# Patient Record
Sex: Male | Born: 1955 | Race: Black or African American | Hispanic: No | Marital: Married | Smoking: Never smoker
Health system: Southern US, Community
[De-identification: ages and names within clinical notes are randomized; demographics above are authoritative.]

## PROBLEM LIST (undated history)

## (undated) DIAGNOSIS — E119 Type 2 diabetes mellitus without complications: Secondary | ICD-10-CM

## (undated) DIAGNOSIS — E785 Hyperlipidemia, unspecified: Secondary | ICD-10-CM

## (undated) DIAGNOSIS — I10 Essential (primary) hypertension: Secondary | ICD-10-CM

## (undated) DIAGNOSIS — Z800 Family history of malignant neoplasm of digestive organs: Secondary | ICD-10-CM

## (undated) DIAGNOSIS — Z973 Presence of spectacles and contact lenses: Secondary | ICD-10-CM

## (undated) DIAGNOSIS — I700 Atherosclerosis of aorta: Secondary | ICD-10-CM

## (undated) DIAGNOSIS — J189 Pneumonia, unspecified organism: Secondary | ICD-10-CM

## (undated) DIAGNOSIS — E669 Obesity, unspecified: Secondary | ICD-10-CM

## (undated) HISTORY — DX: Atherosclerosis of aorta: I70.0

## (undated) HISTORY — DX: Family history of malignant neoplasm of digestive organs: Z80.0

## (undated) HISTORY — DX: Presence of spectacles and contact lenses: Z97.3

## (undated) HISTORY — DX: Essential (primary) hypertension: I10

## (undated) HISTORY — PX: PROSTATE BIOPSY: SHX241

## (undated) HISTORY — DX: Pneumonia, unspecified organism: J18.9

## (undated) HISTORY — DX: Obesity, unspecified: E66.9

## (undated) HISTORY — PX: OTHER SURGICAL HISTORY: SHX169

---

## 2012-06-07 ENCOUNTER — Ambulatory Visit
Admission: RE | Admit: 2012-06-07 | Discharge: 2012-06-07 | Disposition: A | Discharge status: Home / Self Care | Payer: BC Managed Care – PPO | Source: Ambulatory Visit | Attending: Medical | Admitting: Medical

## 2012-06-07 ENCOUNTER — Ambulatory Visit (INDEPENDENT_AMBULATORY_CARE_PROVIDER_SITE_OTHER): Payer: BC Managed Care – PPO | Admitting: Medical

## 2012-06-07 ENCOUNTER — Encounter: Payer: Self-pay | Admitting: Medical

## 2012-06-07 ENCOUNTER — Other Ambulatory Visit: Payer: Self-pay | Admitting: Medical

## 2012-06-07 ENCOUNTER — Encounter: Payer: Self-pay | Admitting: Gastroenterology

## 2012-06-07 VITALS — BP 122/88 | HR 77 | Temp 98.0°F | Resp 16 | Ht 73.5 in | Wt 352.0 lb

## 2012-06-07 DIAGNOSIS — R05 Cough: Secondary | ICD-10-CM

## 2012-06-07 DIAGNOSIS — Z111 Encounter for screening for respiratory tuberculosis: Secondary | ICD-10-CM

## 2012-06-07 DIAGNOSIS — Z125 Encounter for screening for malignant neoplasm of prostate: Secondary | ICD-10-CM

## 2012-06-07 DIAGNOSIS — Z6841 Body Mass Index (BMI) 40.0 and over, adult: Secondary | ICD-10-CM | POA: Insufficient documentation

## 2012-06-07 DIAGNOSIS — R918 Other nonspecific abnormal finding of lung field: Secondary | ICD-10-CM

## 2012-06-07 DIAGNOSIS — Z0000 Encounter for general adult medical examination without abnormal findings: Secondary | ICD-10-CM

## 2012-06-07 DIAGNOSIS — J189 Pneumonia, unspecified organism: Secondary | ICD-10-CM

## 2012-06-07 DIAGNOSIS — Z23 Encounter for immunization: Secondary | ICD-10-CM

## 2012-06-07 DIAGNOSIS — Z1211 Encounter for screening for malignant neoplasm of colon: Secondary | ICD-10-CM

## 2012-06-07 DIAGNOSIS — Z800 Family history of malignant neoplasm of digestive organs: Secondary | ICD-10-CM

## 2012-06-07 LAB — POCT URINALYSIS DIPSTICK
Bilirubin, UA: NEGATIVE
Glucose, UA: NEGATIVE
Ketones, UA: NEGATIVE
Spec Grav, UA: 1.015

## 2012-06-07 LAB — CBC WITH DIFFERENTIAL/PLATELET
Hemoglobin: 14.2 g/dL (ref 13.0–17.0)
Lymphocytes Relative: 39 % (ref 12–46)
Lymphs Abs: 2 10*3/uL (ref 0.7–4.0)
Monocytes Relative: 10 % (ref 3–12)
Neutrophils Relative %: 49 % (ref 43–77)
Platelets: 287 10*3/uL (ref 150–400)
RBC: 4.8 MIL/uL (ref 4.22–5.81)
WBC: 5 10*3/uL (ref 4.0–10.5)

## 2012-06-07 MED ORDER — CEFTRIAXONE SODIUM 1 G IJ SOLR
1.0000 g | Freq: Once | INTRAMUSCULAR | Status: AC
  Administered 2012-06-07: 1 g via INTRAMUSCULAR

## 2012-06-07 MED ORDER — LEVOFLOXACIN 500 MG PO TABS: 500.0000 mg | ORAL_TABLET | Freq: Every day | ORAL | Status: DC

## 2012-06-07 NOTE — Progress Notes (Signed)
Subjective:   HPI  [name] is a 57 y.o. male who presents for a complete physical.  New patient today.  Medical care team includes:  Primary care and opthalmology and dentist   Preventative care: Last ophthalmology visit: 2013, wear glasses Last dental visit: YES within a year Last colonoscopy: N A Last prostate exam: 2013 Last EKG:/A Last labs:n/a  Prior vaccinations: TD or Tdap:N/A Influenza:UNSURE Pneumococcal:n/a Shingles/Zostavax:n/a Other: n/a  Advanced directive:n/a Health care power of attorney:n/a Living will:n/a  Concerns: Been a foster parent 5 years.  No prior hx/o mental illness, no prior treatment for depression, no hx/o drug abuse.   Has foster form needs completing.  Denies any problems physically, mentally or emotionally that would limit his ability to take care of children.   Exercises with walking, 2mi daily, no CP, DOE, SOB.  No edema.    He reports recent illness. Thinks he had touch of the flu.  Feels 90% better.  Started 2 weeks ago.   Still having moderate cough.  No fever, no SOB, no wheezing, no sore throat, ear pain, NVD.  Using theraflu and tylenol.   Reviewed their medical, surgical, family, social, medication, and allergy history and updated chart as appropriate.   Past Medical History  Diagnosis Date  . Obesity     has been up to 400lb  . Hypertension     previous HTN with 400lb weight, not currently 05/2012  . Wears glasses     History reviewed. No pertinent past surgical history.  Family History  Problem Relation Age of Onset  . Cancer Mother 60    metastases  . Obesity Mother   . Cancer Father 15    died of colon cancer  . Liver disease Father   . Alcohol abuse Father   . Heart disease Neg Hx   . Hypertension Neg Hx   . Hyperlipidemia Neg Hx   . Stroke Neg Hx   . Diabetes Neg Hx     History   Social History  . Marital Status: Married    Spouse Name: N/A    Number of Children: N/A  . Years of Education: N/A    Occupational History  . Not on file.   Social History Main Topics  . Smoking status: Never Smoker   . Smokeless tobacco: Not on file  . Alcohol Use: Yes     Comment: occasional only  . Drug Use: No  . Sexually Active: Not on file   Other Topics Concern  . Not on file   Social History Narrative   Married, 21yo son, has 2 adopted children (special needs), and 1 foster child in the home currently. 3 children in the home currently.   Exercise with walking most mornings, walks 2 miles at a time typically    No current outpatient prescriptions on file prior to visit.    No Known Allergies   Review of Systems Constitutional: -fever, -chills, -sweats, -unexpected weight change, -decreased appetite, -fatigue Allergy: -sneezing, -itching, -congestion Dermatology: -changing moles, --rash, -lumps ENT: +runny nose, -ear pain, -sore throat, -hoarseness, -sinus pain, -teeth pain, - ringing in ears, -hearing loss, -nosebleeds Cardiology: -chest pain, -palpitations, -swelling, -difficulty breathing when lying flat, -waking up short of breath Respiratory: +cough, -shortness of breath, -difficulty breathing with exercise or exertion, -wheezing, -coughing up blood Gastroenterology: -abdominal pain, -nausea, -vomiting, -diarrhea, -constipation, -blood in stool, -changes in bowel movement, -difficulty swallowing or eating Hematology: -bleeding, -bruising  Musculoskeletal: -joint aches, -muscle aches, -joint swelling, -back pain, -  neck pain, -cramping, -changes in gait Ophthalmology: denies vision changes, eye redness, itching, discharge Urology: -burning with urination, -difficulty urinating, -blood in urine, -urinary frequency, -urgency, -incontinence Neurology: -headache, -weakness, -tingling, -numbness, -memory loss, -falls, -dizziness Psychology: -depressed mood, -agitation, -sleep problems     Objective:   Physical Exam  Filed Vitals:   06/07/12 0854  BP: 122/88  Pulse: 77  Temp: 98  F (36.7 C)  Resp: 16    General appearance: alert, no distress, WD/WN, AA male Skin:right forearm small scar from prior injury, right dorsal hand round 3mm slightly raised touch uniform lesion, unchanged for many years per pt HEENT: normocephalic, conjunctiva/corneas normal, sclerae anicteric, PERRLA, EOMi, nares patent, no discharge or erythema, pharynx normal Oral cavity: MMM, tongue normal, right upper with 1 molar and premolar in decay, rest of teeth in good repair Neck: supple, no lymphadenopathy, no thyromegaly, no masses, normal ROM, no bruits Chest: non tender, normal shape and expansion Heart: RRR, normal S1, S2, no murmurs Lungs: lower fields with crackles and decrease breath sounds, no decreased fremitus or egophony, no wheezes Abdomen: +bs, soft, non tender, non distended, no masses, no hepatomegaly, no splenomegaly, no bruits Back: non tender, normal ROM, no scoliosis Musculoskeletal: upper extremities non tender, no obvious deformity, normal ROM throughout, lower extremities non tender, no obvious deformity, normal ROM throughout Extremities: no edema, no cyanosis, no clubbing Pulses: 2+ symmetric, upper and lower extremities, normal cap refill Neurological: alert, oriented x 3, CN2-12 intact, strength normal upper extremities and lower extremities, sensation normal throughout, DTRs 2+ throughout, no cerebellar signs, gait normal Psychiatric: normal affect, behavior normal, pleasant  GU: normal male external genitalia, uncirc, nontender, no masses, no hernia, no lymphadenopathy Rectal: anus normal, prostate WNL, no nodules   Adult ECG Report  Indication: physical  Rate: 71bpm  Rhythm: normal sinus rhythm  QRS Axis: 8 degrees  PR Interval:  QRS Duration: 86ms  QTc:  Conduction Disturbances: none  Other Abnormalities: none  Patient's cardiac risk factors are: obesity (BMI >= 30 kg/m2).  EKG comparison: none  Narrative Interpretation: static baseline due to  cough, but otherwise normal EKG     Assessment and Plan :      Encounter Diagnoses  Name Primary?  . Routine general medical examination at a health care facility Yes  . Morbid obesity with BMI of 45.0-49.9, adult   . Need for Tdap vaccination   . Screen for colon cancer   . Screening for prostate cancer   . Family history of colon cancer   . PPD screening test   . Lung field abnormal finding on examination   . Cough     Physical exam - discussed healthy lifestyle, diet, exercise, preventative care, vaccinations, and addressed their concerns.  See eye doctor yearly, f/u with dentist soon for caries, repeat clean catch UA in 2wk.   Morbid obesity - discussed need to c/t efforts with diet and exercise to lose weight.   Discussed risks of obesity.  Gave info on 1800 calorie diet.  Consider Qsymia.  He will consider and let me know.  tdap vaccine, VIS and counseling given  Screen for colon cancer and family hx/o colon cancer - referral to GI for 1st colonoscopy  Screen for prostate cancer - discussed pros/cons of PSA testing and pt agrees to PSA today  PPD screening today.  Cough, abnormal lung field - send for CXR today.  94% pulse ox room air, abnormal lung fields.  Will complete foster care form  pending results of labs.    Follow-up pending labs  Addendum: chest X-ray 06/07/12 Bibasilar airspace disease, suspicious for pneumonia.    We had patient come back by for 1g IM Rocephin.  [facility] called out.  Recheck early next week.  Advised rest, hydration, call/return if not improving in the next few days.

## 2012-06-07 NOTE — Patient Instructions (Signed)
1800 Calorie Diet for Diabetes Meal Planning The 1800 calorie diet is designed for eating up to 1800 calories each day. Following this diet and making healthy meal choices can help improve overall health. This diet controls blood sugar (glucose) levels and can also help lower blood pressure and cholesterol.  SERVING SIZES Measuring foods and serving sizes helps to make sure you are getting the right amount of food. The list below tells how big or small some common serving sizes are:  1 oz.........4 stacked dice.  3 oz........Marland KitchenDeck of cards.  1 tsp.......Marland KitchenTip of little finger.  1 tbs......Marland KitchenMarland KitchenThumb.  2 tbs.......Marland KitchenGolf ball.   cup......Marland KitchenHalf of a fist.  1 cup.......Marland KitchenA fist.   GUIDELINES FOR CHOOSING FOODS The goal of this diet is to eat a variety of foods and limit calories to 1800 each day. This can be done by choosing foods that are low in calories and fat. The diet also suggests eating small amounts of food frequently. Doing this helps control your blood glucose levels so they do not get too high or too low. Each meal or snack may include a protein food source to help you feel more satisfied and to stabilize your blood glucose. Try to eat about the same amount of food around the same time each day. This includes weekend days, travel days, and days off work. Space your meals about 4 to 5 hours apart and add a snack between them if you wish.  For example, a daily food plan could include breakfast, a morning snack, lunch, dinner, and an evening snack. Healthy meals and snacks include whole grains, vegetables, fruits, lean meats, poultry, fish, and dairy products. As you plan your meals, select a variety of foods. Choose from the bread and starch, vegetable, fruit, dairy, and meat/protein groups. Examples of foods from each group and their suggested serving sizes are listed below. Use measuring cups and spoons to become familiar with what a healthy portion looks like. Bread and Starch Each  serving equals 15 grams of carbohydrates.  1 slice bread.   bagel.   cup cold cereal (unsweetened).   cup hot cereal or mashed potatoes.  1 small potato (size of a computer mouse).   cup cooked pasta or rice.   English muffin.  1 cup broth-based soup.  3 cups of popcorn.  4 to 6 whole-wheat crackers.   cup cooked beans, peas, or corn. Vegetable Each serving equals 5 grams of carbohydrates.   cup cooked vegetables.  1 cup raw vegetables.   cup tomato or vegetable juice. Fruit Each serving equals 15 grams of carbohydrates.  1 small apple or orange.  1 cup watermelon or strawberries.   cup applesauce (no sugar added).  2 tbs raisins.   banana.   cup canned fruit, packed in water, its own juice, or sweetened with a sugar substitute.   cup unsweetened fruit juice. Dairy Each serving equals 12 to 15 grams of carbohydrates.  1 cup fat-free milk.  6 oz artificially sweetened yogurt or plain yogurt.  1 cup low-fat buttermilk.  1 cup soy milk.  1 cup almond milk. Meat/Protein  1 large egg.  2 to 3 oz meat, poultry, or fish.   cup low-fat cottage cheese.  1 tbs peanut butter.  1 oz low-fat cheese.   cup tuna in water.   cup tofu. Fat  1 tsp oil.  1 tsp trans-fat-free margarine.  1 tsp butter.  1 tsp mayonnaise.  2 tbs avocado.  1 tbs salad dressing.  1  tbs cream cheese.  2 tbs sour cream.  SAMPLE 1800 CALORIE DIET PLAN Breakfast   cup unsweetened cereal (1 carb serving).  1 cup fat-free milk (1 carb serving).  1 slice whole-wheat toast (1 carb serving).   small banana (1 carb serving).  1 scrambled egg.  1 tsp trans-fat-free margarine. Lunch  Tuna sandwich.  2 slices whole-wheat bread (2 carb servings).   cup canned tuna in water, drained.  1 tbs reduced fat mayonnaise.  1 stalk celery, chopped.  2 slices tomato.  1 lettuce leaf.  1 cup carrot sticks.  24 to 30 seedless grapes (2 carb  servings).  6 oz light yogurt (1 carb serving). Afternoon Snack  3 graham cracker squares (1 carb serving).  Fat-free milk, 1 cup (1 carb serving).  1 tbs peanut butter. Dinner  3 oz salmon, broiled with 1 tsp oil.  1 cup mashed potatoes (2 carb servings) with 1 tsp trans-fat-free margarine.  1 cup fresh or frozen green beans.  1 cup steamed asparagus.  1 cup fat-free milk (1 carb serving). Evening Snack  3 cups air-popped popcorn (1 carb serving).  2 tbs parmesan cheese sprinkled on top.  MEAL PLAN Use this worksheet to help you make a daily meal plan based on the 1800 calorie diet suggestions. If you are using this plan to help you control your blood glucose, you may interchange carbohydrate-containing foods (dairy, starches, and fruits). Select a variety of fresh foods of varying colors and flavors. The total amount of carbohydrate in your meals or snacks is more important than making sure you include all of the food groups every time you eat. Choose from the following foods to build your day's meals:  8 Starches.  4 Vegetables.  3 Fruits.  2 Dairy.  6 to 7 oz Meat/Protein.  Up to 4 Fats.  Your dietician can use this worksheet to help you decide how many servings and which types of foods are right for you. BREAKFAST Food Group and Servings / Food Choice Starch ________________________________________________________ Dairy _________________________________________________________ Fruit _________________________________________________________ Meat/Protein __________________________________________________ Fat ___________________________________________________________ LUNCH Food Group and Servings / Food Choice Starch ________________________________________________________ Meat/Protein __________________________________________________ Vegetable _____________________________________________________ Fruit  _________________________________________________________ Dairy _________________________________________________________ Fat ___________________________________________________________ [name] Food Group and Servings / Food Choice Starch ________________________________________________________ Meat/Protein __________________________________________________ Fruit __________________________________________________________ Dairy _________________________________________________________ [name] Food Group and Servings / Food Choice Starch _________________________________________________________ Meat/Protein ___________________________________________________ Dairy __________________________________________________________ Vegetable ______________________________________________________ Fruit ___________________________________________________________ Fat ____________________________________________________________ [name] Food Group and Servings / Food Choice Fruit __________________________________________________________ Meat/Protein ___________________________________________________ Dairy __________________________________________________________ Starch _________________________________________________________ DAILY TOTALS Starch ____________________________ Vegetable _________________________ Fruit _____________________________ Dairy _____________________________ Meat/Protein______________________ Fat _______________________________ Document Released: 11/09/2004 Document Revised: 07/12/2011 Document Reviewed: 03/05/2011 Exit[employer] Patient Information 2013 [place], [place].   Preventative Care for Adults, Male       REGULAR HEALTH EXAMS:  A routine yearly physical is a good way to check in with your primary care provider about your health and preventive screening. It is also an opportunity to share updates about your health and any concerns you have, and receive a  thorough all-over exam.   Most health insurance companies pay for at least some preventative services.  Check with your health plan for specific coverages.  WHAT PREVENTATIVE SERVICES DO MEN NEED?  Adult men should have their weight and blood pressure checked regularly.   Men age 48 and older should have their cholesterol levels checked regularly.  Beginning at age 77 and continuing to age 60, men should be screened for colorectal cancer.  Certain people should may need  continued testing until age 56.  Other cancer screening may include exams for testicular and prostate cancer.  Updating vaccinations is part of preventative care.  Vaccinations help protect against diseases such as the flu.  Lab tests are generally done as part of preventative care to screen for anemia and blood disorders, to screen for problems with the kidneys and liver, to screen for bladder problems, to check blood sugar, and to check your cholesterol level.  Preventative services generally include counseling about diet, exercise, avoiding tobacco, drugs, excessive alcohol consumption, and sexually transmitted infections.    GENERAL RECOMMENDATIONS FOR GOOD HEALTH:  Healthy diet:  Eat a variety of foods, including fruit, vegetables, animal or vegetable protein, such as meat, fish, chicken, and eggs, or beans, lentils, tofu, and grains, such as rice.  Drink plenty of water daily.  Decrease saturated fat in the diet, avoid lots of red meat, processed foods, sweets, fast foods, and fried foods.  Exercise:  Aerobic exercise helps maintain good heart health. At least 30-40 minutes of moderate-intensity exercise is recommended. For example, a brisk walk that increases your heart rate and breathing. This should be done on most days of the week.   Find a type of exercise or a variety of exercises that you enjoy so that it becomes a part of your daily life.  Examples are running, walking, swimming, water aerobics, and  biking.  For motivation and support, explore group exercise such as aerobic class, spin class, Zumba, Yoga,or  martial arts, etc.    Set exercise goals for yourself, such as a certain weight goal, walk or run in a race such as a 5k walk/run.  Speak to your primary care provider about exercise goals.  Disease prevention:  If you smoke or chew tobacco, find out from your caregiver how to quit. It can literally save your life, no matter how long you have been a tobacco user. If you do not use tobacco, never begin.   Maintain a healthy diet and normal weight. Increased weight leads to problems with blood pressure and diabetes.   The Body Mass Index or BMI is a way of measuring how much of your body is fat. Having a BMI above 27 increases the risk of heart disease, diabetes, hypertension, stroke and other problems related to obesity. Your caregiver can help determine your BMI and based on it develop an exercise and dietary program to help you achieve or maintain this important measurement at a healthful level.  High blood pressure causes heart and blood vessel problems.  Persistent high blood pressure should be treated with medicine if weight loss and exercise do not work.   Fat and cholesterol leaves deposits in your arteries that can block them. This causes heart disease and vessel disease elsewhere in your body.  If your cholesterol is found to be high, or if you have heart disease or certain other medical conditions, then you may need to have your cholesterol monitored frequently and be treated with medication.   Ask if you should have a stress test if your history suggests this. A stress test is a test done on a treadmill that looks for heart disease. This test can find disease prior to there being a problem.  Avoid drinking alcohol in excess (more than two drinks per day).  Avoid use of street drugs. Do not share needles with anyone. Ask for professional help if you need assistance or instructions  on stopping the use of alcohol, cigarettes, and/or drugs.  Brush  your teeth twice a day with fluoride toothpaste, and floss once a day. Good oral hygiene prevents tooth decay and gum disease. The problems can be painful, unattractive, and can cause other health problems. Visit your dentist for a routine oral and dental check up and preventive care every 6-12 months.   Look at your skin regularly.  Use a mirror to look at your back. Notify your caregivers of changes in moles, especially if there are changes in shapes, colors, a size larger than a pencil eraser, an irregular border, or development of new moles.  Safety:  Use seatbelts 100% of the time, whether driving or as a passenger.  Use safety devices such as hearing protection if you work in environments with loud noise or significant background noise.  Use safety glasses when doing any work that could send debris in to the eyes.  Use a helmet if you ride a bike or motorcycle.  Use appropriate safety gear for contact sports.  Talk to your caregiver about gun safety.  Use sunscreen with a SPF (or skin protection factor) of 15 or greater.  Lighter skinned people are at a greater risk of skin cancer. Don't forget to also wear sunglasses in order to protect your eyes from too much damaging sunlight. Damaging sunlight can accelerate cataract formation.   Practice safe sex. Use condoms. Condoms are used for birth control and to help reduce the spread of sexually transmitted infections (or STIs).  Some of the STIs are gonorrhea (the clap), chlamydia, syphilis, trichomonas, herpes, HPV (human papilloma virus) and HIV (human immunodeficiency virus) which causes AIDS. The herpes, HIV and HPV are viral illnesses that have no cure. These can result in disability, cancer and death.   Keep carbon monoxide and smoke detectors in your home functioning at all times. Change the batteries every 6 months or use a model that plugs into the wall.    Vaccinations:  Stay up to date with your tetanus shots and other required immunizations. You should have a booster for tetanus every 10 years. Be sure to get your flu shot every year, since 5%-20% of the [place] population comes down with the flu. The flu vaccine changes each year, so being vaccinated once is not enough. Get your shot in the fall, before the flu season peaks.   Other vaccines to consider:  Pneumococcal vaccine to protect against certain types of pneumonia.  This is normally recommended for adults age 26 or older.  However, adults younger than 55 years old with certain underlying conditions such as diabetes, heart or lung disease should also receive the vaccine.  Shingles vaccine to protect against Varicella Zoster if you are older than age 79, or younger than 55 years old with certain underlying illness.  Hepatitis A vaccine to protect against a form of infection of the liver by a virus acquired from food.  Hepatitis B vaccine to protect against a form of infection of the liver by a virus acquired from blood or body fluids, particularly if you work in health care.  If you plan to travel internationally, check with your local health department for specific vaccination recommendations.  Cancer Screening:  Most routine colon cancer screening begins at the age of 60. On a yearly basis, doctors may provide special easy to use take-home tests to check for hidden blood in the stool. Sigmoidoscopy or colonoscopy can detect the earliest forms of colon cancer and is life saving. These tests use a small camera at the end of a  tube to directly examine the colon. Speak to your caregiver about this at age 64, when routine screening begins (and is repeated every 5 years unless early forms of pre-cancerous polyps or small growths are found).   At the age of 58 men usually start screening for prostate cancer every year. Screening may begin at a younger age for those with higher risk. Those at  higher risk include African-Americans or having a family history of prostate cancer. There are two types of tests for prostate cancer:   Prostate-specific antigen (PSA) testing. Recent studies raise questions about prostate cancer using PSA and you should discuss this with your caregiver.   Digital rectal exam (in which your doctor's lubricated and gloved finger feels for enlargement of the prostate through the anus).   Screening for testicular cancer.  Do a monthly exam of your testicles. Gently roll each testicle between your thumb and fingers, feeling for any abnormal lumps. The best time to do this is after a hot shower or bath when the tissues are looser. Notify your caregivers of any lumps, tenderness or changes in size or shape immediately.

## 2012-06-08 LAB — COMPREHENSIVE METABOLIC PANEL
ALT: 59 U/L — ABNORMAL HIGH (ref 0–53)
Albumin: 4 g/dL (ref 3.5–5.2)
Alkaline Phosphatase: 61 U/L (ref 39–117)
CO2: 27 mEq/L (ref 19–32)
Glucose, Bld: 98 mg/dL (ref 70–99)
Potassium: 4.1 mEq/L (ref 3.5–5.3)
Sodium: 139 mEq/L (ref 135–145)
Total Bilirubin: 1 mg/dL (ref 0.3–1.2)
Total Protein: 7.4 g/dL (ref 6.0–8.3)

## 2012-06-08 LAB — LIPID PANEL
Cholesterol: 125 mg/dL (ref 0–200)
LDL Cholesterol: 70 mg/dL (ref 0–99)
VLDL: 19 mg/dL (ref 0–40)

## 2012-06-08 LAB — PSA: PSA: 0.94 ng/mL (ref <=4.00)

## 2012-06-09 ENCOUNTER — Telehealth: Payer: Self-pay | Admitting: Family Medicine

## 2012-06-09 NOTE — Telephone Encounter (Signed)
Patient is aware of his appointment at [facility] on 07/11/12 @ 1000 am with Dr. Jarold Motto. CLS Nurse visit on 06/26/12 @ 1100 am. CLS   [phone]

## 2012-06-14 ENCOUNTER — Ambulatory Visit: Payer: BC Managed Care – PPO | Admitting: Medical

## 2012-06-16 ENCOUNTER — Ambulatory Visit (INDEPENDENT_AMBULATORY_CARE_PROVIDER_SITE_OTHER): Payer: BC Managed Care – PPO | Admitting: Family Medicine

## 2012-06-16 ENCOUNTER — Encounter: Payer: Self-pay | Admitting: Family Medicine

## 2012-06-16 ENCOUNTER — Ambulatory Visit: Payer: BC Managed Care – PPO | Admitting: Family Medicine

## 2012-06-16 VITALS — BP 130/84 | HR 110 | Temp 98.1°F

## 2012-06-16 DIAGNOSIS — J189 Pneumonia, unspecified organism: Secondary | ICD-10-CM

## 2012-06-16 NOTE — Progress Notes (Signed)
  Subjective:    Patient ID: [name], male    DOB: 1955-11-22, 57 y.o.   MRN: [id number]  HPI He is here for followup after recently being treated for pneumonia. He states that he is 95% better, only having slight difficulty with cough. No fever, chills, shortness of breath.   Review of Systems     Objective:   Physical Exam  Alert and in no distress. Lungs are clear to auscultation. Cardiac exam shows regular rhythm without murmurs or gallops.       Assessment & Plan:  CAP (community acquired pneumonia) he is to call me if his coughing does not go away completely.

## 2012-06-16 NOTE — Patient Instructions (Signed)
Your cough should completely go away and if it does not or you get worse, call us

## 2012-06-19 ENCOUNTER — Ambulatory Visit: Payer: BC Managed Care – PPO | Admitting: Family Medicine

## 2012-06-26 ENCOUNTER — Ambulatory Visit: Payer: BC Managed Care – PPO | Admitting: *Deleted

## 2012-06-26 VITALS — Ht 75.0 in | Wt 370.0 lb

## 2012-06-26 DIAGNOSIS — Z1211 Encounter for screening for malignant neoplasm of colon: Secondary | ICD-10-CM

## 2012-06-26 NOTE — Progress Notes (Signed)
Pt is 370 lbs.  Per Dr. Jarold Motto, pt is to see him in the office first

## 2012-07-03 ENCOUNTER — Encounter: Payer: Self-pay | Admitting: *Deleted

## 2012-07-11 ENCOUNTER — Encounter: Payer: BC Managed Care – PPO | Admitting: Gastroenterology

## 2012-07-12 ENCOUNTER — Encounter: Payer: Self-pay | Admitting: Gastroenterology

## 2012-07-12 ENCOUNTER — Other Ambulatory Visit (INDEPENDENT_AMBULATORY_CARE_PROVIDER_SITE_OTHER): Payer: BC Managed Care – PPO

## 2012-07-12 ENCOUNTER — Ambulatory Visit (INDEPENDENT_AMBULATORY_CARE_PROVIDER_SITE_OTHER): Payer: BC Managed Care – PPO | Admitting: Gastroenterology

## 2012-07-12 VITALS — BP 130/84 | HR 81 | Ht 73.0 in | Wt 364.0 lb

## 2012-07-12 DIAGNOSIS — R160 Hepatomegaly, not elsewhere classified: Secondary | ICD-10-CM

## 2012-07-12 DIAGNOSIS — R109 Unspecified abdominal pain: Secondary | ICD-10-CM

## 2012-07-12 DIAGNOSIS — Z1211 Encounter for screening for malignant neoplasm of colon: Secondary | ICD-10-CM

## 2012-07-12 LAB — FERRITIN: Ferritin: 210 ng/mL (ref 22.0–322.0)

## 2012-07-12 LAB — IBC PANEL
Iron: 96 ug/dL (ref 42–165)
Saturation Ratios: 26.6 % (ref 20.0–50.0)

## 2012-07-12 LAB — FOLATE: Folate: 10.7 ng/mL (ref >5.9)

## 2012-07-12 NOTE — Patient Instructions (Addendum)
  We will call you back once we hear back about scheduling your colonoscopy at the hospital. Once you are scheduled for the procedure you will have to come back to the office to go over your instructions for your procedure. It is a free visit and only takes about 30 minutes.  Your physician has requested that you go to the basement for lab work before leaving today.  ____________________________________________________________________________________________________________________  [name] have been scheduled for an abdominal ultrasound at [facility] (1st floor of hospital) on 07-13-2012 at 8:30 am. Please arrive 15 minutes prior to your appointment for registration. Make certain not to have anything to eat or drink 6 hours prior to your appointment. Should you need to reschedule your appointment, please contact radiology at [phone]. This test typically takes about 30 minutes to perform. ____________________________________________________________________________________________________________________                                               We are excited to introduce MyChart, a new best-in-class service that provides you online access to important information in your electronic medical record. We want to make it easier for you to view your health information - all in one secure location - when and where you need it. We expect MyChart will enhance the quality of care and service we provide.  When you register for MyChart, you can:    View your test results.    Request appointments and receive appointment reminders via email.    Request medication renewals.    View your medical history, allergies, medications and immunizations.    Communicate with your physician's office through a password-protected site.    Conveniently print information such as your medication lists.  To find out if MyChart is right for you, please talk to a member of our clinical staff today. We will  gladly answer your questions about this free health and wellness tool.  If you are age 55 or older and want a member of your family to have access to your record, you must provide written consent by completing a proxy form available at our office. Please speak to our clinical staff about guidelines regarding accounts for patients younger than age 48.  As you activate your MyChart account and need any technical assistance, please call the MyChart technical support line at [phone]) or email your question to [email or web address]. If you email your question(s), please include your name, a return phone number and the best time to reach you.  If you have non-urgent health-related questions, you can send a message to our office through MyChart at [email or web address]. If you have a medical emergency, call 911.  Thank you for using MyChart as your new health and wellness resource!   MyChart licensed from [employer],  6578-4696. Patents Pending.

## 2012-07-12 NOTE — Progress Notes (Signed)
History of Present Illness:  This is a pleasant 57 year old [place] American male referred for screening colonoscopy.  He denies any GI complaints, and has regular daily bowel movements without melena or hematochezia.  Review of recent labs from primary care shows normal CBC and metabolic profile except for SGOT of 60 and SGPT 59.  Patient has had previous tattoos but denies a known history of liver disease, hepatitis or pancreatitis.  He suffers from morbid obesity for many years.  Recent EKG also was normal.  He has not had previous endoscopy or colonoscopy.  Denies cardiovascular pulmonary complaints and has good exercise tolerance.  He denies abuse of alcohol or cigarettes.  I have reviewed this patient's present history, medical and surgical past history, allergies and medications.     ROS:   All systems were reviewed and are negative unless otherwise stated in the HPI.    Physical Exam: Blood pressure 122/88, pulse 77 and regular, and weight 352 pounds the BMI of 45.81.  94% oxygen saturation at room air. General well developed well nourished patient in no acute distress, appearing their stated age Eyes PERRLA, no icterus, fundoscopic exam per opthamologist Skin no lesions noted Neck supple, no adenopathy, no thyroid enlargement, no tenderness Chest clear to percussion and auscultation Heart no significant murmurs, gallops or rubs noted Abdomen no hepatosplenomegaly masses or tenderness, BS normal.  Abdominal exam is difficult has massive obesity, but I do think he has mild hepatomegaly without any nodularity or tenderness.  Extremities no acute joint lesions, edema, phlebitis or evidence of cellulitis. Neurologic patient oriented x 3, cranial nerves intact, no focal neurologic deficits noted. Psychological mental status normal and normal affect.  Assessment and plan: Screening colonoscopy scheduled with Dr. Martina Sinner and and in a hospital setting because of his massive morbid obesity.  I've  scheduled him for upper abdominal ultrasound, and we'll check screening labs for previous hepatitis or metabolic liver disease.  This patient should probably consider bariatric surgery for his morbid obesity.  Please copy her primary care physician, referring physician, and pertinent subspecialists.Marland KitchenMarland KitchenMoses Manners and Dr. Ruthe Mannan  Encounter Diagnoses  Name Primary?  . Abdominal pain, unspecified site Yes  . Enlarged liver

## 2012-07-13 ENCOUNTER — Ambulatory Visit (HOSPITAL_COMMUNITY)
Admission: RE | Admit: 2012-07-13 | Discharge: 2012-07-13 | Disposition: A | Discharge status: Home / Self Care | Payer: BC Managed Care – PPO | Source: Ambulatory Visit | Attending: Gastroenterology | Admitting: Gastroenterology

## 2012-07-13 DIAGNOSIS — R160 Hepatomegaly, not elsewhere classified: Secondary | ICD-10-CM

## 2012-07-13 DIAGNOSIS — R109 Unspecified abdominal pain: Secondary | ICD-10-CM | POA: Insufficient documentation

## 2012-07-31 ENCOUNTER — Telehealth: Payer: Self-pay | Admitting: Gastroenterology

## 2012-07-31 ENCOUNTER — Other Ambulatory Visit: Payer: Self-pay | Admitting: Gastroenterology

## 2012-07-31 DIAGNOSIS — Z1211 Encounter for screening for malignant neoplasm of colon: Secondary | ICD-10-CM

## 2012-07-31 MED ORDER — PEG-KCL-NACL-NASULF-NA ASC-C 100 G PO SOLR: 1.0000 | Freq: Once | ORAL | Status: DC

## 2012-07-31 NOTE — Telephone Encounter (Signed)
Spoke to pt. Told him colonoscopy is scheduled with Dr. Rhea Belton on 09/01/2012 @ 8:30am at [facility] we went over instructions and told him to pick up his prep i sent in to his pharmacy and when he receives the instructions in the mail to call me back with any questions he may have. Pt stated understadning.

## 2012-08-14 ENCOUNTER — Telehealth: Payer: Self-pay | Admitting: Gastroenterology

## 2012-08-14 NOTE — Telephone Encounter (Deleted)
Pt called in to let me know he went to pick up his Prep for his colonoscopy scheduled for 09/01/2012 @

## 2012-08-14 NOTE — Telephone Encounter (Signed)
Pt called in to tell me he could not afford the prep for his colonoscopy that is scheduled on 09/01/2012 @ [facility] with Dr. Rhea Belton.  told him I could give him a coupon for a free prep. He was very appreciative and said he would be by tomorrow to pick it up, I told him I will leave it at our front desk.

## 2012-08-15 ENCOUNTER — Encounter (HOSPITAL_COMMUNITY): Payer: Self-pay | Admitting: *Deleted

## 2012-08-31 ENCOUNTER — Telehealth: Payer: Self-pay | Admitting: Gastroenterology

## 2012-08-31 NOTE — Telephone Encounter (Signed)
Pt called and cancelled his colonoscopy scheduled at [facility] for 09/01/2012 because he forgot to stop eating salads and nuts. Pt stated he is on a diet and that's is all he eats. I told him since he will be on clears all today and starting his prep this evening that it will be fine. Pt said "no" he wants to do this right and will reschedule. I told him I'll call him back to rescheduled based on Dr. Sherald Barge hospital schedule. Pt verbalized understanding

## 2012-09-01 ENCOUNTER — Ambulatory Visit (HOSPITAL_COMMUNITY)
Admission: RE | Admit: 2012-09-01 | Payer: BC Managed Care – PPO | Source: Ambulatory Visit | Admitting: Internal Medicine

## 2012-09-01 SURGERY — COLONOSCOPY
Anesthesia: Monitor Anesthesia Care

## 2014-04-11 ENCOUNTER — Ambulatory Visit (INDEPENDENT_AMBULATORY_CARE_PROVIDER_SITE_OTHER): Payer: BC Managed Care – PPO | Admitting: Medical

## 2014-04-11 ENCOUNTER — Encounter: Payer: Self-pay | Admitting: Medical

## 2014-04-11 VITALS — BP 190/110 | HR 78 | Temp 97.8°F | Resp 16 | Ht 75.0 in | Wt 380.0 lb

## 2014-04-11 DIAGNOSIS — Z125 Encounter for screening for malignant neoplasm of prostate: Secondary | ICD-10-CM

## 2014-04-11 DIAGNOSIS — I10 Essential (primary) hypertension: Secondary | ICD-10-CM

## 2014-04-11 DIAGNOSIS — Z23 Encounter for immunization: Secondary | ICD-10-CM

## 2014-04-11 DIAGNOSIS — K029 Dental caries, unspecified: Secondary | ICD-10-CM

## 2014-04-11 DIAGNOSIS — Z1211 Encounter for screening for malignant neoplasm of colon: Secondary | ICD-10-CM

## 2014-04-11 DIAGNOSIS — E786 Lipoprotein deficiency: Secondary | ICD-10-CM

## 2014-04-11 DIAGNOSIS — Z0000 Encounter for general adult medical examination without abnormal findings: Secondary | ICD-10-CM

## 2014-04-11 LAB — HEPATIC FUNCTION PANEL
ALBUMIN: 4.2 g/dL (ref 3.5–5.2)
ALT: 19 U/L (ref 0–53)
AST: 25 U/L (ref 0–37)
Alkaline Phosphatase: 73 U/L (ref 39–117)
BILIRUBIN INDIRECT: 0.5 mg/dL (ref 0.2–1.2)
Bilirubin, Direct: 0.2 mg/dL (ref 0.0–0.3)
TOTAL PROTEIN: 7.6 g/dL (ref 6.0–8.3)
Total Bilirubin: 0.7 mg/dL (ref 0.2–1.2)

## 2014-04-11 LAB — CBC WITH DIFFERENTIAL/PLATELET
BASOS ABS: 0 10*3/uL (ref 0.0–0.1)
BASOS PCT: 0 % (ref 0–1)
EOS ABS: 0.1 10*3/uL (ref 0.0–0.7)
EOS PCT: 2 % (ref 0–5)
HCT: 39.5 % (ref 39.0–52.0)
HEMOGLOBIN: 13.9 g/dL (ref 13.0–17.0)
LYMPHS ABS: 2.5 10*3/uL (ref 0.7–4.0)
Lymphocytes Relative: 41 % (ref 12–46)
MCH: 30.2 pg (ref 26.0–34.0)
MCHC: 35.2 g/dL (ref 30.0–36.0)
MCV: 85.7 fL (ref 78.0–100.0)
MPV: 9.7 fL (ref 9.4–12.4)
Monocytes Absolute: 0.5 10*3/uL (ref 0.1–1.0)
Monocytes Relative: 8 % (ref 3–12)
NEUTROS PCT: 49 % (ref 43–77)
Neutro Abs: 3 10*3/uL (ref 1.7–7.7)
PLATELETS: 257 10*3/uL (ref 150–400)
RBC: 4.61 MIL/uL (ref 4.22–5.81)
RDW: 13.3 % (ref 11.5–15.5)
WBC: 6.1 10*3/uL (ref 4.0–10.5)

## 2014-04-11 LAB — POCT URINALYSIS DIPSTICK
Bilirubin, UA: NEGATIVE
Glucose, UA: NEGATIVE
KETONES UA: NEGATIVE
Leukocytes, UA: NEGATIVE
Nitrite, UA: NEGATIVE
PROTEIN UA: NEGATIVE
RBC UA: NEGATIVE
pH, UA: 6

## 2014-04-11 LAB — BASIC METABOLIC PANEL
BUN: 15 mg/dL (ref 6–23)
CHLORIDE: 106 meq/L (ref 96–112)
CO2: 24 meq/L (ref 19–32)
CREATININE: 0.96 mg/dL (ref 0.50–1.35)
Calcium: 9.4 mg/dL (ref 8.4–10.5)
Glucose, Bld: 91 mg/dL (ref 70–99)
Potassium: 4.2 mEq/L (ref 3.5–5.3)
Sodium: 138 mEq/L (ref 135–145)

## 2014-04-11 LAB — LIPID PANEL
CHOL/HDL RATIO: 2.9 ratio
Cholesterol: 128 mg/dL (ref 0–200)
HDL: 44 mg/dL (ref >39)
LDL CALC: 71 mg/dL (ref 0–99)
TRIGLYCERIDES: 66 mg/dL (ref <150)
VLDL: 13 mg/dL (ref 0–40)

## 2014-04-11 LAB — TSH: TSH: 1.959 u[IU]/mL (ref 0.350–4.500)

## 2014-04-11 NOTE — Progress Notes (Signed)
Subjective:   HPI  Ryan O Drewes [name] is a 58 y.o. male who presents for a complete physical.   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   Preventative care:2 years ago, here Last ophthalmology visit:2 years ago Last dental visit:Dr. Cooper 2014 Last colonoscopy:never Last prostate exam: 2 years ago Last EKG:unknown Last labs:2 years   Prior vaccinations: TD or Tdap: up to date Influenza:would like to have it today Pneumococcal:no Shingles/Zostavax:no  Advanced directive no Health care power of attorney no Living will:no  Concerns: No concerns other than he knows he needs to lose weight  Reviewed their medical, surgical, family, social, medication, and allergy history and updated chart as appropriate.  Past Medical History  Diagnosis Date  . Obesity     has been up to 400lb  . Wears glasses   . Pneumonia   . Family history of colon cancer     father -questionable  . Hypertension     previous HTN with 400lb weight    Past Surgical History  Procedure Laterality Date  . Unremarkable      History   Social History  . Marital Status: Married    Spouse Name: N/A    Number of Children: N/A  . Years of Education: N/A   Occupational History  . Not on file.   Social History Main Topics  . Smoking status: Never Smoker   . Smokeless tobacco: Never Used  . Alcohol Use: Yes     Comment: occasional only  . Drug Use: No  . Sexual Activity: Not on file   Other Topics Concern  . Not on file   Social History Narrative   Married, son in college, has 5 adopted children, some special needs ,some planning to adopt, Exercise with walking most mornings, walks 2 miles at a time typically. Works at [employer].    Family History  Problem Relation Age of Onset  . Cancer Mother 60    metastases-unaware as to type of ca  . Obesity Mother   . Colon cancer Father 60    questionable  . Liver disease Father   . Alcohol abuse Father   . Heart  disease Neg Hx   . Hypertension Neg Hx   . Hyperlipidemia Neg Hx   . Stroke Neg Hx   . Diabetes Neg Hx   . Esophageal cancer Neg Hx   . Rectal cancer Neg Hx     Current outpatient prescriptions: peg 3350 powder (MOVIPREP) 100 G SOLR, Take 1 kit (100 g total) by mouth once. (Patient not taking: Reported on 04/11/2014), Disp: 1 kit, Rfl: 0  No Known Allergies     Review of Systems Constitutional: -fever, -chills, -sweats, -unexpected weight change, -decreased appetite, -fatigue Allergy: -sneezing, -itching, -congestion Dermatology: -changing moles, --rash, -lumps ENT: -runny nose, -ear pain, -sore throat, -hoarseness, -sinus pain, -teeth pain, - ringing in ears, -hearing loss, -nosebleeds Cardiology: -chest pain, -palpitations, -swelling, -difficulty breathing when lying flat, -waking up short of breath Respiratory: -cough, -shortness of breath, -difficulty breathing with exercise or exertion, -wheezing, -coughing up blood Gastroenterology: -abdominal pain, -nausea, -vomiting, -diarrhea, -constipation, -blood in stool, -changes in bowel movement, -difficulty swallowing or eating Hematology: -bleeding, -bruising  Musculoskeletal: -joint aches, -muscle aches, -joint swelling, -back pain, -neck pain, -cramping, -changes in gait Ophthalmology: denies vision changes, eye redness, itching, discharge Urology: -burning with urination, -difficulty urinating, -blood in urine, -urinary frequency, -urgency, -incontinence Neurology: -headache, -weakness, -tingling, -numbness, -memory loss, -falls, -dizziness Psychology: -depressed mood, -  agitation, -sleep problems     Objective:   Physical Exam  BP 190/110 mmHg  Pulse 78  Temp(Src) 97.8 F (36.6 C) (Oral)  Resp 16  Ht 6' 3" (1.905 m)  Wt 380 lb (172.367 kg)  BMI 47.50 kg/m2  BP Readings from Last 3 Encounters:  04/11/14 190/110  07/12/12 130/84  06/16/12 130/84   Wt Readings from Last 3 Encounters:  04/11/14 380 lb (172.367 kg)   07/12/12 364 lb (165.109 kg)  06/26/12 370 lb (167.831 kg)   General appearance: alert, no distress, WD/WN, AA male Skin:right forearm small scar from prior injury, right dorsal hand round 59m slightly raised uniform lesion, unchanged for many years per pt HEENT: normocephalic, conjunctiva/corneas normal, sclerae anicteric, PERRLA, EOMi, nares patent, no discharge or erythema, pharynx normal Oral cavity: MMM, tongue normal, several teeth with decay Neck: supple, no lymphadenopathy, no thyromegaly, no masses, normal ROM, no bruits Chest: non tender, normal shape and expansion Heart: RRR, normal S1, S2, no murmurs Lungs: clear Abdomen: +bs, soft, non tender, non distended, no masses, no hepatomegaly, no splenomegaly, no bruits Back: non tender, normal ROM, no scoliosis Musculoskeletal: upper extremities non tender, no obvious deformity, normal ROM throughout, lower extremities non tender, no obvious deformity, normal ROM throughout Extremities: no edema, no cyanosis, no clubbing Pulses: 2+ symmetric, upper and lower extremities, normal cap refill Neurological: alert, oriented x 3, CN2-12 intact, strength normal upper extremities and lower extremities, sensation normal throughout, DTRs 2+ throughout, no cerebellar signs, gait normal Psychiatric: normal affect, behavior normal, pleasant  GU: normal male external genitalia, uncirc, nontender, no masses, no hernia, no lymphadenopathy Rectal: deferred  Assessment and Plan :    Encounter Diagnoses  Name Primary?  . Annual physical exam Yes  . Morbid obesity   . Essential hypertension   . Low HDL (under 40)   . Need for prophylactic vaccination and inoculation against influenza   . Screening for prostate cancer   . Special screening for malignant neoplasms, colon   . Tooth decay     Physical exam - discussed healthy lifestyle, diet, exercise, preventative care, vaccinations, and addressed their concerns.   See your eye doctor yearly for  routine vision care. See your dentist yearly for routine dental care including hygiene visits twice yearly. Morbid obesity - stressed the need for lifestyle chagnes, better efforts.   Will refer to nutritionist HTN - pending labs will being medication.   Discussed diagnosis, dangers of uncontrolled hypertesiuon Low HDL - labs today Counseled on the influenza virus vaccine.  Vaccine information sheet given.  Influenza vaccine given after consent obtained. PSA test today He declined referral to colonoscopy until after first of the year.  Follow-up pending labs

## 2014-04-12 LAB — MICROALBUMIN / CREATININE URINE RATIO
Creatinine, Urine: 214.5 mg/dL
MICROALB UR: 0.8 mg/dL (ref <2.0)
Microalb Creat Ratio: 3.7 mg/g (ref 0.0–30.0)

## 2014-04-12 LAB — HEMOGLOBIN A1C
Hgb A1c MFr Bld: 5.9 % — ABNORMAL HIGH (ref <5.7)
Mean Plasma Glucose: 123 mg/dL — ABNORMAL HIGH (ref <117)

## 2014-04-13 LAB — PSA: PSA: 2.79 ng/mL (ref <=4.00)

## 2014-04-15 ENCOUNTER — Other Ambulatory Visit: Payer: Self-pay | Admitting: Medical

## 2014-04-15 MED ORDER — LOSARTAN POTASSIUM-HCTZ 50-12.5 MG PO TABS: 1.0000 | ORAL_TABLET | Freq: Every day | ORAL | Status: DC

## 2014-05-30 ENCOUNTER — Ambulatory Visit: Payer: Self-pay | Admitting: Medical

## 2014-12-27 ENCOUNTER — Telehealth: Payer: Self-pay | Admitting: Medical

## 2014-12-27 ENCOUNTER — Other Ambulatory Visit: Payer: Self-pay | Admitting: Medical

## 2014-12-27 MED ORDER — LOSARTAN POTASSIUM-HCTZ 50-12.5 MG PO TABS: 1.0000 | ORAL_TABLET | Freq: Every day | ORAL | Status: DC

## 2014-12-27 NOTE — Telephone Encounter (Signed)
Pt called and made an appt for bp follow up. Please refill bp meds to [address]

## 2014-12-27 NOTE — Telephone Encounter (Signed)
done

## 2015-01-07 ENCOUNTER — Ambulatory Visit (INDEPENDENT_AMBULATORY_CARE_PROVIDER_SITE_OTHER): Payer: BLUE CROSS/BLUE SHIELD | Admitting: Medical

## 2015-01-07 ENCOUNTER — Encounter: Payer: Self-pay | Admitting: Medical

## 2015-01-07 VITALS — BP 150/100 | HR 76 | Temp 97.9°F | Resp 18 | Wt 385.4 lb

## 2015-01-07 DIAGNOSIS — R972 Elevated prostate specific antigen [PSA]: Secondary | ICD-10-CM

## 2015-01-07 DIAGNOSIS — E786 Lipoprotein deficiency: Secondary | ICD-10-CM | POA: Diagnosis not present

## 2015-01-07 DIAGNOSIS — I10 Essential (primary) hypertension: Secondary | ICD-10-CM | POA: Diagnosis not present

## 2015-01-07 DIAGNOSIS — Z6841 Body Mass Index (BMI) 40.0 and over, adult: Secondary | ICD-10-CM

## 2015-01-07 LAB — BASIC METABOLIC PANEL
BUN: 19 mg/dL (ref 7–25)
CALCIUM: 9.9 mg/dL (ref 8.6–10.3)
CO2: 28 mmol/L (ref 20–31)
Chloride: 100 mmol/L (ref 98–110)
Creat: 1.06 mg/dL (ref 0.70–1.33)
Glucose, Bld: 87 mg/dL (ref 65–99)
Potassium: 4.7 mmol/L (ref 3.5–5.3)
SODIUM: 139 mmol/L (ref 135–146)

## 2015-01-07 MED ORDER — PHENTERMINE-TOPIRAMATE ER 3.75-23 MG PO CP24: 1.0000 | ORAL_CAPSULE | ORAL | Status: DC

## 2015-01-07 MED ORDER — PHENTERMINE-TOPIRAMATE ER 7.5-46 MG PO CP24: 1.0000 | ORAL_CAPSULE | ORAL | Status: DC

## 2015-01-07 MED ORDER — PRAVASTATIN SODIUM 20 MG PO TABS: 20.0000 mg | ORAL_TABLET | Freq: Every day | ORAL | Status: DC

## 2015-01-07 MED ORDER — ASPIRIN EC 81 MG PO TBEC: 81.0000 mg | DELAYED_RELEASE_TABLET | Freq: Every day | ORAL | Status: DC

## 2015-01-07 MED ORDER — LOSARTAN POTASSIUM-HCTZ 100-12.5 MG PO TABS: 1.0000 | ORAL_TABLET | Freq: Every day | ORAL | Status: DC

## 2015-01-07 NOTE — Patient Instructions (Signed)
Encounter Diagnoses  Name Primary?  . Essential hypertension Yes  . Low HDL (under 40)   . Morbid obesity with BMI of 45.0-49.9, adult   . Abnormal PSA     Recommendations:  We will refer you for nutrition counseling  Continue Losartan HCT, but I increased the dose  Begin Pravachol and baby aspirin daily at bedtime to lower cholesterol and thus lower heart disease risk  Diet  Increase your water intake, get at least 64 ounces of water daily  Eat 3-4 fruits daily  Eat plenty of vegetables throughout the day, preferably each meal  Eat good sources of grains such as oatmeal, barley, whole grain pasta, whole grain bread, but limit the serving size to 1 cup of oatmeal or pasta per meal or 2 slices of bread per meal  We don't need to meat at each meal, however if you do eat meat, limit serving size to the size of your palm, and eat chicken fish or [place], lean cuts of meat  Eat beans every day as this is a good nutrient source and helps to curb appetite  Consider using a program such as Weight Watchers  Consider using a Smart phone app such as My Fitness PAL or [employer] to track your calories and progress   Things to limit or avoid:  Avoid fast food, fried foods, fatty foods  Limit sweets, ice cream, cake and other baked goods  Avoid soda, beer, alcohol, sweet tea  Exercise  You need to be exercising most days of the week for 30-45 minutes or more  Good forms of exercise include walking, hiking, stationary bike or bicycling outside, lap swimming, aerobics class, dance, Zumba  Consider getting a trainer at a gym to help with exercise  Medication  Begin Qsymia weight loss medication  Start by taking the Qsymia 3.75/23 mg dose, once daily in the morning before breakfast for the first 2 weeks  Then increase to the Qsymia 7.5/46mg  dose, once daily in the morning before breakfast  You will need to use the coupon card to call and activate the medication  If your  insurance does not cover the weight loss medicine listed above, check on the insurance coverage for the following medications:  Belviq  Contrave  Consider weighing yourself daily to keep track of your weight  Returnin 4- 6 weeks for recheck on weightr loss medication  Medication costs:  If you get to the pharmacy and medication prescribed today was either too expensive, not covered by your insurance, or required prior authorization, then please call us back to let us know.  We often have no way to know if a medication is too expensive or not covered by your insurance.  Thanks for your cooperation.

## 2015-01-07 NOTE — Progress Notes (Signed)
Subjective: Chief Complaint  Patient presents with  . BP check    [name]. is a 59 y.o. male who presents follow up on BP, weight.   Hypertension - Here for follow-up of hypertension.  Was due back months ago, but he is just now coming back per my request.   He is exercising some.   He is adherent to a low-salt diet.  Blood pressure is somewhat improved.   Exercise is limited by him taking caring of his foster children, several of which are special needs.  Has 2 special needs children, 3 total foster children at home.  Cardiac symptoms - none, denies chest pain, claudication, dyspnea, fatigue and lower extremity edema.  Cardiovascular risk factors: advanced age (older than 65 for men, 58 for women), hypertension, male gender and obesity (BMI >= 30 kg/m2). Use of agents associated with hypertension: none. History of target organ damage: none.     Obesity -not losing weight.  Eating relatively healthy, getting some walking.  Limited by schedule.  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   Wants to see if medication can help him get a jump on weight loss  PSA had increased significantly from prior on labs earlier this year.  Denies any prostate symptoms.    No family hx/o prostate cancer or heart disease  No other aggravating or relieving factors.    No other c/o.  The following portions of the patient's history were reviewed and updated as appropriate: allergies, current medications, past family history, past medical history, past social history, past surgical history and problem list.  ROS Otherwise as in subjective above   Objective: BP 150/100 mmHg  Pulse 76  Temp(Src) 97.9 F (36.6 C) (Oral)  Resp 18  Wt 385 lb 6.4 oz (174.816 kg)  BP Readings from Last 3 Encounters:  01/07/15 150/100  04/11/14 190/110  07/12/12 130/84   Wt Readings from Last 3 Encounters:  01/07/15 385 lb 6.4  oz (174.816 kg)  04/11/14 380 lb (172.367 kg)  07/12/12 364 lb (165.109 kg)   Gen: obese AA male Heart: RRR, normal S1, S2, no murmurs lungs clear Ext: no edema Pulses normal Neuro: nonfocal exam   Assessment: Encounter Diagnoses  Name Primary?  . Essential hypertension Yes  . Low HDL (under 40)   . Morbid obesity with BMI of 45.0-49.9, adult   . Abnormal PSA      Plan: HTN - increase dose of Losartan HCT today.  BMET lab today Low HDL - add statin and Aspirin to reduce heart disease risks Morbid obesity - referral to nutritionist, work on weight loss efforts as discussed.   discussed medication to help, begin Qsymia trial. discussed risks/benefits of medication. Abnormal PSA- repeat lab today F/u pending labs Handout given   [name] was seen today for bp check.  Diagnoses and all orders for this visit:  Essential hypertension -     Basic metabolic panel  Low HDL (under 40)  Morbid obesity with BMI of 45.0-49.9, adult  Abnormal PSA -     PSA, total and free  Other orders -     Phentermine-Topiramate (QSYMIA) 3.75-23 MG CP24; Take 1 capsule by mouth every morning. -     Phentermine-Topiramate (QSYMIA) 7.5-46 MG CP24; Take 1 capsule by mouth every morning. -     losartan-hydrochlorothiazide (HYZAAR) 100-12.5 MG per tablet; Take 1 tablet by mouth daily. -  aspirin EC 81 MG tablet; Take 1 tablet (81 mg total) by mouth daily. -     pravastatin (PRAVACHOL) 20 MG tablet; Take 1 tablet (20 mg total) by mouth daily.

## 2015-01-08 LAB — PSA, TOTAL AND FREE
PSA FREE: 0.28 ng/mL
PSA, Free Pct: 5 % — ABNORMAL LOW (ref >25)
PSA: 5.63 ng/mL — AB (ref <=4.00)

## 2015-01-09 NOTE — Addendum Note (Signed)
Addended by: Lilli Light on: 01/09/2015 10:07 AM   Modules accepted: Orders

## 2015-01-10 ENCOUNTER — Telehealth: Payer: Self-pay

## 2015-01-10 NOTE — Telephone Encounter (Signed)
Left message letting him know he has an appt with [facility] 02/28/15 at 2:00pm and gave the phone# and address.

## 2015-01-22 ENCOUNTER — Other Ambulatory Visit: Payer: Self-pay | Admitting: Medical

## 2015-02-28 ENCOUNTER — Telehealth: Payer: Self-pay | Admitting: Medical

## 2015-02-28 ENCOUNTER — Ambulatory Visit (INDEPENDENT_AMBULATORY_CARE_PROVIDER_SITE_OTHER): Payer: BLUE CROSS/BLUE SHIELD | Admitting: Medical

## 2015-02-28 ENCOUNTER — Telehealth: Payer: Self-pay

## 2015-02-28 ENCOUNTER — Encounter: Payer: Self-pay | Admitting: Medical

## 2015-02-28 VITALS — BP 120/90 | HR 76 | Temp 97.7°F | Wt 378.0 lb

## 2015-02-28 DIAGNOSIS — E786 Lipoprotein deficiency: Secondary | ICD-10-CM | POA: Diagnosis not present

## 2015-02-28 DIAGNOSIS — Z6841 Body Mass Index (BMI) 40.0 and over, adult: Secondary | ICD-10-CM | POA: Diagnosis not present

## 2015-02-28 DIAGNOSIS — I10 Essential (primary) hypertension: Secondary | ICD-10-CM | POA: Diagnosis not present

## 2015-02-28 MED ORDER — LOSARTAN POTASSIUM-HCTZ 100-12.5 MG PO TABS: 1.0000 | ORAL_TABLET | Freq: Every day | ORAL | Status: DC

## 2015-02-28 MED ORDER — PHENTERMINE-TOPIRAMATE ER 7.5-46 MG PO CP24: 1.0000 | ORAL_CAPSULE | ORAL | Status: DC

## 2015-02-28 MED ORDER — PHENTERMINE-TOPIRAMATE ER 3.75-23 MG PO CP24: 1.0000 | ORAL_CAPSULE | ORAL | Status: DC

## 2015-02-28 MED ORDER — PRAVASTATIN SODIUM 20 MG PO TABS: 20.0000 mg | ORAL_TABLET | Freq: Every day | ORAL | Status: DC

## 2015-02-28 NOTE — Telephone Encounter (Signed)
Patient came in today up front to note pharmacy doesn't have the capsules in stock, there is a shortage of Dexedrine capsule.  I changed it to Dexedrine tablet at their request, 71mo supply.  They can f/u with Dr. Susann Givens in a month if capsules still not available.  Patient still has the other 2 scripts of capsules for the next 2 months.  We shredded the current month scrip for capsules.

## 2015-02-28 NOTE — Telephone Encounter (Signed)
pls call in both doses of Qsymia.  See Rx in chart, there is the lower dose for 14 days and then the regular dose.  If not sure, let me know.

## 2015-02-28 NOTE — Telephone Encounter (Signed)
You gave him a discount card for Qsymia and he needs you to call in a prescription for it so he can use the card.

## 2015-02-28 NOTE — Telephone Encounter (Signed)
Phoned both in

## 2015-02-28 NOTE — Progress Notes (Signed)
Subjective: Chief Complaint  Patient presents with  . Follow-up    qsymia, said he can not use the card until you call and he has not started medication. no other concerns   Here for recheck on BP, weight, lipids.   Mainly wants to discuss the weight loss medication.  Went to pharmacy after last visit, said we had to call a phone number to talk with someone?   copay reportedly $75/mo but wants to try this.  He has lost 8lb since last visit, BPs looking better, compliant the higher dose of Losartan HCT 100/12.5mg  daily and compliant with aspirin and Pravachol at bedtime started last visit.   No other c/o.   Exercising, eating healthy.   No other aggravating or relieving factors. No other complaint.  Past Medical History  Diagnosis Date  . Obesity     has been up to 400lb  . Wears glasses   . Pneumonia   . Family history of colon cancer     father -questionable  . Hypertension     previous HTN with 400lb weight   ROS as in subjective   Objective: BP 120/90 mmHg  Pulse 76  Temp(Src) 97.7 F (36.5 C) (Oral)  Wt 378 lb (171.46 kg)  BP Readings from Last 3 Encounters:  02/28/15 120/90  01/07/15 150/100  04/11/14 190/110   Wt Readings from Last 3 Encounters:  02/28/15 378 lb (171.46 kg)  01/07/15 385 lb 6.4 oz (174.816 kg)  04/11/14 380 lb (172.367 kg)   Gen: wd, wn, nad Heart: RRR, normal s1, s2, no murmurs Lungs clear Ext: no edema Pulses normal    Assessment: Encounter Diagnoses  Name Primary?  . Essential hypertension Yes  . Low HDL (under 40)   . Morbid obesity with BMI of 45.0-49.9, adult (HCC)     Plan: HTN - improved but not at goal.   C/t efforts at weight loss.  Will hold off on medication adjustment for now at his request. Low HDL - he is compliant with Pravachol and ASA daily.  He does not want labs today, in a hurry.   Will check labs next visit, fasting. Morbid obesity - c/t plan to start Qysmia, c/t weight loss efforts with exercise and healthy diet.

## 2015-03-08 ENCOUNTER — Telehealth: Payer: Self-pay | Admitting: Medical

## 2015-03-08 NOTE — Telephone Encounter (Signed)
P.A. QSYMIA 

## 2015-03-10 NOTE — Telephone Encounter (Signed)
P.A. Approved Qysmia 3.75-23 til 09/06/15, left message for pt, unable to fax

## 2015-03-25 NOTE — Telephone Encounter (Signed)
Called pharmacy & Qsymia higher strength went thru also. Called and left message for pt

## 2015-04-01 ENCOUNTER — Telehealth: Payer: Self-pay

## 2015-04-01 NOTE — Telephone Encounter (Signed)
Pt called saying he finished the low-dose of Qsymia and wants to know if he should refill the low dose or start the high-dose.

## 2015-04-01 NOTE — Telephone Encounter (Signed)
He has only done the starter pack and will be starting the regular dose when the pharmacy gets it ready, he should be starting it today. I instructed him to call us back when he finishes it to make a f/u appt.

## 2015-04-01 NOTE — Telephone Encounter (Signed)
If he finished the starter pack and 30 day regular dose, then he needs to come back for f/u appt.  If he only did the 2 wk starter pack, then he needs to do the 30 day regular dose

## 2015-05-09 ENCOUNTER — Encounter: Payer: Self-pay | Admitting: Medical

## 2015-05-09 ENCOUNTER — Ambulatory Visit (INDEPENDENT_AMBULATORY_CARE_PROVIDER_SITE_OTHER): Payer: BLUE CROSS/BLUE SHIELD | Admitting: Medical

## 2015-05-09 VITALS — BP 120/90 | HR 93 | Wt 359.0 lb

## 2015-05-09 DIAGNOSIS — E786 Lipoprotein deficiency: Secondary | ICD-10-CM

## 2015-05-09 DIAGNOSIS — Z6841 Body Mass Index (BMI) 40.0 and over, adult: Secondary | ICD-10-CM

## 2015-05-09 DIAGNOSIS — I10 Essential (primary) hypertension: Secondary | ICD-10-CM

## 2015-05-09 NOTE — Progress Notes (Signed)
Subjective: Chief Complaint  Patient presents with  . get of qsymia    said that it is to expensive. states he doesnt want to try another brand. said he thinks he can take over on his own   Here for f/u on weight loss.  Seeing improvements, has dropped some pant sizes, doing well.   Wants to come off Qsymia and do it on his own.   Ended up taking 2 months of Qsymia, and just got refill on 3rd month, but costs $60 and not wanting to continue this.  Exercise - does some weights, walks all day at work.  Diet - eating subs, salads, on weekends maybe treats himself, but has made significant changes.   Compliant with BP medication.  No other aggravating or relieving factors. No other complaint.  Past Medical History  Diagnosis Date  . Obesity     has been up to 400lb  . Wears glasses   . Pneumonia   . Family history of colon cancer     father -questionable  . Hypertension     previous HTN with 400lb weight   ROS as in subjective   Objective: BP 120/90 mmHg  Pulse 93  Wt 359 lb (162.841 kg)  Wt Readings from Last 3 Encounters:  05/09/15 359 lb (162.841 kg)  02/28/15 378 lb (171.46 kg)  01/07/15 385 lb 6.4 oz (174.816 kg)   BP Readings from Last 3 Encounters:  05/09/15 120/90  02/28/15 120/90  01/07/15 150/100   Gen: wd, wn, nad Otherwise not examined   Assessment: Encounter Diagnoses  Name Primary?  . Essential hypertension Yes  . Morbid obesity with BMI of 45.0-49.9, adult (HCC)   . Low HDL (under 40)     Plan: Advised he take Qsymia 1 tablet every other day for a week, then ever 2 days until he runs out of it.   discussed diet, exercise, goal setting ,water intake, and he will c/t efforts.   Congratulated him on his improvement.   F/u 31mo fasting.    C/t his routine medications

## 2015-08-05 ENCOUNTER — Other Ambulatory Visit: Payer: Self-pay | Admitting: Family Medicine

## 2015-08-05 MED ORDER — LOSARTAN POTASSIUM-HCTZ 100-12.5 MG PO TABS: 1.0000 | ORAL_TABLET | Freq: Every day | ORAL | Status: DC

## 2015-11-20 ENCOUNTER — Other Ambulatory Visit: Payer: Self-pay | Admitting: Medical

## 2015-12-14 ENCOUNTER — Other Ambulatory Visit: Payer: Self-pay | Admitting: Medical

## 2016-01-07 ENCOUNTER — Other Ambulatory Visit: Payer: Self-pay | Admitting: Medical

## 2016-01-07 NOTE — Telephone Encounter (Signed)
Attempted call to pt- he needs appt before any refills.

## 2016-02-17 ENCOUNTER — Other Ambulatory Visit: Payer: Self-pay | Admitting: Medical

## 2016-05-02 ENCOUNTER — Other Ambulatory Visit: Payer: Self-pay | Admitting: Medical

## 2016-05-13 ENCOUNTER — Telehealth: Payer: Self-pay | Admitting: Medical

## 2016-05-13 ENCOUNTER — Encounter: Payer: BLUE CROSS/BLUE SHIELD | Admitting: Medical

## 2016-05-13 ENCOUNTER — Other Ambulatory Visit: Payer: Self-pay

## 2016-05-13 MED ORDER — PRAVASTATIN SODIUM 20 MG PO TABS: 20.0000 mg | ORAL_TABLET | Freq: Every day | ORAL | 0 refills | Status: DC

## 2016-05-13 MED ORDER — LOSARTAN POTASSIUM-HCTZ 100-12.5 MG PO TABS: 1.0000 | ORAL_TABLET | Freq: Every day | ORAL | 0 refills | Status: DC

## 2016-05-13 MED ORDER — PHENTERMINE-TOPIRAMATE ER 3.75-23 MG PO CP24: 1.0000 | ORAL_CAPSULE | ORAL | 0 refills | Status: DC

## 2016-05-13 NOTE — Telephone Encounter (Signed)
Sent refill to pharmacy. 

## 2016-05-13 NOTE — Telephone Encounter (Signed)
Pt came in for appt but had insurance problems and had to rescdeule appt please refill rx for 30 days he needs refills on losartan,qsymia,and pravastatin pt uses [id number] [place], [place] - [address]

## 2016-05-13 NOTE — Telephone Encounter (Signed)
pls send those requested for 30 days

## 2016-05-25 ENCOUNTER — Encounter: Payer: BLUE CROSS/BLUE SHIELD | Admitting: Medical

## 2016-06-25 ENCOUNTER — Ambulatory Visit (INDEPENDENT_AMBULATORY_CARE_PROVIDER_SITE_OTHER): Payer: BLUE CROSS/BLUE SHIELD | Admitting: Medical

## 2016-06-25 VITALS — BP 154/112 | Ht 73.0 in | Wt 387.6 lb

## 2016-06-25 DIAGNOSIS — E786 Lipoprotein deficiency: Secondary | ICD-10-CM

## 2016-06-25 DIAGNOSIS — I10 Essential (primary) hypertension: Secondary | ICD-10-CM

## 2016-06-25 DIAGNOSIS — Z6841 Body Mass Index (BMI) 40.0 and over, adult: Secondary | ICD-10-CM

## 2016-06-25 DIAGNOSIS — R972 Elevated prostate specific antigen [PSA]: Secondary | ICD-10-CM | POA: Diagnosis not present

## 2016-06-25 DIAGNOSIS — Z7189 Other specified counseling: Secondary | ICD-10-CM | POA: Diagnosis not present

## 2016-06-25 DIAGNOSIS — Z0000 Encounter for general adult medical examination without abnormal findings: Secondary | ICD-10-CM

## 2016-06-25 DIAGNOSIS — Z125 Encounter for screening for malignant neoplasm of prostate: Secondary | ICD-10-CM | POA: Diagnosis not present

## 2016-06-25 DIAGNOSIS — Z1211 Encounter for screening for malignant neoplasm of colon: Secondary | ICD-10-CM | POA: Diagnosis not present

## 2016-06-25 DIAGNOSIS — Z7185 Encounter for immunization safety counseling: Secondary | ICD-10-CM

## 2016-06-25 LAB — POCT URINALYSIS DIPSTICK
BILIRUBIN UA: NEGATIVE
GLUCOSE UA: NEGATIVE
KETONES UA: NEGATIVE
Leukocytes, UA: NEGATIVE
Nitrite, UA: NEGATIVE
RBC UA: NEGATIVE
Urobilinogen, UA: NEGATIVE
pH, UA: 6

## 2016-06-25 LAB — CBC WITH DIFFERENTIAL/PLATELET
BASOS PCT: 0 %
Basophils Absolute: 0 cells/uL (ref 0–200)
EOS ABS: 108 {cells}/uL (ref 15–500)
Eosinophils Relative: 2 %
HCT: 41.2 % (ref 38.5–50.0)
Hemoglobin: 14 g/dL (ref 13.2–17.1)
Lymphocytes Relative: 42 %
Lymphs Abs: 2268 cells/uL (ref 850–3900)
MCH: 30.6 pg (ref 27.0–33.0)
MCHC: 34 g/dL (ref 32.0–36.0)
MCV: 90 fL (ref 80.0–100.0)
MONOS PCT: 8 %
MPV: 9.8 fL (ref 7.5–12.5)
Monocytes Absolute: 432 cells/uL (ref 200–950)
NEUTROS ABS: 2592 {cells}/uL (ref 1500–7800)
Neutrophils Relative %: 48 %
PLATELETS: 282 10*3/uL (ref 140–400)
RBC: 4.58 MIL/uL (ref 4.20–5.80)
RDW: 13.5 % (ref 11.0–15.0)
WBC: 5.4 10*3/uL (ref 4.0–10.5)

## 2016-06-25 LAB — COMPREHENSIVE METABOLIC PANEL
ALBUMIN: 4.1 g/dL (ref 3.6–5.1)
ALT: 17 U/L (ref 9–46)
AST: 25 U/L (ref 10–35)
Alkaline Phosphatase: 58 U/L (ref 40–115)
BILIRUBIN TOTAL: 0.8 mg/dL (ref 0.2–1.2)
BUN: 15 mg/dL (ref 7–25)
CALCIUM: 9.4 mg/dL (ref 8.6–10.3)
CO2: 18 mmol/L — AB (ref 20–31)
Chloride: 106 mmol/L (ref 98–110)
Creat: 1.16 mg/dL (ref 0.70–1.33)
Glucose, Bld: 104 mg/dL — ABNORMAL HIGH (ref 65–99)
Potassium: 4.2 mmol/L (ref 3.5–5.3)
Sodium: 137 mmol/L (ref 135–146)
Total Protein: 7.8 g/dL (ref 6.1–8.1)

## 2016-06-25 LAB — HEMOGLOBIN A1C
HEMOGLOBIN A1C: 5.5 % (ref <5.7)
Mean Plasma Glucose: 111 mg/dL

## 2016-06-25 LAB — LIPID PANEL
CHOL/HDL RATIO: 2.6 ratio (ref <5.0)
CHOLESTEROL: 127 mg/dL (ref <200)
HDL: 49 mg/dL (ref >40)
LDL Cholesterol: 62 mg/dL (ref <100)
Triglycerides: 79 mg/dL (ref <150)
VLDL: 16 mg/dL (ref <30)

## 2016-06-25 LAB — TSH: TSH: 2.88 mIU/L (ref 0.40–4.50)

## 2016-06-25 NOTE — Patient Instructions (Addendum)
Recommendations:  Take your medications EVERY day  Eat a healthy low fat diet  Exercise regularly  Please check insurance coverage for Cologuard vs Colonoscopy and let me know what you want to do  Please check insurance coverage for shingles vaccine Shingrix as I want you to have this  I recommend a yearly flu shot  If your PSA is elevated, then I need you to see the Urologist to rule out prostate cancer  consider weight loss medications vs surgery vs nutrition consult  We would be happy to refer you to a dietician or for weight loss surgery consult  Medications for weight loss can be expensive, but check your insurance for the following:  Qsymia  Saxenda  Contrave  Belviq  See your eye doctor yearly for routine vision care.  See your dentist yearly for routine dental care including hygiene visits twice yearly.   Bariatric Surgery Information Bariatric surgery, also called weight loss surgery, is a procedure that helps you lose weight. You may consider or your health care provider may suggest bariatric surgery if:  You are severely obese and have been unable to lose weight through diet and exercise.  You have health problems related to obesity, such as:  Type 2 diabetes.  Heart disease.  Lung disease. How does bariatric surgery help me lose weight? Bariatric surgery helps you lose weight by decreasing how much food your body absorbs. This is done by closing off part of your stomach to make it smaller. This restricts the amount of food your stomach can hold. Bariatric surgery can also change your body's regular digestive process, so that food bypasses the parts of your body that absorb calories and nutrients. If you decide to have bariatric surgery, it is important to continue to eat a healthy diet and exercise regularly after the surgery. What are the different kinds of bariatric surgery? There are two kinds of bariatric surgeries:  Restrictive surgeries make your  stomach smaller. They do not change your digestive process. The smaller the size of your new stomach, the less food you can eat. There are different types of restrictive surgeries.  Malabsorptive surgeries both make your stomach smaller and alter your digestive process so that your body processes less calories and nutrients. These are the most common kind of bariatric surgery. There are different types of malabsorptive surgeries. What are the different types of restrictive surgery? Adjustable Gastric Banding  In this procedure, an inflatable band is placed around your stomach near the upper end. This makes the passageway for food into the rest of your stomach much smaller. The band can be adjusted, making it tighter or looser, by filling it with salt solution. Your surgeon can adjust the band based on how are you feeling and how much weight you are losing. The band can be removed in the future. Vertical Banded Gastroplasty  In this procedure, staples are used to separate your stomach into two parts, a small upper pouch and a bigger lower pouch. This decreases how much food you can eat. Sleeve Gastrectomy  In this procedure, your stomach is made smaller. This is done by surgically removing a large part of your stomach. When your stomach is smaller, you feel full more quickly and reduce how much you eat. What are the different types of malabsorptive surgery? Roux-en-Y Gastric Bypass (RGB)  This is the most common weight loss surgery. In this procedure, a small stomach pouch is created in the upper part of your stomach. Next, this small stomach  pouch is attached directly to the middle part of your small intestine. The farther down your small intestine the new connection is made, the fewer calories and nutrients you will absorb. Biliopancreatic Diversion with Duodenal Switch (BPD/DS)  This is a multi-step procedure. In this procedure, a large part of your stomach is removed, making your stomach smaller.  Next, this smaller stomach is attached to the lower part of your small intestine. Like the RGB surgery, you absorb fewer calories and nutrients the farther down your small intestine the attachment is made. What are the risks of bariatric surgery? As with any surgical procedure, each type of bariatric surgery has its own risks. These risks also depend on your age, your overall health, and any other medical conditions you may have. When deciding on bariatric surgery, it is very important to:  Talk to your health care provider and choose the surgery that is best for you.  Ask your health care provider about specific risks for the surgery you choose. Where to find more information:  American Society for Metabolic & Bariatric Surgery: www.asmbs.org  Weight-control Information Network (WIN): win.StageSync.si This information is not intended to replace advice given to you by your health care provider. Make sure you discuss any questions you have with your health care provider. Document Released: 04/19/2005 Document Revised: 09/25/2015 Document Reviewed: 10/18/2012 Elsevier Interactive Patient Education  2017 [employer].
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
## 2016-06-26 LAB — MICROALBUMIN / CREATININE URINE RATIO
Creatinine, Urine: 296 mg/dL (ref 20–370)
MICROALB UR: 0.9 mg/dL
MICROALB/CREAT RATIO: 3 ug/mg{creat} (ref <30)

## 2016-06-26 LAB — PSA: PSA: 6.6 ng/mL — ABNORMAL HIGH (ref <=4.0)

## 2016-06-27 ENCOUNTER — Other Ambulatory Visit: Payer: Self-pay | Admitting: Medical

## 2016-06-28 ENCOUNTER — Other Ambulatory Visit: Payer: Self-pay

## 2016-06-28 MED ORDER — LOSARTAN POTASSIUM-HCTZ 100-12.5 MG PO TABS
1.0000 | ORAL_TABLET | Freq: Every day | ORAL | 1 refills | Status: DC
Start: 2016-06-28 — End: 2017-03-10

## 2016-06-28 MED ORDER — PRAVASTATIN SODIUM 20 MG PO TABS: 20.0000 mg | ORAL_TABLET | Freq: Every day | ORAL | 1 refills | Status: DC

## 2016-07-06 ENCOUNTER — Other Ambulatory Visit: Payer: Self-pay | Admitting: Medical

## 2016-07-06 ENCOUNTER — Encounter: Payer: Self-pay | Admitting: Internal Medicine

## 2016-07-06 DIAGNOSIS — R972 Elevated prostate specific antigen [PSA]: Secondary | ICD-10-CM

## 2016-07-28 ENCOUNTER — Telehealth: Payer: Self-pay

## 2016-08-05 NOTE — Telephone Encounter (Signed)
error 

## 2017-01-11 ENCOUNTER — Other Ambulatory Visit: Payer: Self-pay | Admitting: Medical

## 2017-03-04 ENCOUNTER — Emergency Department (HOSPITAL_COMMUNITY)
Admission: EM | Admit: 2017-03-04 | Discharge: 2017-03-04 | Disposition: A | Discharge status: Home / Self Care | Payer: No Typology Code available for payment source | Attending: Emergency Medicine | Admitting: Emergency Medicine

## 2017-03-04 ENCOUNTER — Emergency Department (HOSPITAL_COMMUNITY): Payer: No Typology Code available for payment source

## 2017-03-04 ENCOUNTER — Encounter (HOSPITAL_COMMUNITY): Payer: Self-pay

## 2017-03-04 DIAGNOSIS — Z7982 Long term (current) use of aspirin: Secondary | ICD-10-CM | POA: Insufficient documentation

## 2017-03-04 DIAGNOSIS — W2210XA Striking against or struck by unspecified automobile airbag, initial encounter: Secondary | ICD-10-CM | POA: Insufficient documentation

## 2017-03-04 DIAGNOSIS — M79601 Pain in right arm: Secondary | ICD-10-CM | POA: Diagnosis not present

## 2017-03-04 DIAGNOSIS — M7918 Myalgia, other site: Secondary | ICD-10-CM | POA: Diagnosis not present

## 2017-03-04 DIAGNOSIS — M549 Dorsalgia, unspecified: Secondary | ICD-10-CM | POA: Insufficient documentation

## 2017-03-04 DIAGNOSIS — Y9389 Activity, other specified: Secondary | ICD-10-CM | POA: Insufficient documentation

## 2017-03-04 DIAGNOSIS — M542 Cervicalgia: Secondary | ICD-10-CM | POA: Insufficient documentation

## 2017-03-04 DIAGNOSIS — M79604 Pain in right leg: Secondary | ICD-10-CM | POA: Diagnosis not present

## 2017-03-04 DIAGNOSIS — M79641 Pain in right hand: Secondary | ICD-10-CM | POA: Insufficient documentation

## 2017-03-04 DIAGNOSIS — Y929 Unspecified place or not applicable: Secondary | ICD-10-CM | POA: Diagnosis not present

## 2017-03-04 DIAGNOSIS — Z79899 Other long term (current) drug therapy: Secondary | ICD-10-CM | POA: Diagnosis not present

## 2017-03-04 DIAGNOSIS — Y998 Other external cause status: Secondary | ICD-10-CM | POA: Insufficient documentation

## 2017-03-04 DIAGNOSIS — R51 Headache: Secondary | ICD-10-CM | POA: Diagnosis present

## 2017-03-04 MED ORDER — CYCLOBENZAPRINE HCL 5 MG PO TABS: 5.0000 mg | ORAL_TABLET | Freq: Two times a day (BID) | ORAL | 0 refills | Status: DC | PRN

## 2017-03-04 MED ORDER — ACETAMINOPHEN 500 MG PO TABS: 1000.0000 mg | ORAL_TABLET | Freq: Three times a day (TID) | ORAL | 0 refills | Status: DC

## 2017-03-04 NOTE — Discharge Instructions (Signed)
Use Tylenol 3 times a day with meals.  Use Flexeril twice a day as needed for muscle stiffness or soreness.  Have caution, as this may make you tired or drowsy.  Do not drive or operate heavy machinery until you know this medicine affects you. Wear the wrist brace as needed for wrist or hand pain.  Do not sleep in this. You may use ice or heat if this helps your pain. You will likely have continued muscle stiffness and soreness over the next several days.  Follow-up with your primary care doctor in 1 week if your pain is not improving. Return to emergency room if you develop vision changes, vomiting, loss of bowel or bladder control, numbness, or any new or worsening symptoms.

## 2017-03-04 NOTE — Progress Notes (Signed)
Orthopedic Tech Progress Note Patient Details:  [name] [name] Jul 01, 1955 [id number]  Ortho Devices Type of Ortho Device: Velcro wrist splint Ortho Device/Splint Location: applied Velcro wrist splint to pt right wrist.  pt tolerated application very well.  Right wrist.  Ortho Device/Splint Interventions: Application, Adjustment   Alvina Chou 03/04/2017, 2:41 PM
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
## 2017-03-10 ENCOUNTER — Encounter: Payer: Self-pay | Admitting: Medical

## 2017-03-10 ENCOUNTER — Other Ambulatory Visit: Payer: Self-pay

## 2017-03-10 ENCOUNTER — Ambulatory Visit: Payer: BLUE CROSS/BLUE SHIELD | Admitting: Medical

## 2017-03-10 VITALS — BP 142/84 | HR 75 | Wt 396.4 lb

## 2017-03-10 DIAGNOSIS — M79601 Pain in right arm: Secondary | ICD-10-CM | POA: Insufficient documentation

## 2017-03-10 DIAGNOSIS — M549 Dorsalgia, unspecified: Secondary | ICD-10-CM | POA: Diagnosis not present

## 2017-03-10 DIAGNOSIS — M79641 Pain in right hand: Secondary | ICD-10-CM | POA: Diagnosis not present

## 2017-03-10 DIAGNOSIS — M25561 Pain in right knee: Secondary | ICD-10-CM | POA: Insufficient documentation

## 2017-03-10 DIAGNOSIS — S8001XA Contusion of right knee, initial encounter: Secondary | ICD-10-CM | POA: Diagnosis not present

## 2017-03-10 DIAGNOSIS — M542 Cervicalgia: Secondary | ICD-10-CM | POA: Diagnosis not present

## 2017-03-10 DIAGNOSIS — S60221A Contusion of right hand, initial encounter: Secondary | ICD-10-CM | POA: Diagnosis not present

## 2017-03-10 DIAGNOSIS — M25531 Pain in right wrist: Secondary | ICD-10-CM | POA: Insufficient documentation

## 2017-03-10 MED ORDER — NAPROXEN 500 MG PO TABS: 500.0000 mg | ORAL_TABLET | Freq: Two times a day (BID) | ORAL | 0 refills | Status: DC

## 2017-03-10 MED ORDER — LOSARTAN POTASSIUM-HCTZ 100-12.5 MG PO TABS: 1.0000 | ORAL_TABLET | Freq: Every day | ORAL | 0 refills | Status: DC

## 2017-03-10 MED ORDER — HYDROCODONE-ACETAMINOPHEN 5-325 MG PO TABS: 1.0000 | ORAL_TABLET | Freq: Four times a day (QID) | ORAL | 0 refills | Status: DC | PRN

## 2017-03-10 MED ORDER — PRAVASTATIN SODIUM 20 MG PO TABS: 20.0000 mg | ORAL_TABLET | Freq: Every day | ORAL | 0 refills | Status: DC

## 2017-03-10 NOTE — Patient Instructions (Addendum)
Recommendations:  Use ice pack or bag of frozen peas, 20 minutes 3 times daily to right thumb and hand as well as right knee. Use ice the next 3-4 days  Try to stretch your neck, back, arm and legs daily to help with soreness and increase range of motion  We will refer you to physical therapy to help with pains in neck, back, right arm and leg  Continue Flexeril 1-2 times daily but this may make you sleepy  Begin Naprosyn twice daily with food.  This is for pain and inflammation.  Use for 1-2 weeks.  Being Norco/hydrocodone pain medication 1 tablet every 6 hours as needed for worse or breakthrough pain.  Caution as this can cause sedation  The goal for the next 3-5 days is improvement in pain and swelling  The goal for the next 2 weeks is reduction in soreness, improvement in range of motion and focusing on use of right hand.  Use the splint for the next 1-2 weeks on the hand/wrist  Plan follow up here in 1 week.
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
## 2017-03-30 ENCOUNTER — Ambulatory Visit: Payer: BLUE CROSS/BLUE SHIELD | Admitting: Medical

## 2017-03-30 ENCOUNTER — Ambulatory Visit
Admission: RE | Admit: 2017-03-30 | Discharge: 2017-03-30 | Disposition: A | Discharge status: Home / Self Care | Payer: BLUE CROSS/BLUE SHIELD | Source: Ambulatory Visit | Attending: Medical | Admitting: Medical

## 2017-03-30 ENCOUNTER — Encounter: Payer: Self-pay | Admitting: Medical

## 2017-03-30 VITALS — BP 158/84 | HR 77 | Wt 392.0 lb

## 2017-03-30 DIAGNOSIS — M79601 Pain in right arm: Secondary | ICD-10-CM

## 2017-03-30 DIAGNOSIS — M542 Cervicalgia: Secondary | ICD-10-CM

## 2017-03-30 DIAGNOSIS — S60221A Contusion of right hand, initial encounter: Secondary | ICD-10-CM

## 2017-03-30 DIAGNOSIS — Z23 Encounter for immunization: Secondary | ICD-10-CM | POA: Diagnosis not present

## 2017-03-30 DIAGNOSIS — M25531 Pain in right wrist: Secondary | ICD-10-CM

## 2017-03-30 DIAGNOSIS — M79641 Pain in right hand: Secondary | ICD-10-CM

## 2017-03-30 DIAGNOSIS — S8001XA Contusion of right knee, initial encounter: Secondary | ICD-10-CM | POA: Diagnosis not present

## 2017-03-30 DIAGNOSIS — M549 Dorsalgia, unspecified: Secondary | ICD-10-CM

## 2017-03-30 MED ORDER — HYDROCODONE-ACETAMINOPHEN 5-325 MG PO TABS: 1.0000 | ORAL_TABLET | Freq: Four times a day (QID) | ORAL | 0 refills | Status: DC | PRN

## 2017-03-30 MED ORDER — CYCLOBENZAPRINE HCL 5 MG PO TABS: 5.0000 mg | ORAL_TABLET | Freq: Two times a day (BID) | ORAL | 0 refills | Status: DC | PRN

## 2017-03-30 MED ORDER — NAPROXEN 500 MG PO TABS: 500.0000 mg | ORAL_TABLET | Freq: Two times a day (BID) | ORAL | 0 refills | Status: DC
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
## 2017-04-04 ENCOUNTER — Telehealth: Payer: Self-pay | Admitting: Family Medicine

## 2017-04-04 ENCOUNTER — Other Ambulatory Visit: Payer: Self-pay

## 2017-04-04 MED ORDER — LOSARTAN POTASSIUM-HCTZ 100-12.5 MG PO TABS: 1.0000 | ORAL_TABLET | Freq: Every day | ORAL | 0 refills | Status: DC

## 2017-04-04 NOTE — Telephone Encounter (Signed)
Losartan HCTZ req from [facility]

## 2017-04-04 NOTE — Telephone Encounter (Signed)
Sent refill on meds.

## 2017-04-12 ENCOUNTER — Ambulatory Visit: Payer: BLUE CROSS/BLUE SHIELD | Admitting: Physical Therapy

## 2017-04-15 ENCOUNTER — Telehealth: Payer: Self-pay | Admitting: Medical

## 2017-04-15 ENCOUNTER — Other Ambulatory Visit: Payer: Self-pay | Admitting: Medical

## 2017-04-15 MED ORDER — NAPROXEN 500 MG PO TABS: 500.0000 mg | ORAL_TABLET | Freq: Two times a day (BID) | ORAL | 0 refills | Status: DC

## 2017-04-15 NOTE — Telephone Encounter (Signed)
Rcvd refill request for Naproxen 500 mg #90

## 2017-04-19 ENCOUNTER — Ambulatory Visit: Payer: BLUE CROSS/BLUE SHIELD | Attending: Medical | Admitting: Physical Therapy

## 2017-04-19 ENCOUNTER — Encounter: Payer: Self-pay | Admitting: Physical Therapy

## 2017-04-19 DIAGNOSIS — M79601 Pain in right arm: Secondary | ICD-10-CM | POA: Diagnosis present

## 2017-04-19 DIAGNOSIS — M25541 Pain in joints of right hand: Secondary | ICD-10-CM | POA: Diagnosis present

## 2017-04-19 DIAGNOSIS — M2560 Stiffness of unspecified joint, not elsewhere classified: Secondary | ICD-10-CM | POA: Diagnosis present

## 2017-04-19 DIAGNOSIS — M542 Cervicalgia: Secondary | ICD-10-CM | POA: Diagnosis present

## 2017-04-19 DIAGNOSIS — IMO0002 Reserved for concepts with insufficient information to code with codable children: Secondary | ICD-10-CM

## 2017-04-19 NOTE — Patient Instructions (Addendum)
Cane Overhead - Supine  Hold cane at thighs with both hands, extend arms straight over head. Hold _10__ seconds. Repeat __10_ times. Do __2_ times per day.    Chest press x 10 x 2 sets per day
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
## 2017-04-27 ENCOUNTER — Ambulatory Visit: Payer: BLUE CROSS/BLUE SHIELD | Admitting: Physical Therapy

## 2017-04-27 ENCOUNTER — Encounter: Payer: Self-pay | Admitting: Physical Therapy

## 2017-04-27 DIAGNOSIS — M2560 Stiffness of unspecified joint, not elsewhere classified: Secondary | ICD-10-CM

## 2017-04-27 DIAGNOSIS — M25541 Pain in joints of right hand: Secondary | ICD-10-CM

## 2017-04-27 DIAGNOSIS — M79601 Pain in right arm: Secondary | ICD-10-CM

## 2017-04-27 DIAGNOSIS — M542 Cervicalgia: Secondary | ICD-10-CM

## 2017-04-27 DIAGNOSIS — IMO0002 Reserved for concepts with insufficient information to code with codable children: Secondary | ICD-10-CM
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
## 2017-04-27 NOTE — Patient Instructions (Signed)
Flexion (Eccentric) - Active-Assist (Cane)   Use unaffected arm to push affected arm forward. Avoid hiking shoulder. Keep palm relaxed. Slowly lower affected arm for 3-5 seconds, increasing use of affected arm. __10_ reps per set, __2_ sets per day, __7_ days per week.  Copyright  VHI. All rights reserved  Cane Exercise: Abduction   Hold cane with right hand over end, palm-up, with other hand palm-down. Move arm out from side and up by pushing with other arm. Hold __5__ seconds. Repeat _10___ times. Do __2__ sessions per day.  http://gt2.exer.us/81   Copyright  VHI. All rights reserved.      Cane Exercise: Extension / Internal Rotation   Stand holding cane behind back with both hands palm-up. Slide cane up spine toward head. Hold __5__ seconds. Repeat ___10_ times. Do 2____ sessions per day.  http://gt2.exer.us/85   Copyright  VHI. All rights reserved.  Gilmer Mor Exercise: Extension   Stand holding cane behind back with both hands palm-up. Lift the cane away from body. Hold __5__ seconds. Repeat __10__ times. Do __2__ sessions per day.  Strengthening: Resisted Internal Rotation   Hold tubing in left hand, elbow at side and forearm out. Rotate forearm in across body. Repeat __10__ times per set. Do __2__ sets per session. Do _2___ sessions per day.  http://orth.exer.us/830   Copyright  VHI. All rights reserved.  Strengthening: Resisted External Rotation   Hold tubing in right hand, elbow at side and forearm across body. Rotate forearm out. Repeat ___10_ times per set. Do _2___ sets per session. Do ___2_ sessions per day.   Resistive Band Rowing   With resistive band anchored in door, grasp both ends. Keeping elbows bent, pull back, squeezing shoulder blades together. Hold _5___ seconds. Repeat __20__ times. Do _2___ sessions per day.

## 2017-04-28 ENCOUNTER — Encounter: Payer: Self-pay | Admitting: Medical

## 2017-04-28 ENCOUNTER — Telehealth: Payer: Self-pay | Admitting: Medical

## 2017-04-28 ENCOUNTER — Other Ambulatory Visit: Payer: Self-pay

## 2017-04-28 ENCOUNTER — Ambulatory Visit: Payer: BLUE CROSS/BLUE SHIELD | Admitting: Medical

## 2017-04-28 VITALS — BP 138/84 | HR 71 | Wt 395.0 lb

## 2017-04-28 DIAGNOSIS — M542 Cervicalgia: Secondary | ICD-10-CM | POA: Diagnosis not present

## 2017-04-28 DIAGNOSIS — S8001XA Contusion of right knee, initial encounter: Secondary | ICD-10-CM | POA: Diagnosis not present

## 2017-04-28 DIAGNOSIS — S60221A Contusion of right hand, initial encounter: Secondary | ICD-10-CM

## 2017-04-28 DIAGNOSIS — M79601 Pain in right arm: Secondary | ICD-10-CM

## 2017-04-28 DIAGNOSIS — M25531 Pain in right wrist: Secondary | ICD-10-CM

## 2017-04-28 DIAGNOSIS — M79641 Pain in right hand: Secondary | ICD-10-CM

## 2017-04-28 DIAGNOSIS — M25561 Pain in right knee: Secondary | ICD-10-CM | POA: Diagnosis not present

## 2017-04-28 MED ORDER — LOSARTAN POTASSIUM-HCTZ 100-12.5 MG PO TABS: 1.0000 | ORAL_TABLET | Freq: Every day | ORAL | 0 refills | Status: DC

## 2017-04-28 NOTE — Telephone Encounter (Addendum)
Rcvd refill request for Pravastatin 20 mg #90 & Losartan 100-12.5 mg #90
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
## 2017-04-28 NOTE — Telephone Encounter (Signed)
done

## 2017-05-13 ENCOUNTER — Encounter: Payer: Self-pay | Admitting: Physical Therapy

## 2017-05-13 ENCOUNTER — Ambulatory Visit: Payer: PRIVATE HEALTH INSURANCE | Attending: Medical | Admitting: Physical Therapy

## 2017-05-13 DIAGNOSIS — M79601 Pain in right arm: Secondary | ICD-10-CM

## 2017-05-13 DIAGNOSIS — IMO0002 Reserved for concepts with insufficient information to code with codable children: Secondary | ICD-10-CM

## 2017-05-13 DIAGNOSIS — M542 Cervicalgia: Secondary | ICD-10-CM | POA: Insufficient documentation

## 2017-05-13 DIAGNOSIS — M2560 Stiffness of unspecified joint, not elsewhere classified: Secondary | ICD-10-CM | POA: Diagnosis present

## 2017-05-13 DIAGNOSIS — M25541 Pain in joints of right hand: Secondary | ICD-10-CM
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
## 2017-05-16 ENCOUNTER — Encounter: Payer: Self-pay | Admitting: Physical Therapy

## 2017-05-16 ENCOUNTER — Ambulatory Visit: Payer: PRIVATE HEALTH INSURANCE | Admitting: Physical Therapy

## 2017-05-16 DIAGNOSIS — M2560 Stiffness of unspecified joint, not elsewhere classified: Secondary | ICD-10-CM

## 2017-05-16 DIAGNOSIS — M542 Cervicalgia: Secondary | ICD-10-CM

## 2017-05-16 DIAGNOSIS — IMO0002 Reserved for concepts with insufficient information to code with codable children: Secondary | ICD-10-CM

## 2017-05-16 DIAGNOSIS — M79601 Pain in right arm: Secondary | ICD-10-CM

## 2017-05-16 DIAGNOSIS — M25541 Pain in joints of right hand: Secondary | ICD-10-CM
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
## 2017-05-18 ENCOUNTER — Ambulatory Visit: Payer: PRIVATE HEALTH INSURANCE | Admitting: Physical Therapy

## 2017-06-07 ENCOUNTER — Ambulatory Visit: Payer: Self-pay | Attending: Medical | Admitting: Physical Therapy

## 2017-06-07 DIAGNOSIS — IMO0002 Reserved for concepts with insufficient information to code with codable children: Secondary | ICD-10-CM

## 2017-06-07 DIAGNOSIS — M2560 Stiffness of unspecified joint, not elsewhere classified: Secondary | ICD-10-CM | POA: Insufficient documentation

## 2017-06-07 DIAGNOSIS — M542 Cervicalgia: Secondary | ICD-10-CM | POA: Insufficient documentation

## 2017-06-07 DIAGNOSIS — M25541 Pain in joints of right hand: Secondary | ICD-10-CM | POA: Insufficient documentation

## 2017-06-07 DIAGNOSIS — M79601 Pain in right arm: Secondary | ICD-10-CM | POA: Insufficient documentation
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
## 2017-06-28 DIAGNOSIS — Z0279 Encounter for issue of other medical certificate: Secondary | ICD-10-CM

## 2018-06-29 ENCOUNTER — Telehealth: Payer: Self-pay | Admitting: Medical

## 2018-06-29 NOTE — Telephone Encounter (Signed)
Dismissal letter in guarantor snapshot  °
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
## 2020-01-28 ENCOUNTER — Emergency Department (HOSPITAL_COMMUNITY): Payer: PRIVATE HEALTH INSURANCE

## 2020-01-28 ENCOUNTER — Other Ambulatory Visit: Payer: Self-pay

## 2020-01-28 ENCOUNTER — Emergency Department (HOSPITAL_COMMUNITY)
Admission: EM | Admit: 2020-01-28 | Discharge: 2020-01-28 | Disposition: A | Discharge status: Home / Self Care | Payer: PRIVATE HEALTH INSURANCE | Attending: Emergency Medicine | Admitting: Emergency Medicine

## 2020-01-28 DIAGNOSIS — Z7982 Long term (current) use of aspirin: Secondary | ICD-10-CM | POA: Insufficient documentation

## 2020-01-28 DIAGNOSIS — I10 Essential (primary) hypertension: Secondary | ICD-10-CM

## 2020-01-28 DIAGNOSIS — Z79899 Other long term (current) drug therapy: Secondary | ICD-10-CM | POA: Insufficient documentation

## 2020-01-28 DIAGNOSIS — M25561 Pain in right knee: Secondary | ICD-10-CM | POA: Diagnosis present

## 2020-01-28 DIAGNOSIS — M1711 Unilateral primary osteoarthritis, right knee: Secondary | ICD-10-CM | POA: Diagnosis not present

## 2020-01-28 LAB — CBC
HCT: 46.4 % (ref 39.0–52.0)
Hemoglobin: 15.4 g/dL (ref 13.0–17.0)
MCH: 31.2 pg (ref 26.0–34.0)
MCHC: 33.2 g/dL (ref 30.0–36.0)
MCV: 93.9 fL (ref 80.0–100.0)
Platelets: 236 10*3/uL (ref 150–400)
RBC: 4.94 MIL/uL (ref 4.22–5.81)
RDW: 12.6 % (ref 11.5–15.5)
WBC: 8.7 10*3/uL (ref 4.0–10.5)
nRBC: 0 % (ref 0.0–0.2)

## 2020-01-28 LAB — BASIC METABOLIC PANEL
Anion gap: 12 (ref 5–15)
BUN: 15 mg/dL (ref 8–23)
CO2: 25 mmol/L (ref 22–32)
Calcium: 9.7 mg/dL (ref 8.9–10.3)
Chloride: 103 mmol/L (ref 98–111)
Creatinine, Ser: 1.02 mg/dL (ref 0.61–1.24)
GFR calc Af Amer: >60 mL/min (ref >60)
GFR calc non Af Amer: >60 mL/min (ref >60)
Glucose, Bld: 86 mg/dL (ref 70–99)
Potassium: 5.1 mmol/L (ref 3.5–5.1)
Sodium: 140 mmol/L (ref 135–145)

## 2020-01-28 MED ORDER — LOSARTAN POTASSIUM-HCTZ 100-12.5 MG PO TABS: 1.0000 | ORAL_TABLET | Freq: Every day | ORAL | 0 refills | Status: DC

## 2020-01-28 MED ORDER — LABETALOL HCL 5 MG/ML IV SOLN
20.0000 mg | Freq: Once | INTRAVENOUS | Status: AC
  Administered 2020-01-28: 17:00:00 20 mg via INTRAVENOUS
  Filled 2020-01-28: qty 4

## 2020-01-28 MED ORDER — NAPROXEN 500 MG PO TABS: 500.0000 mg | ORAL_TABLET | Freq: Two times a day (BID) | ORAL | 0 refills | Status: DC

## 2020-01-28 MED ORDER — HYDROCODONE-ACETAMINOPHEN 5-325 MG PO TABS: 1.0000 | ORAL_TABLET | Freq: Four times a day (QID) | ORAL | 0 refills | Status: DC | PRN

## 2020-01-28 MED ORDER — LOSARTAN POTASSIUM 50 MG PO TABS
100.0000 mg | ORAL_TABLET | Freq: Once | ORAL | Status: AC
  Administered 2020-01-28: 17:00:00 100 mg via ORAL
  Filled 2020-01-28: qty 2

## 2020-01-28 NOTE — ED Provider Notes (Signed)
Care of the patient assumed at the change of shift. Here for non-traumatic knee pain, noted to be hypertensive. Labs and anti-hypertensives ordered.  Physical Exam  BP (!) 218/130 (BP Location: Right Arm)   Pulse 77   Temp 98.7 F (37.1 C) (Oral)   Resp (!) 24   Ht 6\' 3"  (1.905 m)   Wt (!) 178.7 kg   SpO2 98%   BMI 49.25 kg/m   Physical Exam Sitting in chair, resting comfortably without acute distress ED Course/Procedures   Clinical Course as of Jan 27 1809  Mon Jan 28, 2020  1620 Patient's x-rays show osteoarthritis.  While patient's been in the ED his blood pressure was rechecked and is now 218/130.  Patient indicates that he does have hypertension but has not been taking any medications.  We will give him a dose of antihypertensive agents and check CBC and metabolic panel.  I suspect this is likely chronically poor controlled hypertension   [JK]  1724 CBC and BMP are normal. No signs of end organ damage. Patient given Labetalol and Cozaar, will reassess for improvement in BP.    [CS]  1806 Patient's BP improved minimally, but he is eager to go home and does not want to wait in the ED for further improvements. He would like Rx for his Cozaar and some pain medications for his knee. Advised to follow up with PCP and Ortho for outpatient evaluation.    [CS]    Clinical Course User Index [CS] 1621, MD [JK] Pollyann Savoy, MD    Procedures  MDM        Linwood Dibbles, MD 01/28/20 [phone]

## 2020-01-28 NOTE — ED Notes (Signed)
Provider notified on trending bp

## 2020-01-28 NOTE — ED Triage Notes (Addendum)
Pt arrived via POV, c/o bilateral knee pain x1 months, states right knee is worse currently, last week left knee was more painful. Believes this is a result of being on his feet constantly  at work. Denies any trauma

## 2020-01-28 NOTE — ED Notes (Signed)
Pt verbalized dc instructions and follow up care. Alert and assisted out using wheelchair. No iv. Leaving with wife
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
## 2020-12-23 ENCOUNTER — Ambulatory Visit (INDEPENDENT_AMBULATORY_CARE_PROVIDER_SITE_OTHER): Payer: PRIVATE HEALTH INSURANCE | Admitting: Medical

## 2020-12-23 ENCOUNTER — Other Ambulatory Visit: Payer: Self-pay

## 2020-12-23 VITALS — BP 130/90 | HR 82 | Ht 75.0 in | Wt >= 6400 oz

## 2020-12-23 DIAGNOSIS — Z9119 Patient's noncompliance with other medical treatment and regimen: Secondary | ICD-10-CM | POA: Insufficient documentation

## 2020-12-23 DIAGNOSIS — E786 Lipoprotein deficiency: Secondary | ICD-10-CM

## 2020-12-23 DIAGNOSIS — E785 Hyperlipidemia, unspecified: Secondary | ICD-10-CM | POA: Insufficient documentation

## 2020-12-23 DIAGNOSIS — Z6841 Body Mass Index (BMI) 40.0 and over, adult: Secondary | ICD-10-CM | POA: Insufficient documentation

## 2020-12-23 DIAGNOSIS — I10 Essential (primary) hypertension: Secondary | ICD-10-CM | POA: Diagnosis not present

## 2020-12-23 DIAGNOSIS — Z136 Encounter for screening for cardiovascular disorders: Secondary | ICD-10-CM | POA: Insufficient documentation

## 2020-12-23 DIAGNOSIS — Z91199 Patient's noncompliance with other medical treatment and regimen due to unspecified reason: Secondary | ICD-10-CM | POA: Insufficient documentation

## 2020-12-23 MED ORDER — ROSUVASTATIN CALCIUM 10 MG PO TABS: 10.0000 mg | ORAL_TABLET | Freq: Every day | ORAL | 0 refills | Status: DC

## 2020-12-23 MED ORDER — ASPIRIN 81 MG PO TBEC: 81.0000 mg | DELAYED_RELEASE_TABLET | Freq: Every day | ORAL | 3 refills | Status: DC

## 2020-12-23 MED ORDER — VALSARTAN-HYDROCHLOROTHIAZIDE 160-25 MG PO TABS: 1.0000 | ORAL_TABLET | Freq: Every day | ORAL | 0 refills | Status: DC

## 2020-12-23 NOTE — Progress Notes (Signed)
Subjective:  [name]. is a 65 y.o. male who presents for Chief Complaint  Patient presents with   med check    Med check. Been off of bp med. Not been in since 2018     Here for med check.  Last visit 2018.  He notes that he has just not had a chance to come back in.  He recently was planning to go to the dentist with his blood pressure prevented him from doing any treatment that day.  He notes that he ran out of blood pressure pills months ago.  He denies any chest pain, palpitations, edema.  Overall feels okay.  He has fluctuated with his weight in the past year.  He notes that he was doing better for a while with exercise and diet and lost some weight but now has gained some weight back.  A few years ago he was on losartan HCT.  No other aggravating or relieving factors.    No other c/o.  Past Medical History:  Diagnosis Date   Family history of colon cancer    father -questionable   Hypertension    previous HTN with 400lb weight   Obesity    has been up to 400lb   Pneumonia    Wears glasses    No current outpatient medications on file prior to visit.   No current facility-administered medications on file prior to visit.     The following portions of the patient's history were reviewed and updated as appropriate: allergies, current medications, past family history, past medical history, past social history, past surgical history and problem list.  ROS Otherwise as in subjective above  Objective: BP 130/90   Pulse 82   Ht 6\' 3"  (1.905 m)   Wt (!) 439 lb 12.8 oz (199.5 kg)   SpO2 99%   BMI 54.97 kg/m   General appearance: alert, no distress, well developed, well nourished Neck: supple, no lymphadenopathy, no thyromegaly, no masses, no JVD or bruits Heart: RRR, normal S1, S2, no murmurs Lungs: CTA bilaterally, no wheezes, rhonchi, or rales Pulses: 2+ radial pulses, 2+ pedal pulses, normal cap refill Ext: 1+ bilateral lower extremity  edema    Assessment: Encounter Diagnoses  Name Primary?   Essential hypertension Yes   Noncompliance    Low HDL (under 40)    BMI 50.0-59.9, adult (HCC)    Hyperlipidemia, unspecified hyperlipidemia type    Screening for heart disease      Plan: We discussed his overall health, history of high blood pressure uncontrolled.  Unfortunately he has gained weight back over 400 pounds now.  He was lost to follow-up and has not been back here since 2018   We will restart medications as below.  He has some edema as well today.  We discussed the potential complications of uncontrolled high blood pressure including heart disease, heart failure, stroke, kidney disease and other.  I recommended referral to bariatric clinic.  He will consider/  He declines blood work today  I am going to make a referral to cardiology.  He does not recall ever seeing a cardiologist in the past.  I would like him to have some additional evaluation given his longstanding high blood pressure and noncompliance given his age and increasing weight.   [name] was seen today for med check.  Diagnoses and all orders for this visit:  Essential hypertension -     Ambulatory referral to Cardiology  Noncompliance  Low HDL (under 40)  BMI 50.0-59.9, adult (HCC)  Hyperlipidemia, unspecified hyperlipidemia type  Screening for heart disease  Other orders -     valsartan-hydrochlorothiazide (DIOVAN-HCT) 160-25 MG tablet; Take 1 tablet by mouth daily. -     aspirin 81 MG EC tablet; Take 1 tablet (81 mg total) by mouth daily. Swallow whole. -     rosuvastatin (CRESTOR) 10 MG tablet; Take 1 tablet (10 mg total) by mouth daily.   Follow up: 6- 8 weeks fasting

## 2021-02-19 ENCOUNTER — Ambulatory Visit: Payer: PRIVATE HEALTH INSURANCE | Admitting: Medical

## 2021-02-19 ENCOUNTER — Telehealth: Payer: Self-pay | Admitting: Medical

## 2021-02-19 NOTE — Telephone Encounter (Signed)
This patient no showed for their appointment today.Which of the following is necessary for this patient.   A) No follow-up necessary   B) Follow-up urgent. Locate Patient Immediately.   C) Follow-up necessary. Contact patient and Schedule visit in ____ Days.   D) Follow-up Advised. Contact patient and Schedule visit in ____ Days.   E) Please Send no show letter to patient. Charge no show fee  

## 2021-02-20 ENCOUNTER — Encounter: Payer: Self-pay | Admitting: Medical

## 2021-02-23 ENCOUNTER — Other Ambulatory Visit: Payer: Self-pay | Admitting: Medical

## 2021-03-31 ENCOUNTER — Other Ambulatory Visit: Payer: Self-pay

## 2021-03-31 ENCOUNTER — Ambulatory Visit (INDEPENDENT_AMBULATORY_CARE_PROVIDER_SITE_OTHER): Payer: PRIVATE HEALTH INSURANCE | Admitting: Medical

## 2021-03-31 VITALS — BP 130/88 | HR 85 | Temp 97.5°F | Wt >= 6400 oz

## 2021-03-31 DIAGNOSIS — Z6841 Body Mass Index (BMI) 40.0 and over, adult: Secondary | ICD-10-CM

## 2021-03-31 DIAGNOSIS — E785 Hyperlipidemia, unspecified: Secondary | ICD-10-CM | POA: Diagnosis not present

## 2021-03-31 DIAGNOSIS — Z0000 Encounter for general adult medical examination without abnormal findings: Secondary | ICD-10-CM | POA: Diagnosis not present

## 2021-03-31 DIAGNOSIS — Z125 Encounter for screening for malignant neoplasm of prostate: Secondary | ICD-10-CM

## 2021-03-31 DIAGNOSIS — R7301 Impaired fasting glucose: Secondary | ICD-10-CM

## 2021-03-31 DIAGNOSIS — Z136 Encounter for screening for cardiovascular disorders: Secondary | ICD-10-CM

## 2021-03-31 DIAGNOSIS — Z91199 Patient's noncompliance with other medical treatment and regimen due to unspecified reason: Secondary | ICD-10-CM

## 2021-03-31 DIAGNOSIS — Z1211 Encounter for screening for malignant neoplasm of colon: Secondary | ICD-10-CM

## 2021-03-31 DIAGNOSIS — R635 Abnormal weight gain: Secondary | ICD-10-CM

## 2021-03-31 DIAGNOSIS — R972 Elevated prostate specific antigen [PSA]: Secondary | ICD-10-CM

## 2021-03-31 DIAGNOSIS — I872 Venous insufficiency (chronic) (peripheral): Secondary | ICD-10-CM

## 2021-03-31 DIAGNOSIS — I10 Essential (primary) hypertension: Secondary | ICD-10-CM

## 2021-03-31 DIAGNOSIS — R0989 Other specified symptoms and signs involving the circulatory and respiratory systems: Secondary | ICD-10-CM

## 2021-03-31 DIAGNOSIS — R609 Edema, unspecified: Secondary | ICD-10-CM | POA: Diagnosis not present

## 2021-03-31 DIAGNOSIS — L97909 Non-pressure chronic ulcer of unspecified part of unspecified lower leg with unspecified severity: Secondary | ICD-10-CM

## 2021-03-31 DIAGNOSIS — I83009 Varicose veins of unspecified lower extremity with ulcer of unspecified site: Secondary | ICD-10-CM

## 2021-03-31 DIAGNOSIS — I8393 Asymptomatic varicose veins of bilateral lower extremities: Secondary | ICD-10-CM | POA: Insufficient documentation

## 2021-03-31 MED ORDER — SILVER SULFADIAZINE 1 % EX CREA: 1.0000 "application " | TOPICAL_CREAM | Freq: Every day | CUTANEOUS | 0 refills | Status: DC

## 2021-03-31 MED ORDER — AMOXICILLIN-POT CLAVULANATE 875-125 MG PO TABS: 1.0000 | ORAL_TABLET | Freq: Two times a day (BID) | ORAL | 0 refills | Status: DC

## 2021-03-31 MED ORDER — FUROSEMIDE 20 MG PO TABS: 20.0000 mg | ORAL_TABLET | Freq: Every day | ORAL | 0 refills | Status: DC

## 2021-03-31 MED ORDER — HYDROCODONE-ACETAMINOPHEN 5-325 MG PO TABS: 1.0000 | ORAL_TABLET | Freq: Four times a day (QID) | ORAL | 0 refills | Status: DC | PRN

## 2021-03-31 NOTE — Progress Notes (Signed)
Subjective:   HPI  [name]. is a 65 y.o. male who presents for Chief Complaint  Patient presents with   sore on right ankle    Sore above right ankle. Having some woozing and painful been there about a month   Hypertension   Annual Exam    Patient Care Team: Evaleigh Mccamy, Cleda Mccreedy as PCP - General (Family Medicine) Sees dentist Sees eye doctor Dr. Erick Blinks, GI   Of note, we have made referrals to cardiology back in August 2022 and urology back in 2018 he failed to go to either 1 of these referrals   Concerns: Here today for sore on leg but he has help to come in for physical and routine labs.  Has not had labs in the last year.  This we went ahead and saw him for preventative care visit as well as his acute issues as he is due for all of these  He is here for an ulcer/sore on the left leg.  Been there about a month that is getting worse.  It is oozing some.  It is painful and the leg is swollen and warm.  He denies fever body aches or chills.  No nausea or vomiting.  No prior ulcer.  The left leg is quite tender.  He has swelling in both legs that is ongoing.  Symptoms have worsened in the last week  He is currently taking his blood pressure pill valsartan HCT, cholesterol pill and aspirin.  He denies chest pain, palpitations.  He does not smoke.  No heavy alcohol use.  He tries to limit salt.  He notes home blood pressures are normal.  No polyuria, no polydipsia, no blurred vision.    Reviewed their medical, surgical, family, social, medication, and allergy history and updated chart as appropriate.  Past Medical History:  Diagnosis Date   Family history of colon cancer    father -questionable   Hypertension    previous HTN with 400lb weight   Obesity    has been up to 400lb   Pneumonia    Wears glasses     Past Surgical History:  Procedure Laterality Date   unremarkable      Family History  Problem Relation Age of Onset   Cancer Mother 42        metastases-unaware as to type of ca   Obesity Mother    Colon cancer Father 24       questionable   Liver disease Father    Alcohol abuse Father    Heart disease Neg Hx    Hypertension Neg Hx    Hyperlipidemia Neg Hx    Stroke Neg Hx    Diabetes Neg Hx    Esophageal cancer Neg Hx    Rectal cancer Neg Hx      Current Outpatient Medications:    amoxicillin-clavulanate (AUGMENTIN) 875-125 MG tablet, Take 1 tablet by mouth 2 (two) times daily., Disp: 20 tablet, Rfl: 0   aspirin 81 MG EC tablet, Take 1 tablet (81 mg total) by mouth daily. Swallow whole., Disp: 90 tablet, Rfl: 3   furosemide (LASIX) 20 MG tablet, Take 1 tablet (20 mg total) by mouth daily., Disp: 30 tablet, Rfl: 0   HYDROcodone-acetaminophen (NORCO) 5-325 MG tablet, Take 1 tablet by mouth every 6 (six) hours as needed., Disp: 12 tablet, Rfl: 0   rosuvastatin (CRESTOR) 10 MG tablet, TAKE 1 TABLET BY MOUTH EVERY DAY, Disp: 30 tablet, Rfl: 2   silver  sulfADIAZINE (SILVADENE) 1 % cream, Apply 1 application topically daily., Disp: 50 g, Rfl: 0   valsartan-hydrochlorothiazide (DIOVAN-HCT) 160-25 MG tablet, TAKE 1 TABLET BY MOUTH EVERY DAY, Disp: 30 tablet, Rfl: 2  No Known Allergies   Review of Systems Constitutional: -fever, -chills, -sweats, -unexpected weight change, -decreased appetite, -fatigue Allergy: -sneezing, -itching, -congestion Dermatology: -changing moles, +rash, -lumps ENT: -runny nose, -ear pain, -sore throat, -hoarseness, -sinus pain, -teeth pain, - ringing in ears, -hearing loss, -nosebleeds Cardiology: -chest pain, -palpitations, +swelling, -difficulty breathing when lying flat, -waking up short of breath Respiratory: -cough, -shortness of breath, -difficulty breathing with exercise or exertion, -wheezing, -coughing up blood Gastroenterology: -abdominal pain, -nausea, -vomiting, -diarrhea, -constipation, -blood in stool, -changes in bowel movement, -difficulty swallowing or eating Hematology: -bleeding,  -bruising  Musculoskeletal: -joint aches, -muscle aches, -joint swelling, -back pain, -neck pain, -cramping, -changes in gait Ophthalmology: denies vision changes, eye redness, itching, discharge Urology: -burning with urination, -difficulty urinating, -blood in urine, -urinary frequency, -urgency, -incontinence Neurology: -headache, -weakness, -tingling, -numbness, -memory loss, -falls, -dizziness Psychology: -depressed mood, -agitation, -sleep problems Male GU: no testicular mass, pain, no lymph nodes swollen, no swelling, no rash.  Depression screen [facility] 2/9 03/31/2021 12/23/2020 06/25/2016  Decreased Interest 0 0 0  Down, Depressed, Hopeless 0 0 0  PHQ - 2 Score 0 0 0        Objective:  BP 130/88   Pulse 85   Temp (!) 97.5 F (36.4 C)   Wt (!) 425 lb (192.8 kg)   BMI 53.12 kg/m   General appearance: alert, no distress, WD/WN, African American male morbidly obese Skin: left lateral lower leg distal 1/3 with 3cm diameter ulcer approx 1-83mm deep, and general warmth to the left lower leg HEENT: normocephalic, conjunctiva/corneas normal, sclerae anicteric, PERRLA, EOMi Neck: supple, no lymphadenopathy, no thyromegaly, no masses, normal ROM, no bruits, no JVD Chest: non tender, normal shape and expansion Heart: RRR, normal S1, S2, no murmurs Lungs: CTA bilaterally, no wheezes, rhonchi, or rales Abdomen: +bs, soft, non tender, non distended, no masses, no hepatomegaly, no splenomegaly, no bruits Back: non tender, normal ROM, no scoliosis Musculoskeletal: upper extremities non tender, no obvious deformity, normal ROM throughout, lower extremities non tender, no obvious deformity, normal ROM throughout Extremities: 2+ bilat pitting LE edema, no cyanosis, no clubbing Pulses: 2+ symmetric, upper and 1+ bilat lower extremities, mildly delayed cap refill bilat, left foot and lower leg swollen worse than right , but bilat pitting edema Neurological: alert, oriented x 3, CN2-12 intact,  strength normal upper extremities and lower extremities, sensation normal throughout, DTRs 2+ throughout, no cerebellar signs, gait normal Psychiatric: normal affect, behavior normal, pleasant  GU/rectal - deferred  EKG Indication, edema, high blood pressure, screening for heart disease, or swelling Rate 81 bpm, PR 160 ms, QRS 82 ms, QTC 473 ms, axis 9 degrees, normal sinus rhythm, possible left atrial enlargement, possible Q in 3    Assessment and Plan :   Encounter Diagnoses  Name Primary?   Encounter for health maintenance examination in adult Yes   Screening for heart disease    Hyperlipidemia, unspecified hyperlipidemia type    Essential hypertension    BMI 50.0-59.9, adult (HCC)    Edema, unspecified type    Venous ulcer (HCC)    Elevated PSA    Impaired fasting blood sugar    Noncompliance    Weight gain    Venous insufficiency    Varicose veins of both lower extremities, unspecified whether complicated  Screen for colon cancer    Screening for prostate cancer    Decreased pedal pulses     This visit was a preventative care visit, also known as wellness visit or routine physical.   Topics typically include healthy lifestyle, diet, exercise, preventative care, vaccinations, sick and well care, proper use of emergency dept and after hours care, as well as other concerns.     Recommendations: Continue to return yearly for your annual wellness and preventative care visits.  This gives [place] a chance to discuss healthy lifestyle, exercise, vaccinations, review your chart record, and perform screenings where appropriate.  I recommend you see your eye doctor yearly for routine vision care.  I recommend you see your dentist yearly for routine dental care including hygiene visits twice yearly.   Vaccination recommendations were reviewed Immunization History  Administered Date(s) Administered   Influenza,inj,Quad PF,6+ Mos 04/11/2014, 03/30/2017   PPD Test 06/07/2012    Tdap 06/07/2012    I recommend yearly flu shot, shingles vaccine, pneumococcal vaccine  Let me know if agreeable, and check insurance for coverage for these vaccines.   For example, I recommend Prevnar 20 vaccine and flu shot NOW.   Screening for cancer: Colon cancer screening: I recommend colon cancer screen.  Let me know if agreeable to Cologuard vs colonoscopy  We discussed PSA, prostate exam, and prostate cancer screening risks/benefits.     Skin cancer screening: Check your skin regularly for new changes, growing lesions, or other lesions of concern Come in for evaluation if you have skin lesions of concern.  Lung cancer screening: If you have a greater than 20 pack year history of tobacco use, then you may qualify for lung cancer screening with a chest CT scan.   Please call your insurance company to inquire about coverage for this test.  We currently don't have screenings for other cancers besides breast, cervical, colon, and lung cancers.  If you have a strong family history of cancer or have other cancer screening concerns, please let me know.    Bone health: Get at least 150 minutes of aerobic exercise weekly Get weight bearing exercise at least once weekly Bone density test:  A bone density test is an imaging test that uses a type of X-ray to measure the amount of calcium and other minerals in your bones. The test may be used to diagnose or screen you for a condition that causes weak or thin bones (osteoporosis), predict your risk for a broken bone (fracture), or determine how well your osteoporosis treatment is working. The bone density test is recommended for females 65 and older, or females or males <65 if certain risk factors such as thyroid disease, long term use of steroids such as for asthma or rheumatological issues, vitamin D deficiency, estrogen deficiency, family history of osteoporosis, self or family history of fragility fracture in first degree  relative.    Heart health: Get at least 150 minutes of aerobic exercise weekly Limit alcohol It is important to maintain a healthy blood pressure and healthy cholesterol numbers  Heart disease screening: Screening for heart disease includes screening for blood pressure, fasting lipids, glucose/diabetes screening, BMI height to weight ratio, reviewed of smoking status, physical activity, and diet.    Goals include blood pressure 120/80 or less, maintaining a healthy lipid/cholesterol profile, preventing diabetes or keeping diabetes numbers under good control, not smoking or using tobacco products, exercising most days per week or at least 150 minutes per week of exercise, and eating healthy  variety of fruits and vegetables, healthy oils, and avoiding unhealthy food choices like fried food, fast food, high sugar and high cholesterol foods.    I referred you to cardiology 12/2020.  I strongly recommend consult with cardiology   Medical care options: I recommend you continue to seek care here first for routine care.  We try really hard to have available appointments Monday through Friday daytime hours for sick visits, acute visits, and physicals.  Urgent care should be used for after hours and weekends for significant issues that cannot wait till the next day.  The emergency department should be used for significant potentially life-threatening emergencies.  The emergency department is expensive, can often have long wait times for less significant concerns, so try to utilize primary care, urgent care, or telemedicine when possible to avoid unnecessary trips to the emergency department.  Virtual visits and telemedicine have been introduced since the pandemic started in 2020, and can be convenient ways to receive medical care.  We offer virtual appointments as well to assist you in a variety of options to seek medical care.   Advanced Directives: I recommend you consider completing a Health Care Power  of Attorney and Living Will.   These documents respect your wishes and help alleviate burdens on your loved ones if you were to become terminally ill or be in a position to need those documents enforced.    You can complete Advanced Directives yourself, have them notarized, then have copies made for our office, for you and for anybody you feel should have them in safe keeping.  Or, you can have an attorney prepare these documents.   If you haven't updated your Last Will and Testament in a while, it may be worthwhile having an attorney prepare these documents together and save on some costs.       Separate significant issues discussed: Unfortunately there has been a great deal of noncompliance with this patient.  I changed his visit today to a wellness physical as well as acute issues given the fact that he needed many things evaluated today  Venous stasis ulcer left lower leg, edema/swelling of the legs Begin antibiotic for likely cellulitis, begin Augmentin twice daily for 10 days Begin Silvadene cream topically to the wound daily.  Keep wound clean and dry.  Cover with bandage Begin pain medicine hydrocodone as needed every 4-6 hours.  Caution as this can cause sedation Begin Lasix 20 mg daily fluid pill in the morning for the next 4 to 5 days We will need to recheck the ulcer later this week Elevate your legs when possible to help with the swelling  High blood pressure Updated labs today Continue valsartan HCT 160/25 mg daily Pending labs we may need to modify this regimen Limit salt Drink 80 to 100 ounces of water daily and keep your liquid intake consistent  High cholesterol Continue rosuvastatin Crestor 10 mg daily Continue baby aspirin 81 mg daily   Obesity Work on efforts to lose weight through exercise and healthy eating habits We may need to consider medication to help with weight loss or consider weight loss surgery or referral to a pharmaceutical study that is available  currently to help with weight loss  History of elevated PSA, screen for prostate cancer You have had elevated PSA prostate cancer screen in the past.  I am rechecking this today.  You will likely need to see urology  Varicose veins, venous insufficiency, decreased pulses in the feet and now a new  ulcer-referral to vascular surgery clinic for evaluation    [name] was seen today for sore on right ankle, hypertension and annual exam.  Diagnoses and all orders for this visit:  Encounter for health maintenance examination in adult -     Comprehensive metabolic panel -     CBC with Differential/Platelet -     Hemoglobin A1c -     Brain natriuretic peptide -     Lipid panel -     EKG 12-Lead -     PSA  Screening for heart disease -     EKG 12-Lead  Hyperlipidemia, unspecified hyperlipidemia type -     Lipid panel  Essential hypertension -     EKG 12-Lead -     Ambulatory referral to Vascular Surgery  BMI 50.0-59.9, adult (HCC)  Edema, unspecified type -     Comprehensive metabolic panel -     Brain natriuretic peptide -     Ambulatory referral to Vascular Surgery  Venous ulcer (HCC) -     CBC with Differential/Platelet -     Ambulatory referral to Vascular Surgery  Elevated PSA -     PSA  Impaired fasting blood sugar -     Hemoglobin A1c  Noncompliance  Weight gain  Venous insufficiency -     Ambulatory referral to Vascular Surgery  Varicose veins of both lower extremities, unspecified whether complicated  Screen for colon cancer  Screening for prostate cancer  Decreased pedal pulses -     Ambulatory referral to Vascular Surgery  Other orders -     HYDROcodone-acetaminophen (NORCO) 5-325 MG tablet; Take 1 tablet by mouth every 6 (six) hours as needed. -     amoxicillin-clavulanate (AUGMENTIN) 875-125 MG tablet; Take 1 tablet by mouth 2 (two) times daily. -     silver sulfADIAZINE (SILVADENE) 1 % cream; Apply 1 application topically daily. -     furosemide  (LASIX) 20 MG tablet; Take 1 tablet (20 mg total) by mouth daily.    Follow-up pending labs, yearly for physical

## 2021-04-01 ENCOUNTER — Other Ambulatory Visit: Payer: Self-pay | Admitting: Medical

## 2021-04-01 DIAGNOSIS — R972 Elevated prostate specific antigen [PSA]: Secondary | ICD-10-CM

## 2021-04-01 DIAGNOSIS — M7989 Other specified soft tissue disorders: Secondary | ICD-10-CM

## 2021-04-01 LAB — COMPREHENSIVE METABOLIC PANEL
ALT: 16 IU/L (ref 0–44)
AST: 25 IU/L (ref 0–40)
Albumin/Globulin Ratio: 1.3 (ref 1.2–2.2)
Albumin: 4.3 g/dL (ref 3.8–4.8)
Alkaline Phosphatase: 100 IU/L (ref 44–121)
BUN/Creatinine Ratio: 12 (ref 10–24)
BUN: 12 mg/dL (ref 8–27)
Bilirubin Total: 0.6 mg/dL (ref 0.0–1.2)
CO2: 26 mmol/L (ref 20–29)
Calcium: 9.7 mg/dL (ref 8.6–10.2)
Chloride: 102 mmol/L (ref 96–106)
Creatinine, Ser: 1.02 mg/dL (ref 0.76–1.27)
Globulin, Total: 3.4 g/dL (ref 1.5–4.5)
Glucose: 91 mg/dL (ref 70–99)
Potassium: 4.2 mmol/L (ref 3.5–5.2)
Sodium: 145 mmol/L — ABNORMAL HIGH (ref 134–144)
Total Protein: 7.7 g/dL (ref 6.0–8.5)
eGFR: 83 mL/min/{1.73_m2} (ref >59)

## 2021-04-01 LAB — CBC WITH DIFFERENTIAL/PLATELET
Basophils Absolute: 0 10*3/uL (ref 0.0–0.2)
Basos: 0 %
EOS (ABSOLUTE): 0.2 10*3/uL (ref 0.0–0.4)
Eos: 2 %
Hematocrit: 41.5 % (ref 37.5–51.0)
Hemoglobin: 14.3 g/dL (ref 13.0–17.7)
Immature Grans (Abs): 0 10*3/uL (ref 0.0–0.1)
Immature Granulocytes: 0 %
Lymphocytes Absolute: 2.1 10*3/uL (ref 0.7–3.1)
Lymphs: 30 %
MCH: 30.8 pg (ref 26.6–33.0)
MCHC: 34.5 g/dL (ref 31.5–35.7)
MCV: 89 fL (ref 79–97)
Monocytes Absolute: 0.6 10*3/uL (ref 0.1–0.9)
Monocytes: 8 %
Neutrophils Absolute: 4.2 10*3/uL (ref 1.4–7.0)
Neutrophils: 60 %
Platelets: 249 10*3/uL (ref 150–450)
RBC: 4.64 x10E6/uL (ref 4.14–5.80)
RDW: 12.7 % (ref 11.6–15.4)
WBC: 7.1 10*3/uL (ref 3.4–10.8)

## 2021-04-01 LAB — PSA: Prostate Specific Ag, Serum: 28.8 ng/mL — ABNORMAL HIGH (ref 0.0–4.0)

## 2021-04-01 LAB — HEMOGLOBIN A1C
Est. average glucose Bld gHb Est-mCnc: 126 mg/dL
Hgb A1c MFr Bld: 6 % — ABNORMAL HIGH (ref 4.8–5.6)

## 2021-04-01 LAB — LIPID PANEL
Chol/HDL Ratio: 2.8 ratio (ref 0.0–5.0)
Cholesterol, Total: 166 mg/dL (ref 100–199)
HDL: 59 mg/dL (ref >39)
LDL Chol Calc (NIH): 89 mg/dL (ref 0–99)
Triglycerides: 98 mg/dL (ref 0–149)
VLDL Cholesterol Cal: 18 mg/dL (ref 5–40)

## 2021-04-01 LAB — BRAIN NATRIURETIC PEPTIDE: BNP: 19.4 pg/mL (ref 0.0–100.0)

## 2021-04-01 MED ORDER — VALSARTAN-HYDROCHLOROTHIAZIDE 320-12.5 MG PO TABS: 1.0000 | ORAL_TABLET | Freq: Every day | ORAL | 3 refills | Status: DC

## 2021-04-01 NOTE — Progress Notes (Signed)
o

## 2021-04-06 ENCOUNTER — Other Ambulatory Visit: Payer: Self-pay

## 2021-04-06 ENCOUNTER — Ambulatory Visit (INDEPENDENT_AMBULATORY_CARE_PROVIDER_SITE_OTHER): Payer: PRIVATE HEALTH INSURANCE | Admitting: Medical

## 2021-04-06 VITALS — BP 120/88 | HR 72 | Temp 98.2°F | Wt >= 6400 oz

## 2021-04-06 DIAGNOSIS — R7301 Impaired fasting glucose: Secondary | ICD-10-CM

## 2021-04-06 DIAGNOSIS — R972 Elevated prostate specific antigen [PSA]: Secondary | ICD-10-CM

## 2021-04-06 DIAGNOSIS — I10 Essential (primary) hypertension: Secondary | ICD-10-CM

## 2021-04-06 DIAGNOSIS — R609 Edema, unspecified: Secondary | ICD-10-CM

## 2021-04-06 DIAGNOSIS — I83009 Varicose veins of unspecified lower extremity with ulcer of unspecified site: Secondary | ICD-10-CM

## 2021-04-06 DIAGNOSIS — I872 Venous insufficiency (chronic) (peripheral): Secondary | ICD-10-CM | POA: Diagnosis not present

## 2021-04-06 DIAGNOSIS — I8393 Asymptomatic varicose veins of bilateral lower extremities: Secondary | ICD-10-CM | POA: Diagnosis not present

## 2021-04-06 DIAGNOSIS — R0989 Other specified symptoms and signs involving the circulatory and respiratory systems: Secondary | ICD-10-CM

## 2021-04-06 DIAGNOSIS — L97909 Non-pressure chronic ulcer of unspecified part of unspecified lower leg with unspecified severity: Secondary | ICD-10-CM

## 2021-04-06 NOTE — Addendum Note (Signed)
Addended by: Jac Canavan on: 04/06/2021 03:46 PM   Modules accepted: Level of Service

## 2021-04-06 NOTE — Progress Notes (Signed)
Subjective:   HPI  [name]. is a 65 y.o. male who presents for Chief Complaint  Patient presents with   Follow-up    Follow-up. Sore is still sore and swelling going down    Patient Care Team: Aiana Nordquist, Cleda Mccreedy as PCP - General (Family Medicine) Sees dentist Sees eye doctor Dr. Erick Blinks, GI  I saw him on 03/31/2021.  I advise he follow-up in a week.  Today is a follow-up.  On his recent visit he complained of wound or sore on left leg, leg swelling, leg pain. Been there about a month that was getting worse.  Had some oozing.  Had some pain and swelling but no fever or body aches or chills, no nausea vomiting. No prior ulcer.  The left leg was quite tender.  He has swelling in both legs that is ongoing.    He started the Lasix from last visit as well as the Silvadene cream and antibiotic.  He does note improvement  Reviewed their medical, surgical, family, social, medication, and allergy history and updated chart as appropriate.  Past Medical History:  Diagnosis Date   Family history of colon cancer    father -questionable   Hypertension    previous HTN with 400lb weight   Obesity    has been up to 400lb   Pneumonia    Wears glasses     Past Surgical History:  Procedure Laterality Date   unremarkable       Current Outpatient Medications:    amoxicillin-clavulanate (AUGMENTIN) 875-125 MG tablet, Take 1 tablet by mouth 2 (two) times daily., Disp: 20 tablet, Rfl: 0   aspirin 81 MG EC tablet, Take 1 tablet (81 mg total) by mouth daily. Swallow whole., Disp: 90 tablet, Rfl: 3   furosemide (LASIX) 20 MG tablet, Take 1 tablet (20 mg total) by mouth daily., Disp: 30 tablet, Rfl: 0   HYDROcodone-acetaminophen (NORCO) 5-325 MG tablet, Take 1 tablet by mouth every 6 (six) hours as needed., Disp: 12 tablet, Rfl: 0   rosuvastatin (CRESTOR) 10 MG tablet, TAKE 1 TABLET BY MOUTH EVERY DAY, Disp: 30 tablet, Rfl: 2   silver sulfADIAZINE (SILVADENE) 1 % cream, Apply 1  application topically daily., Disp: 50 g, Rfl: 0   valsartan-hydrochlorothiazide (DIOVAN-HCT) 320-12.5 MG tablet, Take 1 tablet by mouth daily., Disp: 90 tablet, Rfl: 3  No Known Allergies   Review of systems as in subjective  Objective:  BP 120/88   Pulse 72   Temp 98.2 F (36.8 C)   Wt (!) 419 lb (190.1 kg)   BMI 52.37 kg/m   Wt Readings from Last 3 Encounters:  04/06/21 (!) 419 lb (190.1 kg)  03/31/21 (!) 425 lb (192.8 kg)  12/23/20 (!) 439 lb 12.8 oz (199.5 kg)    General appearance: alert, no distress, WD/WN, African American male morbidly obese Skin: left lateral lower leg distal 1/3 with 3cm diameter ulcer approx 1-36mm deep, and improvements from last visit last week, no current wamrth or redness Neck: supple, no lymphadenopathy, no thyromegaly, no masses, normal ROM, no bruits, no JVD Heart: RRR, normal S1, S2, no murmurs Lungs: CTA bilaterally, no wheezes, rhonchi, or rales Musculoskeletal: upper extremities non tender, no obvious deformity, normal ROM throughout, lower extremities non tender, no obvious deformity, normal ROM throughout Extremities: 1+ bilat pitting LE edema on left lower extremity, no cyanosis, no clubbing Pulses: 2+ symmetric, upper and 1+ bilat lower extremities, mildly delayed cap refill bilat, left foot and lower  leg swollen worse than right , but improved from last week    Assessment and Plan :   Encounter Diagnoses  Name Primary?   Venous ulcer (HCC) Yes   Venous insufficiency    Varicose veins of both lower extremities, unspecified whether complicated    Impaired fasting blood sugar    Essential hypertension    Elevated PSA    Edema, unspecified type    Decreased pedal pulses    We reviewed his recent labs from his last visit last week.  Elevated PSA-he has seen urology before about this issue.  I believe he was lost to follow-up regarding biopsy.  We will refer him back at this time.  I strongly encouraged him to follow through with  the biopsies  Hypertension-he has not picked up the higher dose valsartan HCT called out last visit.  Advised to go ahead and start this  Venous insufficiency, varicose veins-continue plan for referral to vein clinic.  Leg swelling and ulcer-improving.   Continue Silvadene cream for another 3 to 5 days.  Once scab starts can stop the cream.  Finish out the antibiotic.  Can use the pain medicine prescribed last visit as needed.  Continue plan for ultrasound of leg.  Continue Lasix 3 more days at the current dose then change to 1/2 tablet daily for 1 week.  Call report in 1 week or recheck in person  Obesity Work on efforts to lose weight through exercise and healthy eating habits I gave options to begin medication to help with weight loss-referral Saxenda.  He does not want to do this currently   Loranzo was seen today for follow-up.  Diagnoses and all orders for this visit:  Venous ulcer (HCC)  Venous insufficiency  Varicose veins of both lower extremities, unspecified whether complicated  Impaired fasting blood sugar  Essential hypertension  Elevated PSA  Edema, unspecified type  Decreased pedal pulses  Follow-up pending ultrasound of leg, referral to vascular surgery clinic/vein clinic and referral to urology

## 2021-04-06 NOTE — Patient Instructions (Addendum)
Leg swelling and ulcer Your wound is improving Finish Augmentin antiboitc Continue silvadene cream antoehr 5-7 days until scab forms Continue plan to see vascular /vein specialist about leg swelling and ulcer in general Continue plan for ultrasound.  Call back ASAP to schedule Continue to elevated leg as well Use Lasix/Furosemide 20mg , 1 tablet daily through Wednesday, then change to 1/2 tablet daily for another 5-7 days The goal is to get a little more improvement on the swelling   Elevated PSA prostate marker Expect a call back about appointment to see Urology   Call me back in 1 week to inform me about your leg status

## 2021-04-08 ENCOUNTER — Telehealth: Payer: Self-pay

## 2021-04-08 NOTE — Telephone Encounter (Signed)
Nurse called from [facility] stating that they had to cancel the pts. Venous US apt. Because it has to be scheduled at [facility] through a different dept. And the PCP has to call to schedule it. He gave me a phone number of [phone].

## 2021-04-13 ENCOUNTER — Ambulatory Visit (HOSPITAL_COMMUNITY)
Admission: RE | Admit: 2021-04-13 | Discharge: 2021-04-13 | Disposition: A | Discharge status: Home / Self Care | Payer: PRIVATE HEALTH INSURANCE | Source: Ambulatory Visit | Attending: Medical | Admitting: Medical

## 2021-04-13 ENCOUNTER — Other Ambulatory Visit: Payer: Self-pay

## 2021-04-13 ENCOUNTER — Ambulatory Visit (HOSPITAL_COMMUNITY): Payer: BLUE CROSS/BLUE SHIELD

## 2021-04-13 DIAGNOSIS — M7989 Other specified soft tissue disorders: Secondary | ICD-10-CM

## 2021-04-13 NOTE — Progress Notes (Addendum)
Preliminary results given to [place].  Left lower ext venoushas been completed. Refer to [facility] under chart review to view preliminary results.   04/13/2021  1:58 PM Nicey Krah, Gerarda Gunther

## 2021-04-15 ENCOUNTER — Other Ambulatory Visit: Payer: Self-pay | Admitting: Medical

## 2021-04-15 MED ORDER — POTASSIUM CHLORIDE ER 10 MEQ PO TBCR: 10.0000 meq | EXTENDED_RELEASE_TABLET | Freq: Every day | ORAL | 1 refills | Status: DC

## 2021-04-15 MED ORDER — FUROSEMIDE 20 MG PO TABS: 20.0000 mg | ORAL_TABLET | Freq: Every day | ORAL | 1 refills | Status: DC

## 2021-04-15 MED ORDER — HYDROCODONE-ACETAMINOPHEN 5-325 MG PO TABS: 1.0000 | ORAL_TABLET | Freq: Four times a day (QID) | ORAL | 0 refills | Status: DC | PRN

## 2021-04-15 NOTE — Telephone Encounter (Signed)
Disregard. Pt already had this done

## 2021-04-22 ENCOUNTER — Other Ambulatory Visit: Payer: Self-pay | Admitting: Medical

## 2021-05-07 ENCOUNTER — Other Ambulatory Visit: Payer: Self-pay

## 2021-05-07 ENCOUNTER — Telehealth: Payer: Self-pay | Admitting: Medical

## 2021-05-07 ENCOUNTER — Ambulatory Visit (INDEPENDENT_AMBULATORY_CARE_PROVIDER_SITE_OTHER): Payer: PRIVATE HEALTH INSURANCE | Admitting: Medical

## 2021-05-07 VITALS — BP 120/70 | HR 104 | Temp 98.3°F | Wt >= 6400 oz

## 2021-05-07 DIAGNOSIS — Z6841 Body Mass Index (BMI) 40.0 and over, adult: Secondary | ICD-10-CM | POA: Diagnosis not present

## 2021-05-07 DIAGNOSIS — R7301 Impaired fasting glucose: Secondary | ICD-10-CM

## 2021-05-07 DIAGNOSIS — I872 Venous insufficiency (chronic) (peripheral): Secondary | ICD-10-CM

## 2021-05-07 DIAGNOSIS — L97909 Non-pressure chronic ulcer of unspecified part of unspecified lower leg with unspecified severity: Secondary | ICD-10-CM

## 2021-05-07 DIAGNOSIS — I10 Essential (primary) hypertension: Secondary | ICD-10-CM

## 2021-05-07 DIAGNOSIS — I83009 Varicose veins of unspecified lower extremity with ulcer of unspecified site: Secondary | ICD-10-CM

## 2021-05-07 DIAGNOSIS — I8393 Asymptomatic varicose veins of bilateral lower extremities: Secondary | ICD-10-CM

## 2021-05-07 DIAGNOSIS — E785 Hyperlipidemia, unspecified: Secondary | ICD-10-CM

## 2021-05-07 DIAGNOSIS — R972 Elevated prostate specific antigen [PSA]: Secondary | ICD-10-CM

## 2021-05-07 MED ORDER — HYDROCODONE-ACETAMINOPHEN 5-325 MG PO TABS: 1.0000 | ORAL_TABLET | Freq: Two times a day (BID) | ORAL | 0 refills | Status: DC | PRN

## 2021-05-07 MED ORDER — CEPHALEXIN 500 MG PO CAPS: 500.0000 mg | ORAL_CAPSULE | Freq: Three times a day (TID) | ORAL | 0 refills | Status: DC

## 2021-05-07 MED ORDER — UNNA-FLEX ELASTIC UNNA BOOT EX MISC: 2.0000 | CUTANEOUS | 1 refills | Status: DC

## 2021-05-07 NOTE — Telephone Encounter (Signed)
Pt was notified. He only received 2 medications. He will check tomorrow with pharmacy about [facility] boot.  Pt would like to try pill form of medication for sugars. Please send in

## 2021-05-07 NOTE — Progress Notes (Signed)
Subjective:   HPI  [name]. is a 66 y.o. male who presents for Chief Complaint  Patient presents with   Follow-up    Follow-up on leg. Still having pain and swelling    Patient Care Team: Chesky Heyer, Leward Quan as PCP - General (Family Medicine) Sees dentist Sees eye doctor Dr. Zenovia Jarred, GI Pending consults with urology and vascular surgery  Here for recheck on legs.  I saw him in December for left leg sore, wound, bilateral leg swelling.  We initially used trial of oral antibiotic, leg elevation, diuretic and Silvadene cream.  He had some initial improvement but now at this point is about the same as when he came in initially.  No major improvement overall  No fever, no body aches or chills, no significant redness.  Just ongoing swelling and pain and ulceration  He did hear back from vascular surgery and his appointment is scheduled for new consult on 05/25/21  Regarding elevated PSA-he has urology appointment on May 27, 2021  Reviewed their medical, surgical, family, social, medication, and allergy history and updated chart as appropriate.  Past Medical History:  Diagnosis Date   Family history of colon cancer    father -questionable   Hypertension    previous HTN with 400lb weight   Obesity    has been up to 400lb   Pneumonia    Wears glasses     Past Surgical History:  Procedure Laterality Date   unremarkable       Current Outpatient Medications:    aspirin 81 MG EC tablet, Take 1 tablet (81 mg total) by mouth daily. Swallow whole., Disp: 90 tablet, Rfl: 3   cephALEXin (KEFLEX) 500 MG capsule, Take 1 capsule (500 mg total) by mouth 3 (three) times daily., Disp: 30 capsule, Rfl: 0   rosuvastatin (CRESTOR) 10 MG tablet, TAKE 1 TABLET BY MOUTH EVERY DAY, Disp: 30 tablet, Rfl: 2   valsartan-hydrochlorothiazide (DIOVAN-HCT) 320-12.5 MG tablet, Take 1 tablet by mouth daily., Disp: 90 tablet, Rfl: 3   Wound Dressings (UNNA-FLEX ELASTIC UNNA BOOT) MISC,  Apply 2 each topically once a week., Disp: 2 each, Rfl: 1   HYDROcodone-acetaminophen (NORCO) 5-325 MG tablet, Take 1 tablet by mouth every 12 (twelve) hours as needed., Disp: 20 tablet, Rfl: 0  No Known Allergies   Review of systems as in subjective    Objective:  BP 120/70    Pulse (!) 104    Temp 98.3 F (36.8 C)    Wt (!) 420 lb (190.5 kg)    BMI 52.50 kg/m   Wt Readings from Last 3 Encounters:  05/07/21 (!) 420 lb (190.5 kg)  04/06/21 (!) 419 lb (190.1 kg)  03/31/21 (!) 425 lb (192.8 kg)    General appearance: alert, no distress, WD/WN, African American male morbidly obese Skin: left lateral lower leg distal 1/3 with 3.5cm x 6.5 cm squarish  ulcer approx 1-59mm deep, and with some crust, some erythema and some areas of slight oozing of creamy discharge, no current warmth or redness Neck: supple, no lymphadenopathy, no thyromegaly, no masses, normal ROM, no bruits, no JVD Heart: RRR, normal S1, S2, no murmurs Lungs: CTA bilaterally, no wheezes, rhonchi, or rales Musculoskeletal: upper extremities non tender, no obvious deformity, normal ROM throughout, lower extremities non tender, no obvious deformity, normal ROM throughout Extremities: 1+ bilat nonpitting LE edema on bilat lower extremity, no cyanosis, no clubbing, varicose veins of both lower legs Pulses: 2+ symmetric, upper and 1+  bilat lower extremities, mildly delayed cap refill bilat, left foot and lower leg swollen worse than right , but improved from last week    Assessment and Plan :   Encounter Diagnoses  Name Primary?   Venous ulcer ([place]) Yes   Venous insufficiency    Varicose veins of both lower extremities, unspecified whether complicated    BMI 99991111, adult (HCC)    Essential hypertension    Hyperlipidemia, unspecified hyperlipidemia type    Impaired fasting blood sugar    Elevated PSA     Venous ulcer, venous insufficiency, varicose veins, stasis ulcer-I called vascular surgery.  They cannot get  him anything for appointment.  We both agreed to try [facility] boot therapy.  Begin Unna boot for 5 days with repeats once or twice.  Continue leg elevation.  Begin round of Keflex oral antibiotic.  Begin pain medicine as needed.  Follow-up as planned with vascular surgery office  Elevated PSA-pending consult with urology soon  Hypertension-continue current medication valsartan HCT  Hyperlipidemia-continue statin and aspirin daily  Impaired glucose-need to work on losing weight through healthy diet and exercise.  Offered medication to help stabilize sugars and help weight loss   Trygg was seen today for follow-up.  Diagnoses and all orders for this visit:  Venous ulcer ([place])  Venous insufficiency  Varicose veins of both lower extremities, unspecified whether complicated  BMI 99991111, adult ([place])  Essential hypertension  Hyperlipidemia, unspecified hyperlipidemia type  Impaired fasting blood sugar  Elevated PSA  Other orders -     HYDROcodone-acetaminophen (NORCO) 5-325 MG tablet; Take 1 tablet by mouth every 12 (twelve) hours as needed. -     Wound Dressings (UNNA-FLEX ELASTIC UNNA BOOT) MISC; Apply 2 each topically once a week. -     cephALEXin (KEFLEX) 500 MG capsule; Take 1 capsule (500 mg total) by mouth 3 (three) times daily.    Follow-up with vascular surgery as planned, urology as planned

## 2021-05-07 NOTE — Telephone Encounter (Signed)
Make sure he is aware that I sent UNNA boot dressing, Keflex antibiotic and pain medicine to the pharmacy.  Should be 3 prescriptions.  I would also recommend a medicine to help stabilize blood sugars, help reduce risk to diabetes and help with weight loss.  I believe we have off of this before.  I want to make sure he knows I still recommend he do one of the following medications-either metformin to help stabilize sugars which would be an oral tablet or something like Ozempic injection weekly to help with sugar control and help with weight loss.  See if agreeable to trial

## 2021-05-08 ENCOUNTER — Other Ambulatory Visit: Payer: Self-pay | Admitting: Medical

## 2021-05-08 MED ORDER — METFORMIN HCL 500 MG PO TABS: 500.0000 mg | ORAL_TABLET | Freq: Every day | ORAL | 2 refills | Status: DC

## 2021-05-11 ENCOUNTER — Other Ambulatory Visit: Payer: Self-pay

## 2021-05-11 DIAGNOSIS — M7989 Other specified soft tissue disorders: Secondary | ICD-10-CM

## 2021-05-22 ENCOUNTER — Other Ambulatory Visit: Payer: Self-pay | Admitting: Medical

## 2021-05-27 ENCOUNTER — Other Ambulatory Visit: Payer: Self-pay

## 2021-05-27 ENCOUNTER — Ambulatory Visit (INDEPENDENT_AMBULATORY_CARE_PROVIDER_SITE_OTHER): Payer: PRIVATE HEALTH INSURANCE | Admitting: Physician Assistant

## 2021-05-27 ENCOUNTER — Ambulatory Visit (HOSPITAL_COMMUNITY)
Admission: RE | Admit: 2021-05-27 | Discharge: 2021-05-27 | Disposition: A | Discharge status: Home / Self Care | Payer: PRIVATE HEALTH INSURANCE | Source: Ambulatory Visit | Attending: Vascular Surgery | Admitting: Vascular Surgery

## 2021-05-27 VITALS — BP 150/96 | HR 85 | Temp 97.1°F | Ht 75.0 in | Wt >= 6400 oz

## 2021-05-27 DIAGNOSIS — L97909 Non-pressure chronic ulcer of unspecified part of unspecified lower leg with unspecified severity: Secondary | ICD-10-CM | POA: Diagnosis not present

## 2021-05-27 DIAGNOSIS — M7989 Other specified soft tissue disorders: Secondary | ICD-10-CM

## 2021-05-27 DIAGNOSIS — I83009 Varicose veins of unspecified lower extremity with ulcer of unspecified site: Secondary | ICD-10-CM

## 2021-05-27 DIAGNOSIS — I872 Venous insufficiency (chronic) (peripheral): Secondary | ICD-10-CM | POA: Diagnosis not present

## 2021-05-27 NOTE — Progress Notes (Signed)
Office Note     CC:  follow up Requesting Provider:  Jac Canavan, PA-C  HPI: Ryan WHITENACK [name] is a 66 y.o. (1956/03/26) male who presents for evaluation of left lateral lower leg ulceration.  He states wound has been present for the past month.  He has been treated for cellulitis and is currently taking Keflex.  He does not elevate his legs or make an effort to prop his feet up during the day.  He denies history of DVT, venous ulceration, trauma, or prior vascular interventions of bilateral lower extremities.  Patient states his current weight is stable.  He has no interest in bariatric surgery or consultation.  He denies tobacco use.   Past Medical History:  Diagnosis Date   Family history of colon cancer    father -questionable   Hypertension    previous HTN with 400lb weight   Obesity    has been up to 400lb   Pneumonia    Wears glasses     Past Surgical History:  Procedure Laterality Date   unremarkable      Social History   Socioeconomic History   Marital status: Married    Spouse name: Not on file   Number of children: Not on file   Years of education: Not on file   Highest education level: Not on file  Occupational History   Not on file  Tobacco Use   Smoking status: Never   Smokeless tobacco: Never  Substance and Sexual Activity   Alcohol use: Yes    Comment: occasional only   Drug use: No   Sexual activity: Not on file  Other Topics Concern   Not on file  Social History Narrative   Married, son in college, has 5 adopted children, some special needs ,some planning to adopt, Exercise with walking most mornings, walks 2 miles at a time typically. Works at [employer].   Social Determinants of Health   Financial Resource Strain: Not on file  Food Insecurity: Not on file  Transportation Needs: Not on file  Physical Activity: Not on file  Stress: Not on file  Social Connections: Not on file  Intimate Partner Violence: Not on  file    Family History  Problem Relation Age of Onset   Cancer Mother 36       metastases-unaware as to type of ca   Obesity Mother    Colon cancer Father 79       questionable   Liver disease Father    Alcohol abuse Father    Heart disease Neg Hx    Hypertension Neg Hx    Hyperlipidemia Neg Hx    Stroke Neg Hx    Diabetes Neg Hx    Esophageal cancer Neg Hx    Rectal cancer Neg Hx     Current Outpatient Medications  Medication Sig Dispense Refill   aspirin 81 MG EC tablet Take 1 tablet (81 mg total) by mouth daily. Swallow whole. 90 tablet 3   HYDROcodone-acetaminophen (NORCO) 5-325 MG tablet Take 1 tablet by mouth every 12 (twelve) hours as needed. 20 tablet 0   metFORMIN (GLUCOPHAGE) 500 MG tablet Take 1 tablet (500 mg total) by mouth daily with breakfast. 30 tablet 2   rosuvastatin (CRESTOR) 10 MG tablet TAKE 1 TABLET BY MOUTH EVERY DAY 90 tablet 0   valsartan-hydrochlorothiazide (DIOVAN-HCT) 320-12.5 MG tablet Take 1 tablet by mouth daily. 90 tablet 3   cephALEXin (KEFLEX) 500 MG capsule Take 1  capsule (500 mg total) by mouth 3 (three) times daily. (Patient not taking: Reported on 05/27/2021) 30 capsule 0   Wound Dressings (UNNA-FLEX ELASTIC UNNA BOOT) MISC Apply 2 each topically once a week. (Patient not taking: Reported on 05/27/2021) 2 each 1   No current facility-administered medications for this visit.    No Known Allergies   REVIEW OF SYSTEMS:   [X]  denotes positive finding, [ ]  denotes negative finding Cardiac  Comments:  Chest pain or chest pressure:    Shortness of breath upon exertion:    Short of breath when lying flat:    Irregular heart rhythm:        Vascular    Pain in calf, thigh, or hip brought on by ambulation:    Pain in feet at night that wakes you up from your sleep:     Blood clot in your veins:    Leg swelling:         Pulmonary    Oxygen at home:    Productive cough:     Wheezing:         Neurologic    Sudden weakness in arms or  legs:     Sudden numbness in arms or legs:     Sudden onset of difficulty speaking or slurred speech:    Temporary loss of vision in one eye:     Problems with dizziness:         Gastrointestinal    Blood in stool:     Vomited blood:         Genitourinary    Burning when urinating:     Blood in urine:        Psychiatric    Major depression:         Hematologic    Bleeding problems:    Problems with blood clotting too easily:        Skin    Rashes or ulcers:        Constitutional    Fever or chills:      PHYSICAL EXAMINATION:  Vitals:   05/27/21 1142  BP: (!) 150/96  Pulse: 85  Temp: (!) 97.1 F (36.2 C)  Weight: (!) 422 lb 14.4 oz (191.8 kg)  Height: 6\' 3"  (1.905 m)    General:  WDWN in NAD; vital signs documented above Gait: Not observed HENT: WNL, normocephalic Pulmonary: normal non-labored breathing , without Rales, rhonchi,  wheezing Cardiac: regular HR Abdomen: soft, NT, no masses Skin: without rashes Vascular Exam/Pulses:  Right Left  Radial 2+ (normal) 2+ (normal)  DP Multiphasic by Doppler Multiphasic by Doppler  PT Brisk by Doppler Brisk by Doppler   Extremities: No wounds or tissue changes of feet and toes; left lateral lower leg wound with surrounding erythema; thick eschar Musculoskeletal: no muscle wasting or atrophy  Neurologic: A&O X 3;  No focal weakness or paresthesias are detected Psychiatric:  The pt has Normal affect.   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 male here for evaluation of venous ulceration of left lateral lower leg  -Patient currently being treated for cellulitis surrounding left lateral lower leg venous ulceration.  Continue Keflex to completion -Left lower extremity venous reflux study was negative for DVT and deep venous  reflux.  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  A urgent referral was made to the [facility] wound clinic.  Wound will likely need to be debrided and will likely benefit from weekly Unna boot changes for several weeks to months.  I provide the patient with Ace wraps today and instructed on how to incorporate the foot when wrapping up to the level of the knee.  He should wear these compression wraps every day.  He should also work on elevating his leg periodically throughout the day above the level of his heart.  We also discussed avoiding prolonged sitting and standing.  Nothing further to add from a vascular surgery standpoint.  He will follow-up on an as-needed basis   Emilie Rutter, PA-C [facility] [phone]  Clinic MD:   Randie Heinz

## 2021-06-11 ENCOUNTER — Other Ambulatory Visit: Payer: Self-pay

## 2021-06-11 ENCOUNTER — Encounter (HOSPITAL_BASED_OUTPATIENT_CLINIC_OR_DEPARTMENT_OTHER): Payer: No Typology Code available for payment source | Attending: Internal Medicine | Admitting: Internal Medicine

## 2021-06-11 DIAGNOSIS — I890 Lymphedema, not elsewhere classified: Secondary | ICD-10-CM | POA: Diagnosis not present

## 2021-06-11 DIAGNOSIS — Z6841 Body Mass Index (BMI) 40.0 and over, adult: Secondary | ICD-10-CM | POA: Diagnosis not present

## 2021-06-11 DIAGNOSIS — I10 Essential (primary) hypertension: Secondary | ICD-10-CM | POA: Insufficient documentation

## 2021-06-11 DIAGNOSIS — I87312 Chronic venous hypertension (idiopathic) with ulcer of left lower extremity: Secondary | ICD-10-CM | POA: Insufficient documentation

## 2021-06-11 DIAGNOSIS — G8929 Other chronic pain: Secondary | ICD-10-CM | POA: Insufficient documentation

## 2021-06-11 DIAGNOSIS — L97822 Non-pressure chronic ulcer of other part of left lower leg with fat layer exposed: Secondary | ICD-10-CM | POA: Diagnosis present

## 2021-06-11 NOTE — Progress Notes (Signed)
[name], [name] ([id number]) Visit Report for 06/11/2021 Abuse Risk Screen Details Patient Name: Date of Service: [name] 06/11/2021 2:15 PM Medical Record Number: [id number] Patient Account Number: [id number] Date of Birth/Sex: Treating RN: 1955/11/03 (65 y.o. Male) Redmond Pulling Primary Care Dimetrius Montfort: [name] Other Clinician: Referring Khaliq Turay: Treating Daven Pinckney/Extender: Randalyn Rhea in Treatment: 0 Abuse Risk Screen Items Answer ABUSE RISK SCREEN: Has anyone close to you tried to hurt or harm you recentlyo No Do you feel uncomfortable with anyone in your familyo No Has anyone forced you do things that you didnt want to doo No Electronic Signature(s) Signed: 06/11/2021 4:48:24 PM By: Redmond Pulling RN, [name] Entered By: Redmond Pulling on 06/11/2021 14:38:06 -------------------------------------------------------------------------------- Activities of Daily Living Details Patient Name: Date of Service: [name] 06/11/2021 2:15 PM Medical Record Number: [id number] Patient Account Number: [id number] Date of Birth/Sex: Treating RN: Aug 17, 1955 (65 y.o. Male) Redmond Pulling Primary Care Makayle Krahn: [name] Other Clinician: Referring Shyonna Carlin: Treating Kristelle Cavallaro/Extender: Randalyn Rhea in Treatment: 0 Activities of Daily Living Items Answer Activities of Daily Living (Please select one for each item) Drive Automobile Completely Able T Medications ake Completely Able Use T elephone Completely Able Care for Appearance Completely Able Use T oilet Completely Able Bath / Shower Completely Able Dress Self Completely Able Feed Self Completely Able Walk Completely Able Get In / Out Bed Completely Able Housework Completely Able Prepare Meals Completely Able Handle Money Completely Able Shop for Self Completely Able Electronic Signature(s) Signed: 06/11/2021 4:48:24 PM By: Redmond Pulling RN, [name] Entered By:  Redmond Pulling on 06/11/2021 14:38:42 -------------------------------------------------------------------------------- Education Screening Details Patient Name: Date of Service: [name], [name] 06/11/2021 2:15 PM Medical Record Number: [id number] Patient Account Number: [id number] Date of Birth/Sex: Treating RN: May 03, 1956 (65 y.o. Male) Redmond Pulling Primary Care Aubra Pappalardo: [name] Other Clinician: Referring Jarell Mcewen: Treating Shakoya Gilmore/Extender: Randalyn Rhea in Treatment: 0 Primary Learner Assessed: Patient Learning Preferences/Education Level/Primary Language Learning Preference: Explanation, Demonstration, Printed Material Highest Education Level: High School Preferred Language: English Cognitive Barrier Language Barrier: No Translator Needed: No Memory Deficit: No Emotional Barrier: No Cultural/Religious Beliefs Affecting Medical Care: No Physical Barrier Impaired Vision: No Impaired Hearing: No Decreased Hand dexterity: No Knowledge/Comprehension Knowledge Level: High Comprehension Level: High Ability to understand written instructions: High Ability to understand verbal instructions: High Motivation Anxiety Level: Calm Cooperation: Cooperative Education Importance: Acknowledges Need Interest in Health Problems: Asks Questions Perception: Coherent Willingness to Engage in Self-Management High Activities: Readiness to Engage in Self-Management High Activities: Electronic Signature(s) Signed: 06/11/2021 4:48:24 PM By: Redmond Pulling RN, [name] Entered By: Redmond Pulling on 06/11/2021 14:39:46 -------------------------------------------------------------------------------- Fall Risk Assessment Details Patient Name: Date of Service: [name], [name] 06/11/2021 2:15 PM Medical Record Number: [id number] Patient Account Number: [id number] Date of Birth/Sex: Treating RN: Jun 16, 1955 (65 y.o. Male) Redmond Pulling Primary Care Wahid Holley:  [name] Other Clinician: Referring Zyanne Schumm: Treating Jasmane Brockway/Extender: Randalyn Rhea in Treatment: 0 Fall Risk Assessment Items Have you had 2 or more falls in the last 12 monthso 0 No Have you had any fall that resulted in injury in the last 12 monthso 0 No FALLS RISK SCREEN History of falling - immediate or within 3 months 0 No Secondary diagnosis (Do you have 2 or more medical diagnoseso) 0 No Ambulatory aid None/bed rest/wheelchair/nurse 0 No Crutches/cane/walker 0 No Furniture 0 No Intravenous therapy Access/Saline/Heparin Lock 0 No Gait/Transferring Normal/ bed rest/ wheelchair 0 No  Weak (short steps with or without shuffle, stooped but able to lift head while walking, may seek 0 No support from furniture) Impaired (short steps with shuffle, may have difficulty arising from chair, head down, impaired 0 No balance) Mental Status Oriented to own ability 0 No Electronic Signature(s) Signed: 06/11/2021 4:48:24 PM By: Redmond Pulling RN, [name] Entered By: Redmond Pulling on 06/11/2021 14:40:03 -------------------------------------------------------------------------------- Foot Assessment Details Patient Name: Date of Service: [name], [name] 06/11/2021 2:15 PM Medical Record Number: [id number] Patient Account Number: [id number] Date of Birth/Sex: Treating RN: [birth year] (65 y.o. Male) Redmond Pulling Primary Care Riven Beebe: [name] Other Clinician: Referring Yoshiko Keleher: Treating Xaivier Malay/Extender: Randalyn Rhea in Treatment: 0 Foot Assessment Items Site Locations + = Sensation present, - = Sensation absent, C = Callus, U = Ulcer R = Redness, W = Warmth, M = Maceration, PU = Pre-ulcerative lesion F = Fissure, S = Swelling, D = Dryness Assessment Right: Left: Other Deformity: No No Prior Foot Ulcer: No No Prior Amputation: No No Charcot Joint: No No Ambulatory Status: Gait: Electronic  Signature(s) Signed: 06/11/2021 4:48:24 PM By: Redmond Pulling RN, [name] Entered By: Redmond Pulling on 06/11/2021 14:46:17 -------------------------------------------------------------------------------- Nutrition Risk Screening Details Patient Name: Date of Service: [name] 06/11/2021 2:15 PM Medical Record Number: [id number] Patient Account Number: [id number] Date of Birth/Sex: Treating RN: Aug 01, 1955 (65 y.o. Male) Redmond Pulling Primary Care Chayne Baumgart: [name] Other Clinician: Referring Loretto Belinsky: Treating Saphyre Cillo/Extender: Randalyn Rhea in Treatment: 0 Height (in): 75 Weight (lbs): 400 Body Mass Index (BMI): 50 Nutrition Risk Screening Items Score Screening NUTRITION RISK SCREEN: I have an illness or condition that made me change the kind and/or amount of food I eat 0 No I eat fewer than two meals per day 0 No I eat few fruits and vegetables, or milk products 0 No I have three or more drinks of beer, liquor or wine almost every day 0 No I have tooth or mouth problems that make it hard for me to eat 0 No I don't always have enough money to buy the food I need 0 No I eat alone most of the time 0 No I take three or more different prescribed or over-the-counter drugs a day 0 No Without wanting to, I have lost or gained 10 pounds in the last six months 0 No I am not always physically able to shop, cook and/or feed myself 0 No Nutrition Protocols Good Risk Protocol Moderate Risk Protocol High Risk Proctocol Risk Level: Good Risk Score: 0 Electronic Signature(s) Signed: 06/11/2021 4:48:24 PM By: Redmond Pulling RN, [name] Entered By: Redmond Pulling on 06/11/2021 14:40:31

## 2021-06-11 NOTE — Progress Notes (Addendum)
[name], [name] ([id number]) Visit Report for 06/11/2021 Allergy List Details Patient Name: Date of Service: [name] 06/11/2021 2:15 PM Medical Record Number: [id number] Patient Account Number: [id number] Date of Birth/Sex: Treating RN: [birth year] (66 y.o. Male) [name] Primary Care [name]: [name] Other Clinician: Referring [name]: Treating [name]/Extender: [name] in Treatment: 0 Allergies Active Allergies No Known Allergies Allergy Notes Electronic Signature(s) Signed: 06/11/2021 4:48:24 PM By: Ryan Creamer RN, [name] Previous Signature: 06/11/2021 8:04:36 AM Version By: Ryan Creamer RN, [name] Entered By: [name] on 06/11/2021 14:34:25 -------------------------------------------------------------------------------- Arrival Information Details Patient Name: Date of Service: [name], [name] 06/11/2021 2:15 PM Medical Record Number: [id number] Patient Account Number: [id number] Date of Birth/Sex: Treating RN: 31-Jul-1955 (66 y.o. Male) [name] Primary Care [name]: [name] Other Clinician: Referring [name]: Treating [name]/Extender: [name] in Treatment: 0 Visit Information Patient Arrived: Ambulatory Arrival Time: 14:30 Transfer Assistance: None Patient Identification Verified: Yes Secondary Verification Process Completed: Yes Patient Requires Transmission-Based Precautions: No Patient Has Alerts: Yes Patient Alerts: Patient on Blood Thinner Electronic Signature(s) Signed: 06/11/2021 4:48:24 PM By: Ryan Creamer RN, [name] Entered By: [name] on 06/11/2021 14:31:26 -------------------------------------------------------------------------------- Clinic Level of Care Assessment Details Patient Name: Date of Service: [name], [name] 06/11/2021 2:15 PM Medical Record Number: [id number] Patient Account Number: [id number] Date of Birth/Sex: Treating RN: [birth year]  (66 y.o. Male) [name] Primary Care [name]: [name] Other Clinician: Referring [name]: Treating [name]/Extender: [name] in Treatment: 0 Clinic Level of Care Assessment Items TOOL 1 Quantity Score X- 1 0 Use when EandM and Procedure is performed on INITIAL visit ASSESSMENTS - Nursing Assessment / Reassessment X- 1 20 General Physical Exam (combine w/ comprehensive assessment (listed just below) when performed on new pt. evals) X- 1 25 Comprehensive Assessment (HX, ROS, Risk Assessments, Wounds Hx, etc.) ASSESSMENTS - Wound and Skin Assessment / Reassessment X- 1 10 Dermatologic / Skin Assessment (not related to wound area) ASSESSMENTS - Ostomy and/or Continence Assessment and Care []  - 0 Incontinence Assessment and Management []  - 0 Ostomy Care Assessment and Management (repouching, etc.) PROCESS - Coordination of Care []  - 0 Simple Patient / Family Education for ongoing care X- 1 20 Complex (extensive) Patient / Family Education for ongoing care X- 1 10 Staff obtains Programmer, systems, Records, T Results / Process Orders est []  - 0 Staff telephones HHA, Nursing Homes / Clarify orders / etc []  - 0 Routine Transfer to another Facility (non-emergent condition) []  - 0 Routine Hospital Admission (non-emergent condition) X- 1 15 New Admissions / Biomedical engineer / Ordering NPWT Apligraf, etc. , []  - 0 Emergency Hospital Admission (emergent condition) PROCESS - Special Needs []  - 0 Pediatric / Minor Patient Management []  - 0 Isolation Patient Management []  - 0 Hearing / Language / Visual special needs []  - 0 Assessment of Community assistance (transportation, D/C planning, etc.) []  - 0 Additional assistance / Altered mentation []  - 0 Support Surface(s) Assessment (bed, cushion, seat, etc.) INTERVENTIONS - Miscellaneous []  - 0 External ear exam []  - 0 Patient Transfer (multiple staff / Civil Service fast streamer / Similar  devices) []  - 0 Simple Staple / Suture removal (25 or less) []  - 0 Complex Staple / Suture removal (26 or more) []  - 0 Hypo/Hyperglycemic Management (do not check if billed separately) []  - 0 Ankle / Brachial Index (ABI) - do not check if billed separately Has the patient been seen at the  hospital within the last three years: Yes Total Score: 100 Level Of Care: New/Established - Level 3 Electronic Signature(s) Signed: 06/11/2021 5:47:56 PM By: Ryan Pilling RN, [name] Entered By: [name] on 06/11/2021 15:21:58 -------------------------------------------------------------------------------- Encounter Discharge Information Details Patient Name: Date of Service: Ryan [name], [name] 06/11/2021 2:15 PM Medical Record Number: [id number] Patient Account Number: [id number] Date of Birth/Sex: Treating RN: 02-05-1956 (66 y.o. Male) [name] Primary Care [name]: [name] Other Clinician: Referring [name]: Treating [name]/Extender: [name] in Treatment: 0 Encounter Discharge Information Items Post Procedure Vitals Discharge Condition: Stable Temperature (F): 99 Ambulatory Status: Ambulatory Pulse (bpm): 106 Discharge Destination: Home Respiratory Rate (breaths/min): 20 Transportation: Private Auto Blood Pressure (mmHg): 136/76 Accompanied By: self Schedule Follow-up Appointment: Yes Clinical Summary of Care: Electronic Signature(s) Signed: 06/11/2021 5:47:56 PM By: Ryan Pilling RN, [name] Entered By: [name] on 06/11/2021 15:33:50 -------------------------------------------------------------------------------- Lower Extremity Assessment Details Patient Name: Date of Service: Ryan SS, [name] 06/11/2021 2:15 PM Medical Record Number: [id number] Patient Account Number: [id number] Date of Birth/Sex: Treating RN: May 07, 1955 (66 y.o. Male) [name] Primary Care [name]: [name] Other Clinician: Referring  [name]: Treating [name]/Extender: [name] in Treatment: 0 Edema Assessment Assessed: [name]: Yes] [Right: No] Edema: [Left: Ye] [Right: s] Calf Left: Right: Point of Measurement: 41 cm From Medial Instep 58.9 cm Ankle Left: Right: Point of Measurement: 15 cm From Medial Instep 31.5 cm Knee To Floor Left: Right: From Medial Instep 48 cm Vascular Assessment Pulses: Dorsalis Pedis Palpable: [Left:Yes] Doppler Audible: [Left:Yes] Posterior Tibial Palpable: [Left:Yes] Doppler Audible: [Left:Yes] Notes unable to obtain ABIs too painful. Electronic Signature(s) Signed: 06/11/2021 4:48:24 PM By: Ryan Creamer RN, [name] Signed: 06/11/2021 5:47:56 PM By: Ryan Pilling RN, [name] Entered By: [name] on 06/11/2021 15:10:56 -------------------------------------------------------------------------------- Multi Wound Chart Details Patient Name: Date of Service: Ryan [name], [name] 06/11/2021 2:15 PM Medical Record Number: [id number] Patient Account Number: [id number] Date of Birth/Sex: Treating RN: May 13, 1955 (65 y.o. Male) Levan Hurst Primary Care Alcus Bradly: [name] Other Clinician: Referring Deontra Pereyra: Treating Rosa Gambale/Extender: [name] in Treatment: 0 Vital Signs Height(in): 75 Pulse(bpm): 106 Weight(lbs): 400 Blood Pressure(mmHg): 136/76 Body Mass Index(BMI): 50 Temperature(F): 99.0 Respiratory Rate(breaths/min): 20 Photos: [N/A:N/A] Left, Lateral Lower Leg N/A N/A Wound Location: Gradually Appeared N/A N/A Wounding Event: Venous Leg Ulcer N/A N/A Primary Etiology: Lymphedema N/A N/A Secondary Etiology: Hypertension N/A N/A Comorbid History: 02/14/2021 N/A N/A Date Acquired: 0 N/A N/A Weeks of Treatment: Open N/A N/A Wound Status: No N/A N/A Wound Recurrence: Yes N/A N/A Clustered Wound: 8x7x0.2 N/A N/A Measurements L x W x D (cm) 43.982 N/A N/A A (cm) : rea 8.796 N/A N/A Volume  (cm) : 0.00% N/A N/A % Reduction in A rea: 0.00% N/A N/A % Reduction in Volume: Full Thickness Without Exposed N/A N/A Classification: Support Structures Medium N/A N/A Exudate A mount: Serosanguineous N/A N/A Exudate Type: red, brown N/A N/A Exudate Color: Flat and Intact N/A N/A Wound Margin: None Present (0%) N/A N/A Granulation A mount: Large (67-100%) N/A N/A Necrotic A mount: Fat Layer (Subcutaneous Tissue): Yes N/A N/A Exposed Structures: Fascia: No Tendon: No Muscle: No Joint: No Bone: No None N/A N/A Epithelialization: Debridement - Excisional N/A N/A Debridement: Pre-procedure Verification/Time Out 15:15 N/A N/A Taken: Other N/A N/A Pain Control: Subcutaneous, Slough N/A N/A Tissue Debrided: Skin/Subcutaneous Tissue N/A N/A Level: 56 N/A N/A Debridement A (sq cm): rea Curette N/A N/A Instrument: Minimum N/A N/A Bleeding: Pressure  N/A N/A Hemostasis Achieved: 0 N/A N/A Procedural Pain: 3 N/A N/A Post Procedural Pain: Procedure was tolerated well N/A N/A Debridement Treatment Response: 8x7x0.2 N/A N/A Post Debridement Measurements L x W x D (cm) 8.796 N/A N/A Post Debridement Volume: (cm) Debridement N/A N/A Procedures Performed: Treatment Notes Electronic Signature(s) Signed: 06/11/2021 3:50:41 PM By: Kalman Shan DO Signed: 06/11/2021 6:05:40 PM By: Levan Hurst RN, [name] Entered By: Kalman Shan on 06/11/2021 15:31:38 -------------------------------------------------------------------------------- Multi-Disciplinary Care Plan Details Patient Name: Date of Service: Ryan [name], [name] 06/11/2021 2:15 PM Medical Record Number: [id number] Patient Account Number: [id number] Date of Birth/Sex: Treating RN: 1955/11/07 (65 y.o. Male) [name] Primary Care Jaretzi Droz: [name] Other Clinician: Referring Ramonte Mena: Treating Lonald Troiani/Extender: [name] in Treatment: 0 Active Inactive Necrotic  Tissue Nursing Diagnoses: Knowledge deficit related to management of necrotic/devitalized tissue Goals: Necrotic/devitalized tissue will be minimized in the wound bed Date Initiated: 06/11/2021 Target Resolution Date: 07/10/2021 Goal Status: Active Interventions: Assess patient pain level pre-, during and post procedure and prior to discharge Provide education on necrotic tissue and debridement process Treatment Activities: Apply topical anesthetic as ordered : 06/11/2021 Notes: Nutrition Nursing Diagnoses: Potential for alteratiion in Nutrition/Potential for imbalanced nutrition Goals: Patient/caregiver agrees to and verbalizes understanding of need to obtain nutritional consultation Date Initiated: 06/11/2021 Target Resolution Date: 07/10/2021 Goal Status: Active Interventions: Assess patient nutrition upon admission and as needed per policy Provide education on nutrition Treatment Activities: Giving encouragement to exercise : 06/11/2021 Special diet education : 06/11/2021 Notes: Orientation to the Wound Care Program Nursing Diagnoses: Knowledge deficit related to the wound healing center program Goals: Patient/caregiver will verbalize understanding of the [place] Date Initiated: 06/11/2021 Target Resolution Date: 07/09/2021 Goal Status: Active Interventions: Provide education on orientation to the wound center Notes: Venous Leg Ulcer Nursing Diagnoses: Potential for venous Insuffiency (use before diagnosis confirmed) Goals: Patient will maintain optimal edema control Date Initiated: 06/11/2021 Target Resolution Date: 07/10/2021 Goal Status: Active Patient/caregiver will verbalize understanding of disease process and disease management Date Initiated: 06/11/2021 Target Resolution Date: 07/10/2021 Goal Status: Active Interventions: Assess peripheral edema status every visit. Compression as ordered Provide education on venous insufficiency Notes: Wound/Skin  Impairment Nursing Diagnoses: Knowledge deficit related to ulceration/compromised skin integrity Goals: Patient/caregiver will verbalize understanding of skin care regimen Date Initiated: 06/11/2021 Target Resolution Date: 07/09/2021 Goal Status: Active Interventions: Assess patient/caregiver ability to obtain necessary supplies Assess patient/caregiver ability to perform ulcer/skin care regimen upon admission and as needed Provide education on ulcer and skin care Treatment Activities: Skin care regimen initiated : 06/11/2021 Topical wound management initiated : 06/11/2021 Notes: Electronic Signature(s) Signed: 06/11/2021 5:47:56 PM By: Ryan Pilling RN, [name] Entered By: [name] on 06/11/2021 15:20:23 -------------------------------------------------------------------------------- Pain Assessment Details Patient Name: Date of Service: [name] 06/11/2021 2:15 PM Medical Record Number: [id number] Patient Account Number: [id number] Date of Birth/Sex: Treating RN: Feb 24, 1956 (65 y.o. Male) [name] Primary Care Tavari Loadholt: [name] Other Clinician: Referring Charmin Aguiniga: Treating Rolonda Pontarelli/Extender: [name] in Treatment: 0 Active Problems Location of Pain Severity and Description of Pain Patient Has Paino Yes Site Locations Pain Location: Pain in Ulcers Duration of the Pain. Constant / Intermittento Constant Rate the pain. Current Pain Level: 10 Worst Pain Level: 10 Least Pain Level: 8 Tolerable Pain Level: 5 Character of Pain Describe the Pain: Aching, Burning Pain Management and Medication Current Pain Management: Medication: Yes Leg drop or elevation: Yes Rest: Yes How does your wound impact  your activities of daily livingo Sleep: Yes Electronic Signature(s) Signed: 06/11/2021 4:48:24 PM By: Redmond Pulling RN, [name] Entered By: Redmond Pulling on 06/11/2021  14:56:25 -------------------------------------------------------------------------------- Patient/Caregiver Education Details Patient Name: Date of Service: [name] 2/9/2023andnbsp2:15 PM Medical Record Number: [id number] Patient Account Number: [id number] Date of Birth/Gender: Treating RN: October 08, 1955 (65 y.o. Male) Shawn Stall Primary Care Physician: [name] Other Clinician: Referring Physician: Treating Physician/Extender: Randalyn Rhea in Treatment: 0 Education Assessment Education Provided To: Patient Education Topics Provided Welcome [facility]: o Handouts: Welcome [facility] o Methods: Explain/Verbal, Printed Responses: Reinforcements needed Wound Debridement: Handouts: Wound Debridement Methods: Explain/Verbal, Printed Responses: Reinforcements needed Electronic Signature(s) Signed: 06/11/2021 5:47:56 PM By: [name], [name] Entered By: Shawn Stall on 06/11/2021 15:20:48 -------------------------------------------------------------------------------- Wound Assessment Details Patient Name: Date of Service: [name], [name] 06/11/2021 2:15 PM Medical Record Number: [id number] Patient Account Number: [id number] Date of Birth/Sex: Treating RN: [birth year] (65 y.o. Male) Redmond Pulling Primary Care Arend Bahl: [name] Other Clinician: Referring Selvin Yun: Treating Ciin Brazzel/Extender: Randalyn Rhea in Treatment: 0 Wound Status Wound Number: 1 Primary Etiology: Venous Leg Ulcer Wound Location: Left, Lateral Lower Leg Secondary Etiology: Lymphedema Wounding Event: Gradually Appeared Wound Status: Open Date Acquired: 02/14/2021 Comorbid History: Hypertension Weeks Of Treatment: 0 Clustered Wound: Yes Photos Wound Measurements Length: (cm) 8 Width: (cm) 7 Depth: (cm) 0.2 Area: (cm) 43.982 Volume: (cm) 8.796 % Reduction in Area: 0% % Reduction in  Volume: 0% Epithelialization: None Tunneling: No Undermining: No Wound Description Classification: Full Thickness Without Exposed Support Structu Wound Margin: Flat and Intact Exudate Amount: Medium Exudate Type: Serosanguineous Exudate Color: red, brown res Foul Odor After Cleansing: No Slough/Fibrino Yes Wound Bed Granulation Amount: None Present (0%) Exposed Structure Necrotic Amount: Large (67-100%) Fascia Exposed: No Necrotic Quality: Adherent Slough Fat Layer (Subcutaneous Tissue) Exposed: Yes Tendon Exposed: No Muscle Exposed: No Joint Exposed: No Bone Exposed: No Treatment Notes Wound #1 (Lower Leg) Wound Laterality: Left, Lateral Cleanser Soap and Water Discharge Instruction: May shower and wash wound with dial antibacterial soap and water prior to dressing change. Wound Cleanser Discharge Instruction: Cleanse the wound with wound cleanser prior to applying a clean dressing using gauze sponges, not tissue or cotton balls. Peri-Wound Care Triamcinolone 15 (g) Discharge Instruction: Use triamcinolone 15 (g) as directed Sween Lotion (Moisturizing lotion) Discharge Instruction: Apply moisturizing lotion as directed Topical Gentamicin Discharge Instruction: As directed by physician Primary Dressing Hydrofera Blue Classic Foam, 4x4 in Discharge Instruction: Moisten with saline prior to apply over gentamicin ointment. Secondary Dressing ABD Pad, 8x10 Discharge Instruction: Apply over primary dressing as directed. Zetuvit Plus 4x8 in Discharge Instruction: Apply over primary dressing as directed. CarboFLEX Odor Control Dressing, 4x4 in Discharge Instruction: Apply over primary dressing as directed. Secured With Compression Wrap Kerlix Roll 4.5x3.1 (in/yd) Discharge Instruction: Apply Kerlix and Coban compression as directed. Coban Self-Adherent Wrap 4x5 (in/yd) Discharge Instruction: Apply over Kerlix as directed. Compression Stockings Add-Ons Electronic  Signature(s) Signed: 06/11/2021 4:48:24 PM By: Redmond Pulling RN, [name] Signed: 06/11/2021 5:47:56 PM By: [name], [name] Entered By: Shawn Stall on 06/11/2021 15:18:44 -------------------------------------------------------------------------------- Vitals Details Patient Name: Date of Service: [name], [name] 06/11/2021 2:15 PM Medical Record Number: [id number] Patient Account Number: [id number] Date of Birth/Sex: Treating RN: 10/13/1955 (65 y.o. Male) Redmond Pulling Primary Care Arionne Iams: [name] Other Clinician: Referring Aanshi Batchelder: Treating Tameka Hoiland/Extender: Randalyn Rhea in Treatment: 0 Vital Signs Time  Taken: 14:33 Temperature (F): 99.0 Height (in): 75 Pulse (bpm): 106 Weight (lbs): 400 Respiratory Rate (breaths/min): 20 Body Mass Index (BMI): 50 Blood Pressure (mmHg): 136/76 Reference Range: 80 - 120 mg / dl Electronic Signature(s) Signed: 06/11/2021 4:48:24 PM By: Ryan Creamer RN, [name] Entered By: [name] on 06/11/2021 14:34:05

## 2021-06-11 NOTE — Progress Notes (Signed)
ESAI, THAUT ([id number]) Visit Report for 06/11/2021 Chief Complaint Document Details Patient Name: Date of Service: [name] 06/11/2021 2:15 PM Medical Record Number: [id number] Patient Account Number: [id number] Date of Birth/Sex: Treating RN: [birth year] (65 y.o. Male) [name] Primary Care Provider: Chana Porter Other Clinician: Referring Provider: Treating Provider/Extender: [name] in Treatment: 0 Information Obtained from: Patient Chief Complaint Left lower extremity wounds Electronic Signature(s) Signed: 06/11/2021 3:50:41 PM By: [name] Entered By: [name] on 06/11/2021 15:31:50 -------------------------------------------------------------------------------- Debridement Details Patient Name: Date of Service: [name], [name] 06/11/2021 2:15 PM Medical Record Number: [id number] Patient Account Number: [id number] Date of Birth/Sex: Treating RN: 02/20/1956 (65 y.o. Male) [name] Primary Care Provider: Chana Porter Other Clinician: Referring Provider: Treating Provider/Extender: [name] in Treatment: 0 Debridement Performed for Assessment: Wound #1 Left,Lateral Lower Leg Performed By: Physician Ryan Shan, Porter Debridement Type: Debridement Severity of Tissue Pre Debridement: Fat layer exposed Level of Consciousness (Pre-procedure): Awake and Alert Pre-procedure Verification/Time Out Yes - 15:15 Taken: Start Time: 15:16 Pain Control: Other : benzocaine 20% T Area Debrided (L x W): otal 8 (cm) x 7 (cm) = 56 (cm) Tissue and other material debrided: Viable, Non-Viable, Slough, Subcutaneous, Skin: Dermis , Skin: Epidermis, Fibrin/Exudate, Slough Level: Skin/Subcutaneous Tissue Debridement Description: Excisional Instrument: Curette Bleeding: Minimum Hemostasis Achieved: Pressure End Time: 15:23 Procedural Pain: 0 Post Procedural Pain: 3 Response to  Treatment: Procedure was tolerated well Level of Consciousness (Post- Awake and Alert procedure): Post Debridement Measurements of Total Wound Length: (cm) 8 Width: (cm) 7 Depth: (cm) 0.2 Volume: (cm) 8.796 Character of Wound/Ulcer Post Debridement: Requires Further Debridement Severity of Tissue Post Debridement: Fat layer exposed Post Procedure Diagnosis Same as Pre-procedure Electronic Signature(s) Signed: 06/11/2021 3:50:41 PM By: [name] Signed: 06/11/2021 5:47:56 PM By: [name] Entered By: [name] on 06/11/2021 15:23:41 -------------------------------------------------------------------------------- HPI Details Patient Name: Date of Service: [name], [name] 06/11/2021 2:15 PM Medical Record Number: [id number] Patient Account Number: [id number] Date of Birth/Sex: Treating RN: 03/18/1956 (65 y.o. Male) [name] Primary Care Provider: Chana Porter Other Clinician: Referring Provider: Treating Provider/Extender: [name] in Treatment: 0 History of Present Illness HPI Description: Admission 06/11/2021 [name], [name] is a 66 year old male with a past medical history of chronic venous insufficiency and essential hypertension that presents to the clinic for a 30-month history of nonhealing ulcer to his left lower extremity. He thinks it started out as a bug bite and he scratched it. He has currently been keeping the area covered. He reports chronic pain to the wound bed. He has been given Keflex for this issue. He denies purulent drainage. He denies systemic signs of infection. 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 He is reported to not be a candidate for ablation due to morbid obesity and inability to wear compression. Electronic Signature(s) Signed: 06/11/2021 3:50:41 PM By: [name] Entered By: [name] on 06/11/2021  15:35:17 -------------------------------------------------------------------------------- Physical Exam Details Patient Name: Date of Service: [name] 06/11/2021 2:15 PM Medical Record Number: [id number] Patient Account Number: [id number] Date of Birth/Sex: Treating RN: 09-28-1955 (65 y.o. Male) [name] Primary Care Provider: Chana Porter Other Clinician: Referring Provider: Treating Provider/Extender: [name] in Treatment: 0 Constitutional respirations regular, non-labored and within target range for patient.. Cardiovascular 2+ dorsalis pedis/posterior  tibialis pulses. Psychiatric pleasant and cooperative. Notes Left lower extremity: Open wound with nonviable tissue throughout and scant granulation tissue. Mild odor. Mild pain to palpation. No obvious signs of surrounding infeciton. Electronic Signature(s) Signed: 06/11/2021 3:50:41 PM By: [name] Signed: 06/11/2021 3:50:41 PM By: [name] Entered By: [name] on 06/11/2021 15:37:01 -------------------------------------------------------------------------------- Physician Orders Details Patient Name: Date of Service: [name], [name] 06/11/2021 2:15 PM Medical Record Number: [id number] Patient Account Number: [id number] Date of Birth/Sex: Treating RN: 03-13-1956 (65 y.o. Male) [name] Primary Care Provider: Chana Porter Other Clinician: Referring Provider: Treating Provider/Extender: [name] in Treatment: 0 Verbal / Phone Orders: No Diagnosis Coding ICD-10 Coding Code Description [phone] Non-pressure chronic ulcer of other part of left lower leg with fat layer exposed I89.0 Lymphedema, not elsewhere classified I87.312 Chronic venous hypertension (idiopathic) with ulcer of left lower extremity I10 Essential (primary) hypertension Follow-up Appointments ppointment in 1 week. - Dr. Heber [place] and Tammi Klippel, RN  Thursday Room 6 06/18/2021 Return A Other: - Someone from [facility] will call you to schedule an appointment for arterial studies. Bathing/ Shower/ Hygiene May shower with protection but Porter not get wound dressing(s) wet. Edema Control - Lymphedema / SCD / Other Elevate legs to the level of the heart or above for 30 minutes daily and/or when sitting, a frequency of: - 3-4 times throughout the day. Avoid standing for long periods of time. Exercise regularly Moisturize legs daily. - right leg every night before bed. Wound Treatment Wound #1 - Lower Leg Wound Laterality: Left, Lateral Cleanser: Soap and Water 1 x Per Week Discharge Instructions: May shower and wash wound with dial antibacterial soap and water prior to dressing change. Cleanser: Wound Cleanser 1 x Per Week Discharge Instructions: Cleanse the wound with wound cleanser prior to applying a clean dressing using gauze sponges, not tissue or cotton balls. Peri-Wound Care: Triamcinolone 15 (g) 1 x Per Week Discharge Instructions: Use triamcinolone 15 (g) as directed Peri-Wound Care: Sween Lotion (Moisturizing lotion) 1 x Per Week Discharge Instructions: Apply moisturizing lotion as directed Topical: Gentamicin 1 x Per Week Discharge Instructions: As directed by physician Prim Dressing: Hydrofera Blue Classic Foam, 4x4 in 1 x Per Week ary Discharge Instructions: Moisten with saline prior to apply over gentamicin ointment. Secondary Dressing: ABD Pad, 8x10 1 x Per Week Discharge Instructions: Apply over primary dressing as directed. Secondary Dressing: Zetuvit Plus 4x8 in 1 x Per Week Discharge Instructions: Apply over primary dressing as directed. Secondary Dressing: CarboFLEX Odor Control Dressing, 4x4 in 1 x Per Week Discharge Instructions: Apply over primary dressing as directed. Compression Wrap: Kerlix Roll 4.5x3.1 (in/yd) 1 x Per Week Discharge Instructions: Apply Kerlix and Coban compression as directed. Compression  Wrap: Coban Self-Adherent Wrap 4x5 (in/yd) 1 x Per Week Discharge Instructions: Apply over Kerlix as directed. Laboratory naerobe culture (MICRO) Bacteria identified in Unspecified specimen by A LOINC Code: [id number] Convenience Name: Anerobic culture Services and Therapies rterial Studies- Bilateral with ABIs and TBIs - [facility] arterial studies with ABIs and TBIs. A Electronic Signature(s) Signed: 06/11/2021 3:50:41 PM By: [name] Entered By: [name] on 06/11/2021 15:47:06 -------------------------------------------------------------------------------- Problem List Details Patient Name: Date of Service: [name], [name] 06/11/2021 2:15 PM Medical Record Number: [id number] Patient Account Number: [id number] Date of Birth/Sex: Treating RN: 12-14-1955 (65 y.o. Male) [name] Primary Care Provider: Chana Porter Other Clinician: Referring Provider: Treating Provider/Extender: [name] in Treatment: 0  Active Problems ICD-10 Encounter Code Description Active Date MDM Diagnosis L97.822 Non-pressure chronic ulcer of other part of left lower leg with fat layer exposed2/01/2022 No Yes I89.0 Lymphedema, not elsewhere classified 06/11/2021 No Yes I87.312 Chronic venous hypertension (idiopathic) with ulcer of left lower extremity 06/11/2021 No Yes I10 Essential (primary) hypertension 06/11/2021 No Yes Inactive Problems Resolved Problems Electronic Signature(s) Signed: 06/11/2021 3:50:41 PM By: [name] Entered By: [name] on 06/11/2021 15:31:27 -------------------------------------------------------------------------------- Progress Note Details Patient Name: Date of Service: [name], [name] 06/11/2021 2:15 PM Medical Record Number: [id number] Patient Account Number: [id number] Date of Birth/Sex: Treating RN: March 07, 1956 (65 y.o. Male) [name] Primary Care Provider: Chana Porter Other  Clinician: Referring Provider: Treating Provider/Extender: [name] in Treatment: 0 Subjective Chief Complaint Information obtained from Patient Left lower extremity wounds History of Present Illness (HPI) Admission 06/11/2021 Mr. Ramond Boleyn, [name] is a 66 year old male with a past medical history of chronic venous insufficiency and essential hypertension that presents to the clinic for a 51-month history of nonhealing ulcer to his left lower extremity. He thinks it started out as a bug bite and he scratched it. He has currently been keeping the area covered. He reports chronic pain to the wound bed. He has been given Keflex for this issue. He denies purulent drainage. He denies systemic signs of infection. 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 He is reported to not be a candidate for ablation due to morbid obesity and inability to wear compression. Patient History Information obtained from Patient. Allergies No Known Allergies Family History Cancer - Father,Mother. Social History Never smoker, Marital Status - Married, Alcohol Use - Moderate, Drug Use - No History, Caffeine Use - Never. Medical History Respiratory Denies history of Aspiration, Asthma, Chronic Obstructive Pulmonary Disease (COPD), Pneumothorax, Sleep Apnea, Tuberculosis Cardiovascular Patient has history of Hypertension Denies history of Angina, Arrhythmia, Congestive Heart Failure, Coronary Artery Disease, Deep Vein Thrombosis, Hypotension, Myocardial Infarction, Peripheral Arterial Disease, Peripheral Venous Disease, Phlebitis, Vasculitis Medical A Surgical History Notes nd Psychiatric Obesity >400lbs Review of Systems (ROS) Constitutional Symptoms (General Health) Denies complaints or symptoms of Fatigue, Fever, Chills, Marked Weight Change. Eyes Complains or has symptoms of Glasses / Contacts -  glasses. Ear/Nose/Mouth/Throat Denies complaints or symptoms of Chronic sinus problems or rhinitis. Respiratory Denies complaints or symptoms of Chronic or frequent coughs, Shortness of Breath, pneumonia Cardiovascular Denies complaints or symptoms of Chest pain. Gastrointestinal Denies complaints or symptoms of Frequent diarrhea, Nausea, Vomiting. Endocrine Denies complaints or symptoms of Heat/cold intolerance. Genitourinary Denies complaints or symptoms of Frequent urination. Integumentary (Skin) Complains or has symptoms of Wounds, left lower leg Musculoskeletal Denies complaints or symptoms of Muscle Pain, Muscle Weakness. Neurologic Denies complaints or symptoms of Numbness/parasthesias. Psychiatric Denies complaints or symptoms of Claustrophobia, Suicidal. Objective Constitutional respirations regular, non-labored and within target range for patient.. Vitals Time Taken: 2:33 PM, Height: 75 in, Weight: 400 lbs, BMI: 50, Temperature: 99.0 F, Pulse: 106 bpm, Respiratory Rate: 20 breaths/min, Blood Pressure: 136/76 mmHg. Cardiovascular 2+ dorsalis pedis/posterior tibialis pulses. Psychiatric pleasant and cooperative. General Notes: Left lower extremity: Open wound with nonviable tissue throughout and scant granulation tissue. Mild odor. Mild pain to palpation. No obvious signs of surrounding infeciton. Integumentary (Hair, Skin) Wound #1 status is Open. Original cause of wound was Gradually Appeared. The date acquired was: 02/14/2021. The wound is located on the Left,Lateral Lower Leg. The wound measures 8cm  length x 7cm width x 0.2cm depth; 43.982cm^2 area and 8.796cm^3 volume. There is Fat Layer (Subcutaneous Tissue) exposed. There is no tunneling or undermining noted. There is a medium amount of serosanguineous drainage noted. The wound margin is flat and intact. There is no granulation within the wound bed. There is a large (67-100%) amount of necrotic tissue within the  wound bed including Adherent Slough. Assessment Active Problems ICD-10 Non-pressure chronic ulcer of other part of left lower leg with fat layer exposed Lymphedema, not elsewhere classified Chronic venous hypertension (idiopathic) with ulcer of left lower extremity Essential (primary) hypertension Patient presents with a 30-month history of nonhealing ulcer to the left lower extremity. Etiology is from possible mosquito bite that was exacerbated by venous insufficiency. I debrided nonviable tissue. There is significant bioburden and I recommended a PCR culture for possible [facility] antibiotics. For now we will use gentamicin ointment along with Hydrofera Blue under Kerlix/Coban. We were unable to obtain ABIs due to patient discomfort. We will order ABIs with TBI's. He does have palpable pulses. If ABIs are normal we will likely go up on the compression. He will need daily compression Therapy once this wound is healed. 48 minutes was spent on the encounter including face-to-face, EMR review and coordination of care. Procedures Wound #1 Pre-procedure diagnosis of Wound #1 is a Venous Leg Ulcer located on the Left,Lateral Lower Leg .Severity of Tissue Pre Debridement is: Fat layer exposed. There was a Excisional Skin/Subcutaneous Tissue Debridement with a total area of 56 sq cm performed by Ryan Shan, Porter. With the following instrument(s): Curette to remove Viable and Non-Viable tissue/material. Material removed includes Subcutaneous Tissue, Slough, Skin: Dermis, Skin: Epidermis, and Fibrin/Exudate after achieving pain control using Other (benzocaine 20%). A time out was conducted at 15:15, prior to the start of the procedure. A Minimum amount of bleeding was controlled with Pressure. The procedure was tolerated well with a pain level of 0 throughout and a pain level of 3 following the procedure. Post Debridement Measurements: 8cm length x 7cm width x 0.2cm depth; 8.796cm^3 volume. Character  of Wound/Ulcer Post Debridement requires further debridement. Severity of Tissue Post Debridement is: Fat layer exposed. Post procedure Diagnosis Wound #1: Same as Pre-Procedure Plan Follow-up Appointments: Return Appointment in 1 week. - Dr. Heber [place] and Tammi Klippel, RN Thursday Room 6 06/18/2021 Other: - Someone from [facility] will call you to schedule an appointment for arterial studies. Bathing/ Shower/ Hygiene: May shower with protection but Porter not get wound dressing(s) wet. Edema Control - Lymphedema / SCD / Other: Elevate legs to the level of the heart or above for 30 minutes daily and/or when sitting, a frequency of: - 3-4 times throughout the day. Avoid standing for long periods of time. Exercise regularly Moisturize legs daily. - right leg every night before bed. Laboratory ordered were: Anerobic culture Services and Therapies ordered were: Arterial Studies- Bilateral with ABIs and TBIs - [facility] arterial studies with ABIs and TBIs. WOUND #1: - Lower Leg Wound Laterality: Left, Lateral Cleanser: Soap and Water 1 x Per Week/ Discharge Instructions: May shower and wash wound with dial antibacterial soap and water prior to dressing change. Cleanser: Wound Cleanser 1 x Per Week/ Discharge Instructions: Cleanse the wound with wound cleanser prior to applying a clean dressing using gauze sponges, not tissue or cotton balls. Peri-Wound Care: Triamcinolone 15 (g) 1 x Per Week/ Discharge Instructions: Use triamcinolone 15 (g) as directed Peri-Wound Care: Sween Lotion (Moisturizing lotion) 1 x Per Week/ Discharge  Instructions: Apply moisturizing lotion as directed Topical: Gentamicin 1 x Per Week/ Discharge Instructions: As directed by physician Prim Dressing: Hydrofera Blue Classic Foam, 4x4 in 1 x Per Week/ ary Discharge Instructions: Moisten with saline prior to apply over gentamicin ointment. Secondary Dressing: ABD Pad, 8x10 1 x Per Week/ Discharge Instructions: Apply  over primary dressing as directed. Secondary Dressing: Zetuvit Plus 4x8 in 1 x Per Week/ Discharge Instructions: Apply over primary dressing as directed. Secondary Dressing: CarboFLEX Odor Control Dressing, 4x4 in 1 x Per Week/ Discharge Instructions: Apply over primary dressing as directed. Com pression Wrap: Kerlix Roll 4.5x3.1 (in/yd) 1 x Per Week/ Discharge Instructions: Apply Kerlix and Coban compression as directed. Com pression Wrap: Coban Self-Adherent Wrap 4x5 (in/yd) 1 x Per Week/ Discharge Instructions: Apply over Kerlix as directed. 1. In office sharp debridement 2. PCR culture 3. Gentamicin Hydrofera Blue under Kerlix/Coban 4. ABIs with TBI's 5. Follow-up in 1 week Electronic Signature(s) Signed: 06/11/2021 3:50:41 PM By: [name] Entered By: [name] on 06/11/2021 15:49:53 -------------------------------------------------------------------------------- HxROS Details Patient Name: Date of Service: [name], [place]. 06/11/2021 2:15 PM Medical Record Number: [id number] Patient Account Number: [id number] Date of Birth/Sex: Treating RN: October 13, 1955 (65 y.o. Male) Sharyn Creamer Primary Care Provider: Chana Porter Other Clinician: Referring Provider: Treating Provider/Extender: [name] in Treatment: 0 Information Obtained From Patient Constitutional Symptoms (General Health) Complaints and Symptoms: Negative for: Fatigue; Fever; Chills; Marked Weight Change Eyes Complaints and Symptoms: Positive for: Glasses / Contacts - glasses Ear/Nose/Mouth/Throat Complaints and Symptoms: Negative for: Chronic sinus problems or rhinitis Respiratory Complaints and Symptoms: Negative for: Chronic or frequent coughs; Shortness of Breath Review of System Notes: pneumonia Medical History: Negative for: Aspiration; Asthma; Chronic Obstructive Pulmonary Disease (COPD); Pneumothorax; Sleep Apnea;  Tuberculosis Cardiovascular Complaints and Symptoms: Negative for: Chest pain Medical History: Positive for: Hypertension Negative for: Angina; Arrhythmia; Congestive Heart Failure; Coronary Artery Disease; Deep Vein Thrombosis; Hypotension; Myocardial Infarction; Peripheral Arterial Disease; Peripheral Venous Disease; Phlebitis; Vasculitis Gastrointestinal Complaints and Symptoms: Negative for: Frequent diarrhea; Nausea; Vomiting Endocrine Complaints and Symptoms: Negative for: Heat/cold intolerance Genitourinary Complaints and Symptoms: Negative for: Frequent urination Integumentary (Skin) Complaints and Symptoms: Positive for: Wounds Review of System Notes: left lower leg Musculoskeletal Complaints and Symptoms: Negative for: Muscle Pain; Muscle Weakness Neurologic Complaints and Symptoms: Negative for: Numbness/parasthesias Psychiatric Complaints and Symptoms: Negative for: Claustrophobia; Suicidal Medical History: Past Medical History Notes: Obesity >400lbs Hematologic/Lymphatic Immunological Oncologic Immunizations Pneumococcal Vaccine: Received Pneumococcal Vaccination: No Implantable Devices None Family and Social History 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[employer]; Never smoker; Marital Status - Married; Alcohol Use: Moderate; Drug Use: No History; Caffeine Use: Never; Financial Concerns: No; Food, Clothing or Shelter Needs: No; Support System Lacking: No; Transportation Concerns: No Electronic Signature(s) Signed: 06/11/2021 3:50:41 PM By: [name] Signed: 06/11/2021 4:48:24 PM By: Sharyn Creamer RN, Porter Previous Signature: 06/11/2021 8:04:36 AM Version By: Sharyn Creamer RN, Porter Entered By: Sharyn Creamer on 06/11/2021 14:37:41 -------------------------------------------------------------------------------- [place] Details Patient Name: Date of Service: [name] 06/11/2021 Medical Record Number: [id number] Patient Account Number: [id number] Date of  Birth/Sex: Treating RN: 1956/03/01 (65 y.o. Male) [name] Primary Care Provider: Chana Porter Other Clinician: Referring Provider: Treating Provider/Extender: [name] in Treatment: 0 Diagnosis Coding ICD-10 Codes Code Description [phone] Non-pressure chronic ulcer of other part of left lower leg with fat layer exposed I89.0 Lymphedema, not elsewhere classified I87.312 Chronic venous hypertension (idiopathic) with ulcer of left lower extremity I10 Essential (primary)  hypertension Facility Procedures CPT4 Code: [id number] Description: [place] VISIT-LEV 3 EST PT Modifier: Quantity: 1 CPT4 Code: [id number] Description: [id number] - DEB SUBQ TISSUE 20 SQ CM/< ICD-10 Diagnosis Description L97.822 Non-pressure chronic ulcer of other part of left lower leg with fat layer expos Modifier: ed Quantity: 1 CPT4 Code: [id number] Description: [id number] - DEB SUBQ TISS EA ADDL 20CM ICD-10 Diagnosis Description L97.822 Non-pressure chronic ulcer of other part of left lower leg with fat layer expos Modifier: ed Quantity: 2 Physician Procedures : CPT4 Code Description Modifier [id number] - WC PHYS LEVEL 4 - NEW PT ICD-10 Diagnosis Description L97.822 Non-pressure chronic ulcer of other part of left lower leg with fat layer exposed I89.0 Lymphedema, not elsewhere classified I87.312 Chronic  venous hypertension (idiopathic) with ulcer of left lower extremity I10 Essential (primary) hypertension Quantity: 1 : [id number] - WC PHYS SUBQ TISS 20 SQ CM ICD-10 Diagnosis Description L97.822 Non-pressure chronic ulcer of other part of left lower leg with fat layer exposed Quantity: 1 : [id number] - WC PHYS SUBQ TISS EA ADDL 20 CM ICD-10 Diagnosis Description L97.822 Non-pressure chronic ulcer of other part of left lower leg with fat layer exposed Quantity: 2 Electronic Signature(s) Signed: 06/11/2021 4:35:29 PM By: [name] Signed: 06/11/2021 5:47:56 PM  By: [name] Previous Signature: 06/11/2021 3:50:41 PM Version By: [name] Entered By: [name] on 06/11/2021 16:33:15

## 2021-06-18 ENCOUNTER — Other Ambulatory Visit: Payer: Self-pay

## 2021-06-18 ENCOUNTER — Encounter (HOSPITAL_BASED_OUTPATIENT_CLINIC_OR_DEPARTMENT_OTHER): Payer: No Typology Code available for payment source | Admitting: Internal Medicine

## 2021-06-18 DIAGNOSIS — L97822 Non-pressure chronic ulcer of other part of left lower leg with fat layer exposed: Secondary | ICD-10-CM

## 2021-06-18 DIAGNOSIS — I87312 Chronic venous hypertension (idiopathic) with ulcer of left lower extremity: Secondary | ICD-10-CM | POA: Diagnosis not present

## 2021-06-18 NOTE — Progress Notes (Signed)
SEQUOYAH, CLYATT ([id number]) Visit Report for 06/18/2021 Arrival Information Details Patient Name: Date of Service: [place], [name] 06/18/2021 10:45 A M Medical Record Number: [id number] Patient Account Number: [id number] Date of Birth/Sex: Treating RN: [birth year] (65 y.o. Male) Antonieta Iba Primary Care Fidencia Mccloud: [name] Other Clinician: Referring Shamarcus Hoheisel: Treating Amori Cooperman/Extender: Derek Jack in Treatment: 1 Visit Information History Since Last Visit Added or deleted any medications: No Patient Arrived: Ambulatory Any new allergies or adverse reactions: No Arrival Time: 11:00 Had a fall or experienced change in No Transfer Assistance: None activities of daily living that may affect Patient Identification Verified: Yes risk of falls: Secondary Verification Process Completed: Yes Signs or symptoms of abuse/neglect since last visito No Patient Requires Transmission-Based Precautions: No Hospitalized since last visit: No Patient Has Alerts: Yes Implantable device outside of the clinic excluding No Patient Alerts: Patient on Blood Thinner cellular tissue based products placed in the center since last visit: Has Dressing in Place as Prescribed: Yes Has Compression in Place as Prescribed: Yes Pain Present Now: Yes Electronic Signature(s) Signed: 06/18/2021 5:10:40 PM By: Antonieta Iba Entered By: Antonieta Iba on 06/18/2021 11:01:03 -------------------------------------------------------------------------------- Encounter Discharge Information Details Patient Name: Date of Service: [place], [name] 06/18/2021 10:45 A M Medical Record Number: [id number] Patient Account Number: [id number] Date of Birth/Sex: Treating RN: 06-29-1955 (65 y.o. Male) Antonieta Iba Primary Care Loralye Loberg: [name] Other Clinician: Referring Kordel Leavy: Treating Ayala Ribble/Extender: Derek Jack in Treatment: 1 Encounter Discharge  Information Items Post Procedure Vitals Discharge Condition: Stable Temperature (F): 98.8 Ambulatory Status: Ambulatory Pulse (bpm): 90 Discharge Destination: Home Respiratory Rate (breaths/min): 20 Transportation: Private Auto Blood Pressure (mmHg): 153/95 Schedule Follow-up Appointment: Yes Clinical Summary of Care: Provided on 06/18/2021 Form Type Recipient Paper Patient Patient Electronic Signature(s) Signed: 06/18/2021 5:10:40 PM By: Antonieta Iba Entered By: Antonieta Iba on 06/18/2021 11:40:37 -------------------------------------------------------------------------------- Lower Extremity Assessment Details Patient Name: Date of Service: RO Remi Haggard 06/18/2021 10:45 A M Medical Record Number: [id number] Patient Account Number: [id number] Date of Birth/Sex: Treating RN: Jun 15, 1955 (65 y.o. Male) Antonieta Iba Primary Care Khali Perella: [name] Other Clinician: Referring Dyanna Seiter: Treating Novi Calia/Extender: Derek Jack in Treatment: 1 Edema Assessment Assessed: [name]: Yes] Franne Forts: No] Edema: [Left: Ye] [Right: s] Calf Left: Right: Point of Measurement: 41 cm From Medial Instep 52 cm Ankle Left: Right: Point of Measurement: 15 cm From Medial Instep 30.9 cm Vascular Assessment Pulses: Dorsalis Pedis Palpable: [Left:Yes] Electronic Signature(s) Signed: 06/18/2021 5:10:40 PM By: Antonieta Iba Entered By: Antonieta Iba on 06/18/2021 11:09:25 -------------------------------------------------------------------------------- Multi Wound Chart Details Patient Name: Date of Service: [name], Vikki Ports 06/18/2021 10:45 A M Medical Record Number: [id number] Patient Account Number: [id number] Date of Birth/Sex: Treating RN: 07/14/1955 (66 y.o. Male) Primary Care Rhyse Loux: [name] Other Clinician: Referring Analissa Bayless: Treating Taiden Raybourn/Extender: Derek Jack in Treatment: 1 Vital Signs Height(in):  75 Pulse(bpm): 90 Weight(lbs): 400 Blood Pressure(mmHg): 153/95 Body Mass Index(BMI): 50 Temperature(F): 98.8 Respiratory Rate(breaths/min): 20 Photos: [1:Left, Lateral Lower Leg] [N/A:N/A N/A] Wound Location: [1:Gradually Appeared] [N/A:N/A] Wounding Event: [1:Venous Leg Ulcer] [N/A:N/A] Primary Etiology: [1:Lymphedema] [N/A:N/A] Secondary Etiology: [1:Hypertension] [N/A:N/A] Comorbid History: [1:02/14/2021] [N/A:N/A] Date Acquired: [1:1] [N/A:N/A] Weeks of Treatment: [1:Open] [N/A:N/A] Wound Status: [1:No] [N/A:N/A] Wound Recurrence: [1:Yes] [N/A:N/A] Clustered Wound: [1:8x6.8x0.2] [N/A:N/A] Measurements L x W x D (cm) [1:42.726] [N/A:N/A] A (cm) : rea [1:8.545] [N/A:N/A] Volume (cm) : [1:2.90%] [N/A:N/A] % Reduction in A rea: [1:2.90%] [N/A:N/A] % Reduction in Volume: [  1:Full Thickness Without Exposed] [N/A:N/A] Classification: [1:Support Structures Medium] [N/A:N/A] Exudate A mount: [1:Purulent] [N/A:N/A] Exudate Type: [1:yellow, brown, green] [N/A:N/A] Exudate Color: [1:Distinct, outline attached] [N/A:N/A] Wound Margin: [1:Small (1-33%)] [N/A:N/A] Granulation A mount: [1:Red] [N/A:N/A] Granulation Quality: [1:Large (67-100%)] [N/A:N/A] Necrotic A mount: [1:Fat Layer (Subcutaneous Tissue): Yes N/A] Exposed Structures: [1:Fascia: No Tendon: No Muscle: No Joint: No Bone: No None] [N/A:N/A] Epithelialization: [1:Debridement - Excisional] [N/A:N/A] Debridement: Pre-procedure Verification/Time Out 11:19 [N/A:N/A] Taken: [1:Other] [N/A:N/A] Pain Control: [1:Subcutaneous, Slough] [N/A:N/A] Tissue Debrided: [1:Skin/Subcutaneous Tissue] [N/A:N/A] Level: [1:54.4] [N/A:N/A] Debridement A (sq cm): [1:rea Curette] [N/A:N/A] Instrument: [1:Minimum] [N/A:N/A] Bleeding: [1:Pressure] [N/A:N/A] Hemostasis A chieved: [1:Procedure was tolerated well] [N/A:N/A] Debridement Treatment Response: [1:8x6.8x0.2] [N/A:N/A] Post Debridement Measurements L x W x D (cm) [1:8.545]  [N/A:N/A] Post Debridement Volume: (cm) [1:Debridement] [N/A:N/A] Treatment Notes Wound #1 (Lower Leg) Wound Laterality: Left, Lateral Cleanser Soap and Water Discharge Instruction: May shower and wash wound with dial antibacterial soap and water prior to dressing change. Wound Cleanser Discharge Instruction: Cleanse the wound with wound cleanser prior to applying a clean dressing using gauze sponges, not tissue or cotton balls. Peri-Wound Care Topical Gentamicin Discharge Instruction: As directed by physician Primary Dressing Hydrofera Blue Ready Foam, 4x5 in Discharge Instruction: Apply to wound bed as instructed Secondary Dressing Zetuvit Plus 4x8 in Discharge Instruction: Apply over primary dressing as directed. Secured With [employer], 4.5x3.1 (in/yd) Discharge Instruction: Secure with Kerlix as directed. 22M Medipore H Soft Cloth Surgical T ape, 4 x 10 (in/yd) Discharge Instruction: Secure with tape as directed. Compression Wrap Compression Stockings Add-Ons Electronic Signature(s) Signed: 06/18/2021 12:34:45 PM By: [name] DO Entered By: [name] on 06/18/2021 12:12:06 -------------------------------------------------------------------------------- Multi-Disciplinary Care Plan Details Patient Name: Date of Service: [place], [name] 06/18/2021 10:45 A M Medical Record Number: [id number] Patient Account Number: [id number] Date of Birth/Sex: Treating RN: 1956-02-07 (65 y.o. Male) Antonieta Iba Primary Care Mellie Buccellato: [name] Other Clinician: Referring Yocelin Vanlue: Treating Parneet Glantz/Extender: Derek Jack in Treatment: 1 Active Inactive Necrotic Tissue Nursing Diagnoses: Knowledge deficit related to management of necrotic/devitalized tissue Goals: Necrotic/devitalized tissue will be minimized in the wound bed Date Initiated: 06/11/2021 Target Resolution Date: 07/10/2021 Goal Status:  Active Interventions: Assess patient pain level pre-, during and post procedure and prior to discharge Provide education on necrotic tissue and debridement process Treatment Activities: Apply topical anesthetic as ordered : 06/11/2021 Notes: Nutrition Nursing Diagnoses: Potential for alteratiion in Nutrition/Potential for imbalanced nutrition Goals: Patient/caregiver agrees to and verbalizes understanding of need to obtain nutritional consultation Date Initiated: 06/11/2021 Target Resolution Date: 07/10/2021 Goal Status: Active Interventions: Assess patient nutrition upon admission and as needed per policy Provide education on nutrition Treatment Activities: Giving encouragement to exercise : 06/11/2021 Special diet education : 06/11/2021 Notes: Orientation to the Wound Care Program Nursing Diagnoses: Knowledge deficit related to the wound healing center program Goals: Patient/caregiver will verbalize understanding of the Wound Healing Center Program Date Initiated: 06/11/2021 Target Resolution Date: 07/09/2021 Goal Status: Active Interventions: Provide education on orientation to the wound center Notes: Venous Leg Ulcer Nursing Diagnoses: Potential for venous Insuffiency (use before diagnosis confirmed) Goals: Patient will maintain optimal edema control Date Initiated: 06/11/2021 Target Resolution Date: 07/10/2021 Goal Status: Active Patient/caregiver will verbalize understanding of disease process and disease management Date Initiated: 06/11/2021 Target Resolution Date: 07/10/2021 Goal Status: Active Interventions: Assess peripheral edema status every visit. Compression as ordered Provide education on venous insufficiency Notes: Wound/Skin Impairment Nursing Diagnoses: Knowledge deficit related to ulceration/compromised skin integrity Goals: Patient/caregiver  will verbalize understanding of skin care regimen Date Initiated: 06/11/2021 Target Resolution Date: 07/09/2021 Goal  Status: Active Interventions: Assess patient/caregiver ability to obtain necessary supplies Assess patient/caregiver ability to perform ulcer/skin care regimen upon admission and as needed Provide education on ulcer and skin care Treatment Activities: Skin care regimen initiated : 06/11/2021 Topical wound management initiated : 06/11/2021 Notes: Electronic Signature(s) Signed: 06/18/2021 5:10:40 PM By: Antonieta Iba Entered By: Antonieta Iba on 06/18/2021 11:15:49 -------------------------------------------------------------------------------- Pain Assessment Details Patient Name: Date of Service: Renetta Chalk 06/18/2021 10:45 A M Medical Record Number: [id number] Patient Account Number: [id number] Date of Birth/Sex: Treating RN: 10/02/1955 (65 y.o. Male) Antonieta Iba Primary Care Neal Trulson: [name] Other Clinician: Referring Honesty Menta: Treating Nirav Sweda/Extender: Derek Jack in Treatment: 1 Active Problems Location of Pain Severity and Description of Pain Patient Has Paino Yes Site Locations Pain Location: Pain Location: Pain in Ulcers With Dressing Change: Yes Duration of the Pain. Constant / Intermittento Intermittent Rate the pain. Current Pain Level: 9 Character of Pain Describe the Pain: Burning, Throbbing Pain Management and Medication Current Pain Management: Medication: Yes Cold Application: No Rest: Yes Massage: No Activity: No T.E.N.S.: No Heat Application: No Leg drop or elevation: No Is the Current Pain Management Adequate: Inadequate How does your wound impact your activities of daily livingo Sleep: No Bathing: No Appetite: No Relationship With Others: No Bladder Continence: No Emotions: No Bowel Continence: No Work: No Toileting: No Drive: No Dressing: No Hobbies: No Electronic Signature(s) Signed: 06/18/2021 5:10:40 PM By: Antonieta Iba Entered By: Antonieta Iba on 06/18/2021  11:06:10 -------------------------------------------------------------------------------- Patient/Caregiver Education Details Patient Name: Date of Service: Renetta Chalk 2/16/2023andnbsp10:45 A M Medical Record Number: [id number] Patient Account Number: [id number] Date of Birth/Gender: Treating RN: 02-13-1956 (65 y.o. Male) Antonieta Iba Primary Care Physician: [name] Other Clinician: Referring Physician: Treating Physician/Extender: Derek Jack in Treatment: 1 Education Assessment Education Provided To: Patient Education Topics Provided Infection: Methods: Explain/Verbal, Printed Responses: State content correctly Venous: Methods: Demonstration, Explain/Verbal, Printed Responses: State content correctly Wound/Skin Impairment: Methods: Explain/Verbal, Printed Responses: State content correctly Electronic Signature(s) Signed: 06/18/2021 5:10:40 PM By: Antonieta Iba Entered By: Antonieta Iba on 06/18/2021 11:16:19 -------------------------------------------------------------------------------- Wound Assessment Details Patient Name: Date of Service: Renetta Chalk 06/18/2021 10:45 A M Medical Record Number: [id number] Patient Account Number: [id number] Date of Birth/Sex: Treating RN: [birth year] (65 y.o. Male) Antonieta Iba Primary Care Antoinetta Berrones: [name] Other Clinician: Referring Gabriellah Rabel: Treating Chayce Robbins/Extender: Derek Jack in Treatment: 1 Wound Status Wound Number: 1 Primary Etiology: Venous Leg Ulcer Wound Location: Left, Lateral Lower Leg Secondary Etiology: Lymphedema Wounding Event: Gradually Appeared Wound Status: Open Date Acquired: 02/14/2021 Comorbid History: Hypertension Weeks Of Treatment: 1 Clustered Wound: Yes Photos Wound Measurements Length: (cm) 8 Width: (cm) 6.8 Depth: (cm) 0.2 Area: (cm) 42.726 Volume: (cm) 8.545 % Reduction in Area: 2.9% % Reduction in  Volume: 2.9% Epithelialization: None Tunneling: No Undermining: No Wound Description Classification: Full Thickness Without Exposed Support Struct Wound Margin: Distinct, outline attached Exudate Amount: Medium Exudate Type: Purulent Exudate Color: yellow, brown, green ures Foul Odor After Cleansing: No Slough/Fibrino Yes Wound Bed Granulation Amount: Small (1-33%) Exposed Structure Granulation Quality: Red Fascia Exposed: No Necrotic Amount: Large (67-100%) Fat Layer (Subcutaneous Tissue) Exposed: Yes Necrotic Quality: Adherent Slough Tendon Exposed: No Muscle Exposed: No Joint Exposed: No Bone Exposed: No Treatment Notes Wound #1 (Lower Leg) Wound Laterality: Left, Lateral Cleanser Soap and Water Discharge Instruction: May shower  and wash wound with dial antibacterial soap and water prior to dressing change. Wound Cleanser Discharge Instruction: Cleanse the wound with wound cleanser prior to applying a clean dressing using gauze sponges, not tissue or cotton balls. Peri-Wound Care Topical Gentamicin Discharge Instruction: As directed by physician Primary Dressing Hydrofera Blue Ready Foam, 4x5 in Discharge Instruction: Apply to wound bed as instructed Secondary Dressing Zetuvit Plus 4x8 in Discharge Instruction: Apply over primary dressing as directed. Secured With [employer], 4.5x3.1 (in/yd) Discharge Instruction: Secure with Kerlix as directed. 44M Medipore H Soft Cloth Surgical T ape, 4 x 10 (in/yd) Discharge Instruction: Secure with tape as directed. Compression Wrap Compression Stockings Add-Ons Electronic Signature(s) Signed: 06/18/2021 5:10:40 PM By: Antonieta Iba Entered By: Antonieta Iba on 06/18/2021 11:10:47 -------------------------------------------------------------------------------- Vitals Details Patient Name: Date of Service: [name], [place]. 06/18/2021 10:45 A M Medical Record Number: [id number] Patient Account Number:  [id number] Date of Birth/Sex: Treating RN: June 04, 1955 (65 y.o. Male) Antonieta Iba Primary Care Emma Birchler: [name] Other Clinician: Referring [place]: Treating Merrin Mcvicker/Extender: Derek Jack in Treatment: 1 Vital Signs Time Taken: 11:03 Temperature (F): 98.8 Height (in): 75 Pulse (bpm): 90 Weight (lbs): 400 Respiratory Rate (breaths/min): 20 Body Mass Index (BMI): 50 Blood Pressure (mmHg): 153/95 Reference Range: 80 - 120 mg / dl Electronic Signature(s) Signed: 06/18/2021 5:10:40 PM By: Antonieta Iba Entered By: Antonieta Iba on 06/18/2021 11:04:20

## 2021-06-18 NOTE — Progress Notes (Signed)
[name], [name] ([id number]) Visit Report for 06/18/2021 Chief Complaint Document Details Patient Name: Date of Service: [name], [name] 06/18/2021 10:45 A M Medical Record Number: [id number] Patient Account Number: [id number] Date of Birth/Sex: Treating RN: 03-27-1956 66 y.o. Male) Primary Care Provider: Chana Porter Other Clinician: Referring Provider: Treating Provider/Extender: [name] in Treatment: 1 Information Obtained from: Patient Chief Complaint Left lower extremity wounds Electronic Signature(s) Signed: 06/18/2021 12:34:45 PM By: Ryan Shan DO Entered By: [name] on 06/18/2021 12:12:33 -------------------------------------------------------------------------------- Debridement Details Patient Name: Date of Service: [name], [name] 06/18/2021 10:45 A M Medical Record Number: [id number] Patient Account Number: [id number] Date of Birth/Sex: Treating RN: 05/29/1955 (65 y.o. Male) [name] Primary Care Provider: Chana Porter Other Clinician: Referring Provider: Treating Provider/Extender: [name] in Treatment: 1 Debridement Performed for Assessment: Wound #1 Left,Lateral Lower Leg Performed By: Physician Ryan Shan, DO Debridement Type: Debridement Severity of Tissue Pre Debridement: Fat layer exposed Level of Consciousness (Pre-procedure): Awake and Alert Pre-procedure Verification/Time Out Yes - 11:19 Taken: Start Time: 11:20 Pain Control: Other : benzocaine 20% T Area Debrided (L x W): otal 8 (cm) x 6.8 (cm) = 54.4 (cm) Tissue and other material debrided: Non-Viable, Slough, Subcutaneous, Slough Level: Skin/Subcutaneous Tissue Debridement Description: Excisional Instrument: Curette Bleeding: Minimum Hemostasis Achieved: Pressure End Time: 11:24 Response to Treatment: Procedure was tolerated well Level of Consciousness (Post- Awake and Alert procedure): Post  Debridement Measurements of Total Wound Length: (cm) 8 Width: (cm) 6.8 Depth: (cm) 0.2 Volume: (cm) 8.545 Character of Wound/Ulcer Post Debridement: Stable Severity of Tissue Post Debridement: Fat layer exposed Post Procedure Diagnosis Same as Pre-procedure Electronic Signature(s) Signed: 06/18/2021 12:34:45 PM By: Ryan Shan DO Signed: 06/18/2021 5:10:40 PM By: [name] Entered By: [name] on 06/18/2021 11:25:17 -------------------------------------------------------------------------------- HPI Details Patient Name: Date of Service: [name], [name] 06/18/2021 10:45 A M Medical Record Number: [id number] Patient Account Number: [id number] Date of Birth/Sex: Treating RN: 10-Jan-1956 (66 y.o. Male) Primary Care Provider: Chana Porter Other Clinician: Referring Provider: Treating Provider/Extender: [name] in Treatment: 1 History of Present Illness HPI Description: Admission 06/11/2021 [name], [name] is a 66 year old male with a past medical history of chronic venous insufficiency and essential hypertension that presents to the clinic for a 57-month history of nonhealing ulcer to his left lower extremity. He thinks it started out as a bug bite and he scratched it. He has currently been keeping the area covered. He reports chronic pain to the wound bed. He has been given Keflex for this issue. He denies purulent drainage. He denies systemic signs of infection. 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 He is reported to not be a candidate for ablation due to morbid obesity and inability to wear compression. 2/16; patient presents for follow-up. He has no issues or complaints today. He states he tolerated the compression wrap well. He continues to report chronic pain to the wound site. Electronic Signature(s) Signed: 06/18/2021 12:34:45 PM By: Ryan Shan  DO Entered By: [name] on 06/18/2021 12:13:02 -------------------------------------------------------------------------------- Physical Exam Details Patient Name: Date of Service: [name] 06/18/2021 10:45 A M Medical Record Number: [id number] Patient Account Number: [id number] Date of Birth/Sex: Treating RN: 24-Jan-1956 (66 y.o. Male) Primary Care Provider: Chana Porter Other Clinician: Referring Provider: Treating Provider/Extender: [name] in Treatment: 1 Constitutional respirations regular, non-labored  and within target range for patient.. Cardiovascular 2+ dorsalis pedis/posterior tibialis pulses. Psychiatric pleasant and cooperative. Notes Left lower extremity: Open wounds with nonviable tissue throughout and scant granulation tissue. Maceration noted. Mild odor. Mild pain to palpation. No obvious signs of surrounding infeciton. Electronic Signature(s) Signed: 06/18/2021 12:34:45 PM By: Ryan Shan DO Signed: 06/18/2021 12:34:45 PM By: Ryan Shan DO Entered By: [name] on 06/18/2021 12:26:44 -------------------------------------------------------------------------------- Physician Orders Details Patient Name: Date of Service: [name], [name] 06/18/2021 10:45 A M Medical Record Number: [id number] Patient Account Number: [id number] Date of Birth/Sex: Treating RN: 1955-06-08 (65 y.o. Male) [name] Primary Care Provider: Chana Porter Other Clinician: Referring Provider: Treating Provider/Extender: [name] in Treatment: 1 Verbal / Phone Orders: No Diagnosis Coding ICD-10 Coding Code Description [phone] Non-pressure chronic ulcer of other part of left lower leg with fat layer exposed I89.0 Lymphedema, not elsewhere classified I87.312 Chronic venous hypertension (idiopathic) with ulcer of left lower extremity I10 Essential (primary) hypertension Follow-up  Appointments ppointment in 1 week. - Dr. Heber Porter and Ryan Anna, RN (Room 7) Return A Other: - -Gentamicin Cream ordered, pick up at your pharmacy. -Follow up with [facility] to schedule appt -[facility] topical antibiotics ordered, they will be sent to your home. Bring them to next appt. Bathing/ Shower/ Hygiene May shower with protection but do not get wound dressing(s) wet. Edema Control - Lymphedema / SCD / Other Elevate legs to the level of the heart or above for 30 minutes daily and/or when sitting, a frequency of: - 3-4 times throughout the day. Avoid standing for long periods of time. Exercise regularly Moisturize legs daily. - right leg every night before bed. Additional Orders / Instructions Follow Nutritious Diet Wound Treatment Wound #1 - Lower Leg Wound Laterality: Left, Lateral Cleanser: Soap and Water 1 x Per Day/30 Days Discharge Instructions: May shower and wash wound with dial antibacterial soap and water prior to dressing change. Cleanser: Wound Cleanser 1 x Per Day/30 Days Discharge Instructions: Cleanse the wound with wound cleanser prior to applying a clean dressing using gauze sponges, not tissue or cotton balls. Topical: Gentamicin 1 x Per Day/30 Days Discharge Instructions: As directed by physician Prim Dressing: Hydrofera Blue Ready Foam, 4x5 in (DME) (Generic) 1 x Per Day/30 Days ary Discharge Instructions: Apply to wound bed as instructed Secondary Dressing: Zetuvit Plus 4x8 in (DME) (Generic) 1 x Per Day/30 Days Discharge Instructions: Apply over primary dressing as directed. Secured With: [employer], 4.5x3.1 (in/yd) (DME) (Generic) 1 x Per Day/30 Days Discharge Instructions: Secure with Kerlix as directed. Secured With: 35M Medipore H Soft Cloth Surgical T ape, 4 x 10 (in/yd) (DME) (Generic) 1 x Per Day/30 Days Discharge Instructions: Secure with tape as directed. Patient Medications llergies: No Known Allergies A Notifications Medication Indication Start  End 06/18/2021 gentamicin DOSE 1 - topical 0.1 % cream - 1 application daily Electronic Signature(s) Signed: 06/18/2021 12:34:45 PM By: Ryan Shan DO Previous Signature: 06/18/2021 12:27:43 PM Version By: Ryan Shan DO Entered By: [name] on 06/18/2021 12:28:11 -------------------------------------------------------------------------------- Problem List Details Patient Name: Date of Service: [name], [name] 06/18/2021 10:45 A M Medical Record Number: [id number] Patient Account Number: [id number] Date of Birth/Sex: Treating RN: 15-Jul-1955 (66 y.o. Male) [name] Primary Care Provider: Chana Porter Other Clinician: Referring Provider: Treating Provider/Extender: [name] in Treatment: 1 Active Problems ICD-10 Encounter Code Description Active Date MDM Diagnosis [id number] Non-pressure chronic ulcer of other part of left lower leg with  fat layer exposed2/01/2022 No Yes I89.0 Lymphedema, not elsewhere classified 06/11/2021 No Yes I87.312 Chronic venous hypertension (idiopathic) with ulcer of left lower extremity 06/11/2021 No Yes I10 Essential (primary) hypertension 06/11/2021 No Yes Inactive Problems Resolved Problems Electronic Signature(s) Signed: 06/18/2021 12:34:45 PM By: Ryan Shan DO Entered By: [name] on 06/18/2021 12:11:58 -------------------------------------------------------------------------------- Progress Note Details Patient Name: Date of Service: [name], [name] 06/18/2021 10:45 A M Medical Record Number: [id number] Patient Account Number: [id number] Date of Birth/Sex: Treating RN: Apr 16, 1956 (66 y.o. Male) Primary Care Provider: Chana Porter Other Clinician: Referring Provider: Treating Provider/Extender: [name] in Treatment: 1 Subjective Chief Complaint Information obtained from Patient Left lower extremity wounds History of Present Illness  (HPI) Admission 06/11/2021 Mr. Nijay Lansing, [name] is a 66 year old male with a past medical history of chronic venous insufficiency and essential hypertension that presents to the clinic for a 74-month history of nonhealing ulcer to his left lower extremity. He thinks it started out as a bug bite and he scratched it. He has currently been keeping the area covered. He reports chronic pain to the wound bed. He has been given Keflex for this issue. He denies purulent drainage. He denies systemic signs of infection. 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 He is reported to not be a candidate for ablation due to morbid obesity and inability to wear compression. 2/16; patient presents for follow-up. He has no issues or complaints today. He states he tolerated the compression wrap well. He continues to report chronic pain to the wound site. Patient History Information obtained from Patient. Family History Cancer - Father,Mother. Social History Never smoker, Marital Status - Married, Alcohol Use - Moderate, Drug Use - No History, Caffeine Use - Never. Medical History Respiratory Denies history of Aspiration, Asthma, Chronic Obstructive Pulmonary Disease (COPD), Pneumothorax, Sleep Apnea, Tuberculosis Cardiovascular Patient has history of Hypertension Denies history of Angina, Arrhythmia, Congestive Heart Failure, Coronary Artery Disease, Deep Vein Thrombosis, Hypotension, Myocardial Infarction, Peripheral Arterial Disease, Peripheral Venous Disease, Phlebitis, Vasculitis Medical A Surgical History Notes nd Psychiatric Obesity >400lbs Objective Constitutional respirations regular, non-labored and within target range for patient.. Vitals Time Taken: 11:03 AM, Height: 75 in, Weight: 400 lbs, BMI: 50, Temperature: 98.8 F, Pulse: 90 bpm, Respiratory Rate: 20 breaths/min, Blood Pressure: 153/95 mmHg. Cardiovascular 2+ dorsalis  pedis/posterior tibialis pulses. Psychiatric pleasant and cooperative. General Notes: Left lower extremity: Open wounds with nonviable tissue throughout and scant granulation tissue. Maceration noted. Mild odor. Mild pain to palpation. No obvious signs of surrounding infeciton. Integumentary (Hair, Skin) Wound #1 status is Open. Original cause of wound was Gradually Appeared. The date acquired was: 02/14/2021. The wound has been in treatment 1 weeks. The wound is located on the Left,Lateral Lower Leg. The wound measures 8cm length x 6.8cm width x 0.2cm depth; 42.726cm^2 area and 8.545cm^3 volume. There is Fat Layer (Subcutaneous Tissue) exposed. There is no tunneling or undermining noted. There is a medium amount of purulent drainage noted. The wound margin is distinct with the outline attached to the wound base. There is small (1-33%) red granulation within the wound bed. There is a large (67-100%) amount of necrotic tissue within the wound bed including Adherent Slough. Assessment Active Problems ICD-10 Non-pressure chronic ulcer of other part of left lower leg with fat layer exposed Lymphedema, not elsewhere classified Chronic venous hypertension (idiopathic) with ulcer of left lower extremity Essential (primary) hypertension Patient's wound has  more maceration noted. I debrided nonviable tissue. At last clinic visit the PCR culture grew Serratia marcescens, Proteus mirabilis, Enterococcus Faecalis, Staphylococcus aureus and actinognum schaeelii. We had ordered [facility] antibiotic for the patient however he has not received this. We will follow this up. At this time I recommended using gentamicin ointment daily and keeping the area covered. He did not do favorably with the wrap at this time. However I think he will do better in the future once were able to start the [facility] antibiotic Along with [facility]. Procedures Wound #1 Pre-procedure diagnosis of Wound #1 is a Venous Leg Ulcer  located on the Left,Lateral Lower Leg .Severity of Tissue Pre Debridement is: Fat layer exposed. There was a Excisional Skin/Subcutaneous Tissue Debridement with a total area of 54.4 sq cm performed by [name], DO. With the following instrument(s): Curette to remove Non-Viable tissue/material. Material removed includes Subcutaneous Tissue and Slough and after achieving pain control using Other (benzocaine 20%). No specimens were taken. A time out was conducted at 11:19, prior to the start of the procedure. A Minimum amount of bleeding was controlled with Pressure. The procedure was tolerated well. Post Debridement Measurements: 8cm length x 6.8cm width x 0.2cm depth; 8.545cm^3 volume. Character of Wound/Ulcer Post Debridement is stable. Severity of Tissue Post Debridement is: Fat layer exposed. Post procedure Diagnosis Wound #1: Same as Pre-Procedure Plan Follow-up Appointments: Return Appointment in 1 week. - Dr. Mikey Bussing and Lennox Laity, RN (Room 7) Other: - -Gentamicin Cream ordered, pick up at your pharmacy. -Follow up with [facility] to schedule appt -[facility] topical antibiotics ordered, they will be sent to your home. Bring them to next appt. Bathing/ Shower/ Hygiene: May shower with protection but do not get wound dressing(s) wet. Edema Control - Lymphedema / SCD / Other: Elevate legs to the level of the heart or above for 30 minutes daily and/or when sitting, a frequency of: - 3-4 times throughout the day. Avoid standing for long periods of time. Exercise regularly Moisturize legs daily. - right leg every night before bed. Additional Orders / Instructions: Follow Nutritious Diet The following medication(s) was prescribed: gentamicin topical 0.1 % cream 1 1 application daily starting 06/18/2021 WOUND #1: - Lower Leg Wound Laterality: Left, Lateral Cleanser: Soap and Water 1 x Per Day/30 Days Discharge Instructions: May shower and wash wound with dial antibacterial soap and water prior to  dressing change. Cleanser: Wound Cleanser 1 x Per Day/30 Days Discharge Instructions: Cleanse the wound with wound cleanser prior to applying a clean dressing using gauze sponges, not tissue or cotton balls. Topical: Gentamicin 1 x Per Day/30 Days Discharge Instructions: As directed by physician Prim Dressing: Hydrofera Blue Ready Foam, 4x5 in (DME) (Generic) 1 x Per Day/30 Days ary Discharge Instructions: Apply to wound bed as instructed Secondary Dressing: Zetuvit Plus 4x8 in (DME) (Generic) 1 x Per Day/30 Days Discharge Instructions: Apply over primary dressing as directed. Secured With: [employer], 4.5x3.1 (in/yd) (DME) (Generic) 1 x Per Day/30 Days Discharge Instructions: Secure with Kerlix as directed. Secured With: 75M Medipore H Soft Cloth Surgical T ape, 4 x 10 (in/yd) (DME) (Generic) 1 x Per Day/30 Days Discharge Instructions: Secure with tape as directed. 1. In office sharp debridement 2. Gentamicin ointment daily 3. Follow-up [facility] antibiotic order 4. Follow-up in 1 week Electronic Signature(s) Signed: 06/18/2021 12:34:45 PM By: [name] DO Entered By: [name] on 06/18/2021 12:34:08 -------------------------------------------------------------------------------- HxROS Details Patient Name: Date of Service: [name], SA MUEL O. 06/18/2021 10:45 A M  Medical Record Number: [id number] Patient Account Number: [id number] Date of Birth/Sex: Treating RN: 1955/12/08 (66 y.o. Male) Primary Care Provider: Other Clinician: Chana Porter Referring Provider: Treating Provider/Extender: [name] in Treatment: 1 Information Obtained From Patient Respiratory Medical History: Negative for: Aspiration; Asthma; Chronic Obstructive Pulmonary Disease (COPD); Pneumothorax; Sleep Apnea; Tuberculosis Cardiovascular Medical History: Positive for: Hypertension Negative for: Angina; Arrhythmia; Congestive Heart Failure; Coronary Artery  Disease; Deep Vein Thrombosis; Hypotension; Myocardial Infarction; Peripheral Arterial Disease; Peripheral Venous Disease; Phlebitis; Vasculitis Psychiatric Medical History: Past Medical History Notes: Obesity >400lbs Immunizations Pneumococcal Vaccine: Received Pneumococcal Vaccination: No Implantable Devices None Family and Social History Cancer: Yes - Father,Mother; Never smoker; Marital Status - Married; Alcohol Use: Moderate; Drug Use: No History; Caffeine Use: Never; Financial Concerns: No; Food, Clothing or Shelter Needs: No; Support System Lacking: No; Transportation Concerns: No Electronic Signature(s) Signed: 06/18/2021 12:34:45 PM By: Ryan Shan DO Entered By: [name] on 06/18/2021 12:13:10 -------------------------------------------------------------------------------- SuperBill Details Patient Name: Date of Service: Piedad Climes, [name] 06/18/2021 Medical Record Number: [id number] Patient Account Number: [id number] Date of Birth/Sex: Treating RN: 26-Jul-1955 (65 y.o. Male) [name] Primary Care Provider: Chana Porter Other Clinician: Referring Provider: Treating Provider/Extender: [name] in Treatment: 1 Diagnosis Coding ICD-10 Codes Code Description [phone] Non-pressure chronic ulcer of other part of left lower leg with fat layer exposed I89.0 Lymphedema, not elsewhere classified I87.312 Chronic venous hypertension (idiopathic) with ulcer of left lower extremity I10 Essential (primary) hypertension Facility Procedures CPT4 Code: [id number] Description: [id number] - DEB SUBQ TISSUE 20 SQ CM/< ICD-10 Diagnosis Description L97.822 Non-pressure chronic ulcer of other part of left lower leg with fat layer ex Modifier: posed Quantity: 1 CPT4 Code: [id number] IC Description: 045 - DEB SUBQ TISS EA ADDL 20CM D-10 Diagnosis Description L97.822 Non-pressure chronic ulcer of other part of left lower leg with fat layer  ex Modifier: 2 posed Quantity: Physician Procedures : CPT4 Code Description Modifier [id number] - WC PHYS SUBQ TISS 20 SQ CM ICD-10 Diagnosis Description L97.822 Non-pressure chronic ulcer of other part of left lower leg with fat layer exposed Quantity: 1 : [id number] - WC PHYS SUBQ TISS EA ADDL 20 CM ICD-10 Diagnosis Description L97.822 Non-pressure chronic ulcer of other part of left lower leg with fat layer exposed Quantity: 2 Electronic Signature(s) Signed: 06/18/2021 12:34:45 PM By: Ryan Shan DO Entered By: [name] on 06/18/2021 12:34:27

## 2021-06-25 ENCOUNTER — Other Ambulatory Visit: Payer: Self-pay | Admitting: Internal Medicine

## 2021-06-25 ENCOUNTER — Other Ambulatory Visit: Payer: Self-pay

## 2021-06-25 ENCOUNTER — Encounter (HOSPITAL_BASED_OUTPATIENT_CLINIC_OR_DEPARTMENT_OTHER): Payer: No Typology Code available for payment source | Admitting: Internal Medicine

## 2021-06-25 DIAGNOSIS — I87312 Chronic venous hypertension (idiopathic) with ulcer of left lower extremity: Secondary | ICD-10-CM | POA: Diagnosis not present

## 2021-06-25 DIAGNOSIS — L97822 Non-pressure chronic ulcer of other part of left lower leg with fat layer exposed: Secondary | ICD-10-CM | POA: Diagnosis not present

## 2021-06-25 NOTE — Progress Notes (Signed)
[name], [name] ([id number]) Visit Report for 06/25/2021 Chief Complaint Document Details Patient Name: Date of Service: [name], [name] 06/25/2021 11:00 A M Medical Record Number: [id number] Patient Account Number: [id number] Date of Birth/Sex: Treating RN: 1955-09-04 (66 y.o. M) Primary Care Provider: Chana Bode Other Clinician: Referring Provider: Treating Provider/Extender: Leveda Anna in Treatment: 2 Information Obtained from: Patient Chief Complaint Left lower extremity wounds Electronic Signature(s) Signed: 06/25/2021 11:34:59 AM By: Kalman Shan DO Entered By: Kalman Shan on 06/25/2021 11:27:37 -------------------------------------------------------------------------------- Debridement Details Patient Name: Date of Service: [name], [place]. 06/25/2021 11:00 A M Medical Record Number: [id number] Patient Account Number: [id number] Date of Birth/Sex: Treating RN: [birth year] (66 y.o. [name], Meta.Reding Primary Care Provider: Chana Bode Other Clinician: Referring Provider: Treating Provider/Extender: Leveda Anna in Treatment: 2 Debridement Performed for Assessment: Wound #1 Left,Lateral Lower Leg Performed By: Physician Kalman Shan, DO Debridement Type: Debridement Severity of Tissue Pre Debridement: Fat layer exposed Level of Consciousness (Pre-procedure): Awake and Alert Pre-procedure Verification/Time Out Yes - 11:10 Taken: Start Time: 11:11 Pain Control: Lidocaine 5% topical ointment T Area Debrided (L x W): otal 7 (cm) x 5 (cm) = 35 (cm) Tissue and other material debrided: Viable, Non-Viable, Slough, Subcutaneous, Fibrin/Exudate, Slough Level: Skin/Subcutaneous Tissue Debridement Description: Excisional Instrument: Curette Bleeding: Minimum Hemostasis Achieved: Pressure End Time: 11:19 Procedural Pain: 3 Post Procedural Pain: 4 Response to Treatment: Procedure was tolerated well Level of  Consciousness (Post- Awake and Alert procedure): Post Debridement Measurements of Total Wound Length: (cm) 9 Width: (cm) 6.5 Depth: (cm) 0.1 Volume: (cm) 4.595 Character of Wound/Ulcer Post Debridement: Requires Further Debridement Severity of Tissue Post Debridement: Fat layer exposed Post Procedure Diagnosis Same as Pre-procedure Electronic Signature(s) Signed: 06/25/2021 11:34:59 AM By: Kalman Shan DO Signed: 06/25/2021 5:46:52 PM By: Deon Pilling RN, [name] Entered By: Deon Pilling on 06/25/2021 11:19:31 -------------------------------------------------------------------------------- HPI Details Patient Name: Date of Service: [name], [name]. 06/25/2021 11:00 A M Medical Record Number: [id number] Patient Account Number: [id number] Date of Birth/Sex: Treating RN: 01/12/1956 (66 y.o. M) Primary Care Provider: Chana Bode Other Clinician: Referring Provider: Treating Provider/Extender: Leveda Anna in Treatment: 2 History of Present Illness HPI Description: Admission 06/11/2021 [name], [name] is a 66 year old male with a past medical history of chronic venous insufficiency and essential hypertension that presents to the clinic for a 88-month history of nonhealing ulcer to his left lower extremity. He thinks it started out as a bug bite and he scratched it. He has currently been keeping the area covered. He reports chronic pain to the wound bed. He has been given Keflex for this issue. He denies purulent drainage. He denies systemic signs of infection. 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 He is reported to not be a candidate for ablation due to morbid obesity and inability to wear compression. 2/16; patient presents for follow-up. He has no issues or complaints today. He states he tolerated the compression wrap well. He continues to report chronic pain to the wound  site. 2/23; patient presents for follow-up. He has no issues or complaints today. He reports improvement in his chronic pain to the wound site. He states that the [facility] antibiotics is arriving this week. He currently denies systemic signs of infection. Electronic Signature(s) Signed: 06/25/2021 11:34:59 AM By: Kalman Shan DO Entered By: Kalman Shan on 06/25/2021 11:28:09 -------------------------------------------------------------------------------- Physical Exam Details Patient  Name: Date of Service: [name], [name] 06/25/2021 11:00 A M Medical Record Number: [id number] Patient Account Number: [id number] Date of Birth/Sex: Treating RN: 01-14-1956 (66 y.o. M) Primary Care Provider: Chana Bode Other Clinician: Referring Provider: Treating Provider/Extender: Leveda Anna in Treatment: 2 Constitutional respirations regular, non-labored and within target range for patient.. Cardiovascular 2+ dorsalis pedis/posterior tibialis pulses. Psychiatric pleasant and cooperative. Notes Left lower extremity: Open wound with nonviable tissue throughout and scant granulation tissue. No signs of surrounding infection. Improved from last clinic visit. Electronic Signature(s) Signed: 06/25/2021 11:34:59 AM By: Kalman Shan DO Entered By: Kalman Shan on 06/25/2021 11:29:37 -------------------------------------------------------------------------------- Physician Orders Details Patient Name: Date of Service: [name], [name]. 06/25/2021 11:00 A M Medical Record Number: [id number] Patient Account Number: [id number] Date of Birth/Sex: Treating RN: April 05, 1956 (66 y.o. [name], Meta.Reding Primary Care Provider: Chana Bode Other Clinician: Referring Provider: Treating Provider/Extender: Leveda Anna in Treatment: 2 Verbal / Phone Orders: No Diagnosis Coding ICD-10 Coding Code Description [phone] Non-pressure chronic  ulcer of other part of left lower leg with fat layer exposed I89.0 Lymphedema, not elsewhere classified I87.312 Chronic venous hypertension (idiopathic) with ulcer of left lower extremity I10 Essential (primary) hypertension Follow-up Appointments ppointment in 1 week. - Dr. Heber [place] and Tammi Klippel, Room 8 Return A Other: - -Gentamicin Cream continue till [facility] topical antibiotics arrive, then stop Gentamicin ointment and use the Keystone. -Will send the Arterial study test to [facility] to see if they are able to perform the test. -[facility]stone topical antibiotics bring in at next appt as well. Bathing/ Shower/ Hygiene May shower with protection but do not get wound dressing(s) wet. Edema Control - Lymphedema / SCD / Other Elevate legs to the level of the heart or above for 30 minutes daily and/or when sitting, a frequency of: - 3-4 times throughout the day. Avoid standing for long periods of time. Exercise regularly Moisturize legs daily. - right leg every night before bed. Additional Orders / Instructions Follow Nutritious Diet Wound Treatment Wound #1 - Lower Leg Wound Laterality: Left, Lateral Cleanser: Soap and Water 1 x Per Day/30 Days Discharge Instructions: May shower and wash wound with dial antibacterial soap and water prior to dressing change. Cleanser: Wound Cleanser 1 x Per Day/30 Days Discharge Instructions: Cleanse the wound with wound cleanser prior to applying a clean dressing using gauze sponges, not tissue or cotton balls. Topical: Gentamicin 1 x Per Day/30 Days Discharge Instructions: ****USE THE GENTAMICIN OINTMENT UNTIL THE KEYSTONE TOPICAL ANTIBIOTICS ARRIVES THEN STOP GENTAMICIN OINTMENT .*** Topical: Keystone T opical Antibiotic 1 x Per Day/30 Days Discharge Instructions: ***START ONCE IT ARRIVES**** Prim Dressing: Hydrofera Blue Ready Foam, 4x5 in (Generic) 1 x Per Day/30 Days ary Discharge Instructions: Apply to wound bed as instructed Secondary Dressing:  Zetuvit Plus 4x8 in (Generic) 1 x Per Day/30 Days Discharge Instructions: Apply over primary dressing as directed. Secured With: [employer], 4.5x3.1 (in/yd) (Generic) 1 x Per Day/30 Days Discharge Instructions: Secure with Kerlix as directed. Secured With: 29M Medipore H Soft Cloth Surgical Tape, 4 x 10 (in/yd) (Generic) 1 x Per Day/30 Days Discharge Instructions: Secure with tape as directed. Services and Therapies rterial Studies- Bilateral with ABIs and TBIs - [facility] to perform Arterial Studies with ABIs and TBIs to bilateral lower legs. ICD 10 A code I73.89 CPT code - (ICD10 I89.0 - Lymphedema, not elsewhere classified) Electronic Signature(s) Signed: 06/25/2021 11:34:59 AM By: Kalman Shan DO Entered By: Kalman Shan  on 06/25/2021 11:30:11 Prescription 06/25/2021 -------------------------------------------------------------------------------- Juel Burrow Kalman Shan DO Patient Name: Provider: 07-Jun-1955 [id number] Date of Birth: NPI#: Jerilynn Mages [id number] Sex: DEA #: [phone] Phone #: License #: [place] Patient Address: [place] [address] [place], [place] [address] [place], [place] [address] [phone] Allergies No Known Allergies Provider's Orders rterial Studies- Bilateral with ABIs and TBIs - ICD10: I89.0 - [facility] to perform Arterial Studies with ABIs and TBIs to bilateral lower A legs. ICD 10 code I73.89 CPT code Hand Signature: Date(s): Electronic Signature(s) Signed: 06/25/2021 11:34:59 AM By: Kalman Shan DO Entered By: Kalman Shan on 06/25/2021 11:30:11 -------------------------------------------------------------------------------- Problem List Details Patient Name: Date of Service: [name], [name]. 06/25/2021 11:00 A M Medical Record Number: [id number] Patient Account Number: [id number] Date of Birth/Sex: Treating RN: 12-20-1955 (66  y.o. Hessie Diener Primary Care Provider: Chana Bode Other Clinician: Referring Provider: Treating Provider/Extender: Leveda Anna in Treatment: 2 Active Problems ICD-10 Encounter Code Description Active Date MDM Diagnosis [phone] Non-pressure chronic ulcer of other part of left lower leg with fat layer exposed2/01/2022 No Yes I89.0 Lymphedema, not elsewhere classified 06/11/2021 No Yes I87.312 Chronic venous hypertension (idiopathic) with ulcer of left lower extremity 06/11/2021 No Yes I10 Essential (primary) hypertension 06/11/2021 No Yes Inactive Problems Resolved Problems Electronic Signature(s) Signed: 06/25/2021 11:34:59 AM By: Kalman Shan DO Entered By: Kalman Shan on 06/25/2021 11:26:33 -------------------------------------------------------------------------------- Progress Note Details Patient Name: Date of Service: [name], Chase Picket. 06/25/2021 11:00 A M Medical Record Number: [id number] Patient Account Number: [id number] Date of Birth/Sex: Treating RN: [birth year] (66 y.o. M) Primary Care Provider: Chana Bode Other Clinician: Referring Provider: Treating Provider/Extender: Leveda Anna in Treatment: 2 Subjective Chief Complaint Information obtained from Patient Left lower extremity wounds History of Present Illness (HPI) Admission 06/11/2021 Mr. Jagan Voda, [name] is a 66 year old male with a past medical history of chronic venous insufficiency and essential hypertension that presents to the clinic for a 6-month history of nonhealing ulcer to his left lower extremity. He thinks it started out as a bug bite and he scratched it. He has currently been keeping the area covered. He reports chronic pain to the wound bed. He has been given Keflex for this issue. He denies purulent drainage. He denies systemic signs of infection. He saw a vein and vascular and they did reflux studies that showed a dilated  incompetent great saphenous vein. He is reported to not be a candidate for ablation due to morbid obesity and inability to wear compression. 2/16; patient presents for follow-up. He has no issues or complaints today. He states he tolerated the compression wrap well. He continues to report chronic pain to the wound site. 2/23; patient presents for follow-up. He has no issues or complaints today. He reports improvement in his chronic pain to the wound site. He states that the [facility] antibiotics is arriving this week. He currently denies systemic signs of infection. Patient History Information obtained from Patient. Family History Cancer - Father,Mother. Social History Never smoker, Marital Status - Married, Alcohol Use - Moderate, Drug Use - No History, Caffeine Use - Never. Medical History Respiratory Denies history of Aspiration, Asthma, Chronic Obstructive Pulmonary Disease (COPD), Pneumothorax, Sleep Apnea, Tuberculosis Cardiovascular Patient has history of Hypertension Denies history of Angina, Arrhythmia, Congestive Heart Failure, Coronary Artery Disease, Deep Vein Thrombosis, Hypotension, Myocardial Infarction, Peripheral Arterial Disease, Peripheral Venous Disease, Phlebitis, Vasculitis Medical A Surgical History  Notes nd Psychiatric Obesity >400lbs Objective Constitutional respirations regular, non-labored and within target range for patient.. Vitals Time Taken: 10:58 AM, Height: 75 in, Weight: 400 lbs, BMI: 50, Temperature: 98.5 F, Pulse: 82 bpm, Respiratory Rate: 20 breaths/min, Blood Pressure: 164/94 mmHg. Cardiovascular 2+ dorsalis pedis/posterior tibialis pulses. Psychiatric pleasant and cooperative. General Notes: Left lower extremity: Open wound with nonviable tissue throughout and scant granulation tissue. No signs of surrounding infection. Improved from last clinic visit. Integumentary (Hair, Skin) Wound #1 status is Open. Original cause of wound was Gradually  Appeared. The date acquired was: 02/14/2021. The wound has been in treatment 2 weeks. The wound is located on the Left,Lateral Lower Leg. The wound measures 9cm length x 6.5cm width x 0.2cm depth; 45.946cm^2 area and 9.189cm^3 volume. There is Fat Layer (Subcutaneous Tissue) exposed. There is no tunneling or undermining noted. There is a medium amount of serosanguineous drainage noted. The wound margin is distinct with the outline attached to the wound base. There is medium (34-66%) red granulation within the wound bed. There is a medium (34-66%) amount of necrotic tissue within the wound bed including Adherent Slough. Assessment Active Problems ICD-10 Non-pressure chronic ulcer of other part of left lower leg with fat layer exposed Lymphedema, not elsewhere classified Chronic venous hypertension (idiopathic) with ulcer of left lower extremity Essential (primary) hypertension Patient's wounds have shown improvement in appearance since last clinic visit. I debrided nonviable tissue. No signs of surrounding infection. I recommended continuing gentamicin and Hydrofera Blue. He states that he has been contacted by [facility] and his order should be arriving this week. I recommended he start using the ointment daily When it arrives Along with Hydrofera Blue and stopping gentamicin.. I asked him to bring this into next clinic visit. Procedures Wound #1 Pre-procedure diagnosis of Wound #1 is a Venous Leg Ulcer located on the Left,Lateral Lower Leg .Severity of Tissue Pre Debridement is: Fat layer exposed. There was a Excisional Skin/Subcutaneous Tissue Debridement with a total area of 35 sq cm performed by Kalman Shan, DO. With the following instrument(s): Curette to remove Viable and Non-Viable tissue/material. Material removed includes Subcutaneous Tissue, Slough, and Fibrin/Exudate after achieving pain control using Lidocaine 5% topical ointment. A time out was conducted at 11:10, prior  to the start of the procedure. A Minimum amount of bleeding was controlled with Pressure. The procedure was tolerated well with a pain level of 3 throughout and a pain level of 4 following the procedure. Post Debridement Measurements: 9cm length x 6.5cm width x 0.1cm depth; 4.595cm^3 volume. Character of Wound/Ulcer Post Debridement requires further debridement. Severity of Tissue Post Debridement is: Fat layer exposed. Post procedure Diagnosis Wound #1: Same as Pre-Procedure Plan Follow-up Appointments: Return Appointment in 1 week. - Dr. Heber [place] and Tammi Klippel, Room 8 Other: - -Gentamicin Cream continue till [facility] topical antibiotics arrive, then stop Gentamicin ointment and use the [facility]. -Will send the Arterial study test to [facility] to see if they are able to perform the test. -[facility] topical antibiotics bring in at next appt as well. Bathing/ Shower/ Hygiene: May shower with protection but do not get wound dressing(s) wet. Edema Control - Lymphedema / SCD / Other: Elevate legs to the level of the heart or above for 30 minutes daily and/or when sitting, a frequency of: - 3-4 times throughout the day. Avoid standing for long periods of time. Exercise regularly Moisturize legs daily. - right leg every night before bed. Additional Orders / Instructions: Follow Nutritious Diet Services and Therapies  ordered were: Arterial Studies- Bilateral with ABIs and TBIs - [facility] to perform Arterial Studies with ABIs and TBIs to bilateral lower legs. ICD 10 code I73.89 CPT code WOUND #1: - Lower Leg Wound Laterality: Left, Lateral Cleanser: Soap and Water 1 x Per Day/30 Days Discharge Instructions: May shower and wash wound with dial antibacterial soap and water prior to dressing change. Cleanser: Wound Cleanser 1 x Per Day/30 Days Discharge Instructions: Cleanse the wound with wound cleanser prior to applying a clean dressing using gauze sponges, not tissue or cotton  balls. Topical: Gentamicin 1 x Per Day/30 Days Discharge Instructions: ****USE THE GENTAMICIN OINTMENT UNTIL THE KEYSTONE TOPICAL ANTIBIOTICS ARRIVES THEN STOP GENTAMICIN OINTMENT .*** Topical: Keystone T opical Antibiotic 1 x Per Day/30 Days Discharge Instructions: ***START ONCE IT ARRIVES**** Prim Dressing: Hydrofera Blue Ready Foam, 4x5 in (Generic) 1 x Per Day/30 Days ary Discharge Instructions: Apply to wound bed as instructed Secondary Dressing: Zetuvit Plus 4x8 in (Generic) 1 x Per Day/30 Days Discharge Instructions: Apply over primary dressing as directed. Secured With: [employer], 4.5x3.1 (in/yd) (Generic) 1 x Per Day/30 Days Discharge Instructions: Secure with Kerlix as directed. Secured With: 59M Medipore H Soft Cloth Surgical T ape, 4 x 10 (in/yd) (Generic) 1 x Per Day/30 Days Discharge Instructions: Secure with tape as directed. 1. In office sharp debridement 2. Gentamicin ointment, Hydrofera Blue daily 3. Follow-up in 1 week 4. Start Keystone antibiotic once it arrives and can stop gentamicin Electronic Signature(s) Signed: 06/25/2021 11:34:59 AM By: Kalman Shan DO Entered By: Kalman Shan on 06/25/2021 11:34:32 -------------------------------------------------------------------------------- HxROS Details Patient Name: Date of Service: [name], [name]. 06/25/2021 11:00 A M Medical Record Number: [id number] Patient Account Number: [id number] Date of Birth/Sex: Treating RN: Feb 02, 1956 (66 y.o. M) Primary Care Provider: Chana Bode Other Clinician: Referring Provider: Treating Provider/Extender: Leveda Anna in Treatment: 2 Information Obtained From Patient Respiratory Medical History: Negative for: Aspiration; Asthma; Chronic Obstructive Pulmonary Disease (COPD); Pneumothorax; Sleep Apnea; Tuberculosis Cardiovascular Medical History: Positive for: Hypertension Negative for: Angina; Arrhythmia; Congestive Heart  Failure; Coronary Artery Disease; Deep Vein Thrombosis; Hypotension; Myocardial Infarction; Peripheral Arterial Disease; Peripheral Venous Disease; Phlebitis; Vasculitis Psychiatric Medical History: Past Medical History Notes: Obesity >400lbs Immunizations Pneumococcal Vaccine: Received Pneumococcal Vaccination: No Implantable Devices None Family and Social History Cancer: Yes - Father,Mother; Never smoker; Marital Status - Married; Alcohol Use: Moderate; Drug Use: No History; Caffeine Use: Never; Financial Concerns: No; Food, Clothing or Shelter Needs: No; Support System Lacking: No; Transportation Concerns: No Electronic Signature(s) Signed: 06/25/2021 11:34:59 AM By: Kalman Shan DO Entered By: Kalman Shan on 06/25/2021 11:29:00 -------------------------------------------------------------------------------- SuperBill Details Patient Name: Date of Service: [name], Chase Picket 06/25/2021 Medical Record Number: [id number] Patient Account Number: [id number] Date of Birth/Sex: Treating RN: [birth year] (66 y.o. Hessie Diener Primary Care Provider: Chana Bode Other Clinician: Referring Provider: Treating Provider/Extender: Leveda Anna in Treatment: 2 Diagnosis Coding ICD-10 Codes Code Description [phone] Non-pressure chronic ulcer of other part of left lower leg with fat layer exposed I89.0 Lymphedema, not elsewhere classified I87.312 Chronic venous hypertension (idiopathic) with ulcer of left lower extremity I10 Essential (primary) hypertension Facility Procedures CPT4 Code: [id number] Description: [id number] - DEB SUBQ TISSUE 20 SQ CM/< ICD-10 Diagnosis Description L97.822 Non-pressure chronic ulcer of other part of left lower leg with fat layer expo Modifier: sed Quantity: 1 CPT4 Code: [id number] Description: 11045 - DEB SUBQ TISS EA ADDL 20CM ICD-10 Diagnosis Description L97.822 Non-pressure chronic ulcer of other  part of left lower leg with  fat layer expo Modifier: sed Quantity: 1 Physician Procedures : CPT4 Code Description Modifier [id number] 11042 - WC PHYS SUBQ TISS 20 SQ CM ICD-10 Diagnosis Description L97.822 Non-pressure chronic ulcer of other part of left lower leg with fat layer exposed Quantity: 1 : [id number] - WC PHYS SUBQ TISS EA ADDL 20 CM ICD-10 Diagnosis Description L97.822 Non-pressure chronic ulcer of other part of left lower leg with fat layer exposed Quantity: 1 Electronic Signature(s) Signed: 06/25/2021 11:34:59 AM By: Kalman Shan DO Entered By: Kalman Shan on 06/25/2021 11:34:42

## 2021-07-01 NOTE — Progress Notes (Signed)
BRIGHTEN, LOVITT ([id number]) Visit Report for 06/25/2021 Arrival Information Details Patient Name: Date of Service: [place], [name] 06/25/2021 11:00 A M Medical Record Number: [id number] Patient Account Number: [id number] Date of Birth/Sex: Treating RN: Dec 18, 1955 (66 y.o. Hessie Diener Primary Care Yaiza Palazzola: Chana Bode Other Clinician: Referring Antoine Fiallos: Treating Lylee Corrow/Extender: Leveda Anna in Treatment: 2 Visit Information History Since Last Visit Added or deleted any medications: No Patient Arrived: Ambulatory Any new allergies or adverse reactions: No Arrival Time: 10:58 Had a fall or experienced change in No Accompanied By: self activities of daily living that may affect Transfer Assistance: None risk of falls: Patient Identification Verified: Yes Signs or symptoms of abuse/neglect since last visito No Secondary Verification Process Completed: Yes Hospitalized since last visit: No Patient Requires Transmission-Based Precautions: No Implantable device outside of the clinic excluding No Patient Has Alerts: Yes cellular tissue based products placed in the center Patient Alerts: Patient on Blood Thinner since last visit: Has Dressing in Place as Prescribed: Yes Pain Present Now: Yes Notes Per patient [facility] does not accept his insurance for arterial study test. Per patient [employer] topical antibiotics will arrive at his home tomorrow. Electronic Signature(s) Signed: 06/25/2021 5:46:52 PM By: Deon Pilling RN, [name] Entered By: Deon Pilling on 06/25/2021 11:05:58 -------------------------------------------------------------------------------- Encounter Discharge Information Details Patient Name: Date of Service: [address], [place]. 06/25/2021 11:00 A M Medical Record Number: [id number] Patient Account Number: [id number] Date of Birth/Sex: Treating RN: 1955-07-01 (66 y.o. Hessie Diener Primary Care Pinkie Manger: Chana Bode Other  Clinician: Referring Krysta Bloomfield: Treating Rossie Bretado/Extender: Leveda Anna in Treatment: 2 Encounter Discharge Information Items Post Procedure Vitals Discharge Condition: Stable Temperature (F): 98.5 Ambulatory Status: Ambulatory Pulse (bpm): 82 Discharge Destination: Home Respiratory Rate (breaths/min): 20 Transportation: Private Auto Blood Pressure (mmHg): 164/94 Accompanied By: self Schedule Follow-up Appointment: Yes Clinical Summary of Care: Electronic Signature(s) Signed: 06/25/2021 5:46:52 PM By: Deon Pilling RN, [name] Entered By: Deon Pilling on 06/25/2021 11:24:18 -------------------------------------------------------------------------------- Lower Extremity Assessment Details Patient Name: Date of Service: [name], [place]. 06/25/2021 11:00 A M Medical Record Number: [id number] Patient Account Number: [id number] Date of Birth/Sex: Treating RN: 08-01-1955 (66 y.o. Hessie Diener Primary Care Heiress Williamson: Chana Bode Other Clinician: Referring Kerrigan Gombos: Treating Fontaine Kossman/Extender: Leveda Anna in Treatment: 2 Edema Assessment Assessed: Shirlyn Goltz: Yes] Patrice Paradise: No] Edema: [Left: Ye] [Right: s] Calf Left: Right: Point of Measurement: 41 cm From Medial Instep 36.5 cm Ankle Left: Right: Point of Measurement: 15 cm From Medial Instep 30.5 cm Vascular Assessment Pulses: Dorsalis Pedis Palpable: [Left:Yes] Electronic Signature(s) Signed: 06/25/2021 5:46:52 PM By: Deon Pilling RN, [name] Entered By: Deon Pilling on 06/25/2021 11:08:13 -------------------------------------------------------------------------------- Multi Wound Chart Details Patient Name: Date of Service: [name], [place]. 06/25/2021 11:00 A M Medical Record Number: [id number] Patient Account Number: [id number] Date of Birth/Sex: Treating RN: 1956-04-21 (66 y.o. M) Primary Care Paizleigh Wilds: Chana Bode Other Clinician: Referring Haylea Schlichting: Treating  Jema Deegan/Extender: Leveda Anna in Treatment: 2 Vital Signs Height(in): 75 Pulse(bpm): 31 Weight(lbs): 400 Blood Pressure(mmHg): 164/94 Body Mass Index(BMI): 50 Temperature(F): 98.5 Respiratory Rate(breaths/min): 20 Photos: [1:Left, Lateral Lower Leg] [N/A:N/A N/A] Wound Location: [1:Gradually Appeared] [N/A:N/A] Wounding Event: [1:Venous Leg Ulcer] [N/A:N/A] Primary Etiology: [1:Lymphedema] [N/A:N/A] Secondary Etiology: [1:Hypertension] [N/A:N/A] Comorbid History: [1:02/14/2021] [N/A:N/A] Date Acquired: [1:2] [N/A:N/A] Weeks of Treatment: [1:Open] [N/A:N/A] Wound Status: [1:No] [N/A:N/A] Wound Recurrence: [1:Yes] [N/A:N/A] Clustered Wound: [1:4] [N/A:N/A] Clustered Quantity: [1:9x6.5x0.2] [N/A:N/A] Measurements L x W x  D (cm) [1:45.946] [N/A:N/A] A (cm) : rea [1:9.189] [N/A:N/A] Volume (cm) : [1:-4.50%] [N/A:N/A] % Reduction in A rea: [1:-4.50%] [N/A:N/A] % Reduction in Volume: [1:Full Thickness Without Exposed] [N/A:N/A] Classification: [1:Support Structures Medium] [N/A:N/A] Exudate A mount: [1:Serosanguineous] [N/A:N/A] Exudate Type: [1:red, brown] [N/A:N/A] Exudate Color: [1:Distinct, outline attached] [N/A:N/A] Wound Margin: [1:Medium (34-66%)] [N/A:N/A] Granulation A mount: [1:Red] [N/A:N/A] Granulation Quality: [1:Medium (34-66%)] [N/A:N/A] Necrotic A mount: [1:Fat Layer (Subcutaneous Tissue): Yes N/A] Exposed Structures: [1:Fascia: No Tendon: No Muscle: No Joint: No Bone: No Small (1-33%)] [N/A:N/A] Epithelialization: [1:Debridement - Excisional] [N/A:N/A] Debridement: Pre-procedure Verification/Time Out 11:10 [N/A:N/A] Taken: [1:Lidocaine 5% topical ointment] [N/A:N/A] Pain Control: [1:Subcutaneous, Slough] [N/A:N/A] Tissue Debrided: [1:Skin/Subcutaneous Tissue] [N/A:N/A] Level: [1:35] [N/A:N/A] Debridement A (sq cm): [1:rea Curette] [N/A:N/A] Instrument: [1:Minimum] [N/A:N/A] Bleeding: [1:Pressure] [N/A:N/A] Hemostasis A  chieved: [1:3] [N/A:N/A] Procedural Pain: [1:4] [N/A:N/A] Post Procedural Pain: [1:Procedure was tolerated well] [N/A:N/A] Debridement Treatment Response: [1:9x6.5x0.1] [N/A:N/A] Post Debridement Measurements L x W x D (cm) [1:4.595] [N/A:N/A] Post Debridement Volume: (cm) [1:Debridement] [N/A:N/A] Treatment Notes Wound #1 (Lower Leg) Wound Laterality: Left, Lateral Cleanser Soap and Water Discharge Instruction: May shower and wash wound with dial antibacterial soap and water prior to dressing change. Wound Cleanser Discharge Instruction: Cleanse the wound with wound cleanser prior to applying a clean dressing using gauze sponges, not tissue or cotton balls. Peri-Wound Care Topical Gentamicin Discharge Instruction: ****USE THE GENTAMICIN OINTMENT UNTIL THE KEYSTONE TOPICAL ANTIBIOTICS ARRIVES THEN STOP GENTAMICIN OINTMENT .*** Keystone T opical Antibiotic Discharge Instruction: ***START ONCE IT ARRIVES**** Primary Dressing Hydrofera Blue Ready Foam, 4x5 in Discharge Instruction: Apply to wound bed as instructed Secondary Dressing Zetuvit Plus 4x8 in Discharge Instruction: Apply over primary dressing as directed. Secured With [employer], 4.5x3.1 (in/yd) Discharge Instruction: Secure with Kerlix as directed. 80M Medipore H Soft Cloth Surgical T ape, 4 x 10 (in/yd) Discharge Instruction: Secure with tape as directed. Compression Wrap Compression Stockings Add-Ons Electronic Signature(s) Signed: 06/25/2021 11:34:59 AM By: Kalman Shan DO Entered By: Kalman Shan on 06/25/2021 11:27:17 -------------------------------------------------------------------------------- Multi-Disciplinary Care Plan Details Patient Name: Date of Service: [address], [place]. 06/25/2021 11:00 A M Medical Record Number: [id number] Patient Account Number: [id number] Date of Birth/Sex: Treating RN: 06-Feb-1956 (66 y.o. Hessie Diener Primary Care Tryston Gilliam: Chana Bode Other  Clinician: Referring Allayah Raineri: Treating Kameran Mcneese/Extender: Leveda Anna in Treatment: 2 Active Inactive Necrotic Tissue Nursing Diagnoses: Knowledge deficit related to management of necrotic/devitalized tissue Goals: Necrotic/devitalized tissue will be minimized in the wound bed Date Initiated: 06/11/2021 Target Resolution Date: 07/10/2021 Goal Status: Active Interventions: Assess patient pain level pre-, during and post procedure and prior to discharge Provide education on necrotic tissue and debridement process Treatment Activities: Apply topical anesthetic as ordered : 06/11/2021 Notes: Nutrition Nursing Diagnoses: Potential for alteratiion in Nutrition/Potential for imbalanced nutrition Goals: Patient/caregiver agrees to and verbalizes understanding of need to obtain nutritional consultation Date Initiated: 06/11/2021 Target Resolution Date: 07/10/2021 Goal Status: Active Interventions: Assess patient nutrition upon admission and as needed per policy Provide education on nutrition Treatment Activities: Giving encouragement to exercise : 06/11/2021 Special diet education : 06/11/2021 Notes: Venous Leg Ulcer Nursing Diagnoses: Potential for venous Insuffiency (use before diagnosis confirmed) Goals: Patient will maintain optimal edema control Date Initiated: 06/11/2021 Target Resolution Date: 07/10/2021 Goal Status: Active Patient/caregiver will verbalize understanding of disease process and disease management Date Initiated: 06/11/2021 Target Resolution Date: 07/10/2021 Goal Status: Active Interventions: Assess peripheral edema status every visit. Compression as ordered Provide education on venous insufficiency Notes: Wound/Skin Impairment  Nursing Diagnoses: Knowledge deficit related to ulceration/compromised skin integrity Goals: Patient/caregiver will verbalize understanding of skin care regimen Date Initiated: 06/11/2021 Target Resolution Date:  07/09/2021 Goal Status: Active Interventions: Assess patient/caregiver ability to obtain necessary supplies Assess patient/caregiver ability to perform ulcer/skin care regimen upon admission and as needed Provide education on ulcer and skin care Treatment Activities: Skin care regimen initiated : 06/11/2021 Topical wound management initiated : 06/11/2021 Notes: Electronic Signature(s) Signed: 06/25/2021 5:46:52 PM By: Deon Pilling RN, [name] Entered By: Deon Pilling on 06/25/2021 11:10:54 -------------------------------------------------------------------------------- Pain Assessment Details Patient Name: Date of Service: [name], [place]. 06/25/2021 11:00 A M Medical Record Number: [id number] Patient Account Number: [id number] Date of Birth/Sex: Treating RN: 1955/07/15 (66 y.o. Hessie Diener Primary Care Waylynn Benefiel: Chana Bode Other Clinician: Referring Camaryn Lumbert: Treating Kimbly Eanes/Extender: Leveda Anna in Treatment: 2 Active Problems Location of Pain Severity and Description of Pain Patient Has Paino Yes Site Locations Pain Location: Pain Location: Pain in Ulcers Rate the pain. Current Pain Level: 8 Pain Management and Medication Current Pain Management: Medication: No Cold Application: No Rest: No Massage: No Activity: No T.E.N.S.: No Heat Application: No Leg drop or elevation: No Is the Current Pain Management Adequate: Adequate How does your wound impact your activities of daily livingo Sleep: No Bathing: No Appetite: No Relationship With Others: No Bladder Continence: No Emotions: No Bowel Continence: No Work: No Toileting: No Drive: No Dressing: No Hobbies: No Notes with cleaning pain increases to 8/10. Electronic Signature(s) Signed: 06/25/2021 5:46:52 PM By: Deon Pilling RN, [name] Entered By: Deon Pilling on 06/25/2021  11:07:58 -------------------------------------------------------------------------------- Patient/Caregiver Education Details Patient Name: Date of Service: [name], [place] MUEL O. 2/23/2023andnbsp11:00 [place] Record Number: [id number] Patient Account Number: [id number] Date of Birth/Gender: Treating RN: 03-22-1956 (66 y.o. Hessie Diener Primary Care Physician: Chana Bode Other Clinician: Referring Physician: Treating Physician/Extender: Leveda Anna in Treatment: 2 Education Assessment Education Provided To: Patient Education Topics Provided Wound Debridement: Handouts: Wound Debridement Methods: Explain/Verbal Responses: Reinforcements needed Electronic Signature(s) Signed: 06/25/2021 5:46:52 PM By: Deon Pilling RN, [name] Entered By: Deon Pilling on 06/25/2021 11:12:24 -------------------------------------------------------------------------------- Wound Assessment Details Patient Name: Date of Service: [name], [place]. 06/25/2021 11:00 A M Medical Record Number: [id number] Patient Account Number: [id number] Date of Birth/Sex: Treating RN: Feb 27, 1956 (66 y.o. [name], Meta.Reding Primary Care Priscila Bean: Chana Bode Other Clinician: Referring Malerie Eakins: Treating Teylor Wolven/Extender: Leveda Anna in Treatment: 2 Wound Status Wound Number: 1 Primary Etiology: Venous Leg Ulcer Wound Location: Left, Lateral Lower Leg Secondary Etiology: Lymphedema Wounding Event: Gradually Appeared Wound Status: Open Date Acquired: 02/14/2021 Comorbid History: Hypertension Weeks Of Treatment: 2 Clustered Wound: Yes Photos Wound Measurements Length: (cm) 9 Width: (cm) 6.5 Depth: (cm) 0.2 Clustered Quantity: 4 Area: (cm) 45.946 Volume: (cm) 9.189 % Reduction in Area: -4.5% % Reduction in Volume: -4.5% Epithelialization: Small (1-33%) Tunneling: No Undermining: No Wound Description Classification: Full Thickness Without  Exposed Support Structures Wound Margin: Distinct, outline attached Exudate Amount: Medium Exudate Type: Serosanguineous Exudate Color: red, brown Foul Odor After Cleansing: No Slough/Fibrino Yes Wound Bed Granulation Amount: Medium (34-66%) Exposed Structure Granulation Quality: Red Fascia Exposed: No Necrotic Amount: Medium (34-66%) Fat Layer (Subcutaneous Tissue) Exposed: Yes Necrotic Quality: Adherent Slough Tendon Exposed: No Muscle Exposed: No Joint Exposed: No Bone Exposed: No Treatment Notes Wound #1 (Lower Leg) Wound Laterality: Left, Lateral Cleanser Soap and Water Discharge Instruction: May shower and wash wound with dial antibacterial soap and water prior  to dressing change. Wound Cleanser Discharge Instruction: Cleanse the wound with wound cleanser prior to applying a clean dressing using gauze sponges, not tissue or cotton balls. Peri-Wound Care Topical Gentamicin Discharge Instruction: ****USE THE GENTAMICIN OINTMENT UNTIL THE KEYSTONE TOPICAL ANTIBIOTICS ARRIVES THEN STOP GENTAMICIN OINTMENT .*** Keystone T opical Antibiotic Discharge Instruction: ***START ONCE IT ARRIVES**** Primary Dressing Hydrofera Blue Ready Foam, 4x5 in Discharge Instruction: Apply to wound bed as instructed Secondary Dressing Zetuvit Plus 4x8 in Discharge Instruction: Apply over primary dressing as directed. Secured With [employer], 4.5x3.1 (in/yd) Discharge Instruction: Secure with Kerlix as directed. 28M Medipore H Soft Cloth Surgical T ape, 4 x 10 (in/yd) Discharge Instruction: Secure with tape as directed. Compression Wrap Compression Stockings Add-Ons Electronic Signature(s) Signed: 06/25/2021 5:46:52 PM By: Deon Pilling RN, [name] Signed: 07/01/2021 3:37:08 PM By: Rhae Hammock RN Entered By: Rhae Hammock on 06/25/2021 11:09:22 -------------------------------------------------------------------------------- Vitals Details Patient Name: Date of Service: RO  Dorothea Ogle, [place]. 06/25/2021 11:00 A M Medical Record Number: [id number] Patient Account Number: [id number] Date of Birth/Sex: Treating RN: [birth year] (66 y.o. Hessie Diener Primary Care Quintella Mura: Chana Bode Other Clinician: Referring Kuulei Kleier: Treating Cattaleya Wien/Extender: Leveda Anna in Treatment: 2 Vital Signs Time Taken: 10:58 Temperature (F): 98.5 Height (in): 75 Pulse (bpm): 82 Weight (lbs): 400 Respiratory Rate (breaths/min): 20 Body Mass Index (BMI): 50 Blood Pressure (mmHg): 164/94 Reference Range: 80 - 120 mg / dl Electronic Signature(s) Signed: 06/25/2021 5:46:52 PM By: Deon Pilling RN, [name] Entered By: Deon Pilling on 06/25/2021 11:07:32

## 2021-07-02 ENCOUNTER — Other Ambulatory Visit: Payer: Self-pay

## 2021-07-02 ENCOUNTER — Other Ambulatory Visit: Payer: Self-pay | Admitting: Internal Medicine

## 2021-07-02 ENCOUNTER — Encounter (HOSPITAL_BASED_OUTPATIENT_CLINIC_OR_DEPARTMENT_OTHER): Payer: No Typology Code available for payment source | Attending: Internal Medicine | Admitting: Internal Medicine

## 2021-07-02 DIAGNOSIS — I872 Venous insufficiency (chronic) (peripheral): Secondary | ICD-10-CM | POA: Insufficient documentation

## 2021-07-02 DIAGNOSIS — Z6841 Body Mass Index (BMI) 40.0 and over, adult: Secondary | ICD-10-CM | POA: Insufficient documentation

## 2021-07-02 DIAGNOSIS — G8929 Other chronic pain: Secondary | ICD-10-CM | POA: Insufficient documentation

## 2021-07-02 DIAGNOSIS — I890 Lymphedema, not elsewhere classified: Secondary | ICD-10-CM | POA: Diagnosis not present

## 2021-07-02 DIAGNOSIS — L97822 Non-pressure chronic ulcer of other part of left lower leg with fat layer exposed: Secondary | ICD-10-CM | POA: Diagnosis not present

## 2021-07-02 DIAGNOSIS — I10 Essential (primary) hypertension: Secondary | ICD-10-CM | POA: Insufficient documentation

## 2021-07-02 DIAGNOSIS — S81802A Unspecified open wound, left lower leg, initial encounter: Secondary | ICD-10-CM

## 2021-07-02 NOTE — Progress Notes (Signed)
DOMANIK, RAINVILLE ([id number]) Visit Report for 07/02/2021 Chief Complaint Document Details Patient Name: Date of Service: [place], [name] 07/02/2021 10:30 A M Medical Record Number: [id number] Patient Account Number: [id number] Date of Birth/Sex: Treating RN: July 21, 1955 (66 y.o. [name] Primary Care Provider: [name] Other Clinician: Referring Provider: Treating Provider/Extender: Derek Jack in Treatment: 3 Information Obtained from: Patient Chief Complaint Left lower extremity wounds Electronic Signature(s) Signed: 07/02/2021 12:54:28 PM By: [name] DO Entered By: [name] on 07/02/2021 12:51:34 -------------------------------------------------------------------------------- Debridement Details Patient Name: Date of Service: [name], [place]. 07/02/2021 10:30 A M Medical Record Number: [id number] Patient Account Number: [id number] Date of Birth/Sex: Treating RN: [birth year] (66 y.o. [name], Millard.Loa Primary Care Provider: [name] Other Clinician: Referring Provider: Treating Provider/Extender: Derek Jack in Treatment: 3 Debridement Performed for Assessment: Wound #1 Left,Lateral Lower Leg Performed By: Physician [name], DO Debridement Type: Debridement Severity of Tissue Pre Debridement: Fat layer exposed Level of Consciousness (Pre-procedure): Awake and Alert Pre-procedure Verification/Time Out Yes - 11:15 Taken: Start Time: 11:16 Pain Control: Other : Benzocaine 20% T Area Debrided (L x W): otal 5 (cm) x 5 (cm) = 25 (cm) Tissue and other material debrided: Viable, Non-Viable, Slough, Subcutaneous, Slough Level: Skin/Subcutaneous Tissue Debridement Description: Excisional Instrument: Curette Bleeding: Minimum Hemostasis Achieved: Pressure End Time: 11:21 Procedural Pain: 0 Post Procedural Pain: 0 Response to Treatment: Procedure was tolerated well Level of  Consciousness (Post- Awake and Alert procedure): Post Debridement Measurements of Total Wound Length: (cm) 7 Width: (cm) 6 Depth: (cm) 0.2 Volume: (cm) 6.597 Character of Wound/Ulcer Post Debridement: Requires Further Debridement Severity of Tissue Post Debridement: Fat layer exposed Post Procedure Diagnosis Same as Pre-procedure Electronic Signature(s) Signed: 07/02/2021 12:54:28 PM By: [name] DO Signed: 07/02/2021 5:21:31 PM By: [name], [name] Entered By: Shawn Stall on 07/02/2021 11:22:19 -------------------------------------------------------------------------------- HPI Details Patient Name: Date of Service: [name], SA [place]. 07/02/2021 10:30 A M Medical Record Number: [id number] Patient Account Number: [id number] Date of Birth/Sex: Treating RN: 08/05/1955 (66 y.o. [name] Primary Care Provider: [name] Other Clinician: Referring Provider: Treating Provider/Extender: Derek Jack in Treatment: 3 History of Present Illness HPI Description: Admission 06/11/2021 Mr. Layth Cerezo, [name] is a 66 year old male with a past medical history of chronic venous insufficiency and essential hypertension that presents to the clinic for a 49-month history of nonhealing ulcer to his left lower extremity. He thinks it started out as a bug bite and he scratched it. He has currently been keeping the area covered. He reports chronic pain to the wound bed. He has been given Keflex for this issue. He denies purulent drainage. He denies systemic signs of infection. 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 He is reported to not be a candidate for ablation due to morbid obesity and inability to wear compression. 2/16; patient presents for follow-up. He has no issues or complaints today. He states he tolerated the compression wrap well. He continues to report chronic pain to the wound  site. 2/23; patient presents for follow-up. He has no issues or complaints today. He reports improvement in his chronic pain to the wound site. He states that the [facility] antibiotics is arriving this week. He currently denies systemic signs of infection. 3/2; patient presents for follow-up. He has been using Hydrofera Blue and gentamicin ointment to the wound bed without  any issues. He reports an improvement in chronic pain to the wound bed. He obtained the [facility] antibiotic but has not started it yet. He brought this in today. Electronic Signature(s) Signed: 07/02/2021 12:54:28 PM By: [name] DO Entered By: [name] on 07/02/2021 12:52:11 -------------------------------------------------------------------------------- Physical Exam Details Patient Name: Date of Service: [name], [name] 07/02/2021 10:30 A M Medical Record Number: [id number] Patient Account Number: [id number] Date of Birth/Sex: Treating RN: May 02, 1956 (66 y.o. [name] Primary Care Provider: [name] Other Clinician: Referring Provider: Treating Provider/Extender: Derek Jack in Treatment: 3 Constitutional respirations regular, non-labored and within target range for patient.Marland Kitchen Psychiatric pleasant and cooperative. Notes Left lower extremity: Multiple open wounds with nonviable tissue and granulation tissue present. No signs of surrounding infection. No maceration noted. Electronic Signature(s) Signed: 07/02/2021 12:54:28 PM By: [name] DO Entered By: [name] on 07/02/2021 12:52:45 -------------------------------------------------------------------------------- Physician Orders Details Patient Name: Date of Service: [name], [name] 07/02/2021 10:30 A M Medical Record Number: [id number] Patient Account Number: [id number] Date of Birth/Sex: Treating RN: 26-Jul-1955 (66 y.o. [name] Primary Care Provider: [name] Other  Clinician: Referring Provider: Treating Provider/Extender: Derek Jack in Treatment: 3 Verbal / Phone Orders: No Diagnosis Coding ICD-10 Coding Code Description [phone] Non-pressure chronic ulcer of other part of left lower leg with fat layer exposed I89.0 Lymphedema, not elsewhere classified I87.312 Chronic venous hypertension (idiopathic) with ulcer of left lower extremity I10 Essential (primary) hypertension Follow-up Appointments ppointment in 1 week. - Dr. Mikey Bussing and Yvonne Kendall, Room 8 Return A Other: - Call [facility] to schedule arterial test. Bathing/ Shower/ Hygiene May shower with protection but do not get wound dressing(s) wet. Edema Control - Lymphedema / SCD / Other Elevate legs to the level of the heart or above for 30 minutes daily and/or when sitting, a frequency of: - 3-4 times throughout the day. Avoid standing for long periods of time. Exercise regularly Moisturize legs daily. - right leg every night before bed. Additional Orders / Instructions Follow Nutritious Diet Wound Treatment Wound #1 - Lower Leg Wound Laterality: Left, Lateral Cleanser: Soap and Water 1 x Per Day/30 Days Discharge Instructions: May shower and wash wound with dial antibacterial soap and water prior to dressing change. Cleanser: Wound Cleanser 1 x Per Day/30 Days Discharge Instructions: Cleanse the wound with wound cleanser prior to applying a clean dressing using gauze sponges, not tissue or cotton balls. Topical: Keystone T opical Antibiotic 1 x Per Day/30 Days Discharge Instructions: Use and apply daily to wound bed. Prim Dressing: Hydrofera Blue Ready Foam, 4x5 in (Generic) 1 x Per Day/30 Days ary Discharge Instructions: Apply to wound bed as instructed Secondary Dressing: Zetuvit Plus 4x8 in (Generic) 1 x Per Day/30 Days Discharge Instructions: Apply over primary dressing as directed. Secured With: [employer], 4.5x3.1 (in/yd) (Generic) 1 x  Per Day/30 Days Discharge Instructions: Secure with Kerlix as directed. Secured With: 24M Medipore H Soft Cloth Surgical T ape, 4 x 10 (in/yd) (Generic) 1 x Per Day/30 Days Discharge Instructions: Secure with tape as directed. Electronic Signature(s) Signed: 07/02/2021 12:54:28 PM By: [name] DO Entered By: [name] on 07/02/2021 12:53:00 -------------------------------------------------------------------------------- Problem List Details Patient Name: Date of Service: [name], [place]. 07/02/2021 10:30 A M Medical Record Number: [id number] Patient Account Number: [id number] Date of Birth/Sex: Treating RN: 09-22-1955 (66 y.o. [name] Primary Care Provider: [name] Other Clinician: Referring Provider: Treating Provider/Extender: [name]  Tysinger, Reece Packer in Treatment: 3 Active Problems ICD-10 Encounter Code Description Active Date MDM Diagnosis [phone] Non-pressure chronic ulcer of other part of left lower leg with fat layer exposed2/01/2022 No Yes I89.0 Lymphedema, not elsewhere classified 06/11/2021 No Yes I87.312 Chronic venous hypertension (idiopathic) with ulcer of left lower extremity 06/11/2021 No Yes I10 Essential (primary) hypertension 06/11/2021 No Yes Inactive Problems Resolved Problems Electronic Signature(s) Signed: 07/02/2021 12:54:28 PM By: [name] DO Entered By: [name] on 07/02/2021 12:51:04 -------------------------------------------------------------------------------- Progress Note Details Patient Name: Date of Service: [name], [name] 07/02/2021 10:30 A M Medical Record Number: [id number] Patient Account Number: [id number] Date of Birth/Sex: Treating RN: 1956-02-05 (66 y.o. [name] Primary Care Provider: [name] Other Clinician: Referring Provider: Treating Provider/Extender: Derek Jack in Treatment: 3 Subjective Chief Complaint Information obtained from  Patient Left lower extremity wounds History of Present Illness (HPI) Admission 06/11/2021 Mr. Joncarlo Friberg, [name] is a 66 year old male with a past medical history of chronic venous insufficiency and essential hypertension that presents to the clinic for a 24-month history of nonhealing ulcer to his left lower extremity. He thinks it started out as a bug bite and he scratched it. He has currently been keeping the area covered. He reports chronic pain to the wound bed. He has been given Keflex for this issue. He denies purulent drainage. He denies systemic signs of infection. 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 He is reported to not be a candidate for ablation due to morbid obesity and inability to wear compression. 2/16; patient presents for follow-up. He has no issues or complaints today. He states he tolerated the compression wrap well. He continues to report chronic pain to the wound site. 2/23; patient presents for follow-up. He has no issues or complaints today. He reports improvement in his chronic pain to the wound site. He states that the [facility] antibiotics is arriving this week. He currently denies systemic signs of infection. 3/2; patient presents for follow-up. He has been using Hydrofera Blue and gentamicin ointment to the wound bed without any issues. He reports an improvement in chronic pain to the wound bed. He obtained the [facility] antibiotic but has not started it yet. He brought this in today. Patient History Information obtained from Patient. Family History Cancer - Father,Mother. Social History Never smoker, Marital Status - Married, Alcohol Use - Moderate, Drug Use - No History, Caffeine Use - Never. Medical History Respiratory Denies history of Aspiration, Asthma, Chronic Obstructive Pulmonary Disease (COPD), Pneumothorax, Sleep Apnea, Tuberculosis Cardiovascular Patient has history of  Hypertension Denies history of Angina, Arrhythmia, Congestive Heart Failure, Coronary Artery Disease, Deep Vein Thrombosis, Hypotension, Myocardial Infarction, Peripheral Arterial Disease, Peripheral Venous Disease, Phlebitis, Vasculitis Medical A Surgical History Notes nd Psychiatric Obesity >400lbs Objective Constitutional respirations regular, non-labored and within target range for patient.. Vitals Time Taken: 10:50 AM, Height: 75 in, Weight: 400 lbs, BMI: 50, Temperature: 99 F, Pulse: 85 bpm, Respiratory Rate: 20 breaths/min, Blood Pressure: 195/106 mmHg. General Notes: Per patient has taken BP mediation at 0600. BP has decreased slightly since the first attempt at BP. Advised patient to closely monitor BP, educated patient concerns of elevated BP, still elevated need to follow up with PCP. Provider made aware. Psychiatric pleasant and cooperative. General Notes: Left lower extremity: Multiple open wounds with nonviable tissue and granulation tissue present. No signs of surrounding infection. No maceration noted. Integumentary (Hair, Skin) Wound #  1 status is Open. Original cause of wound was Gradually Appeared. The date acquired was: 02/14/2021. The wound has been in treatment 3 weeks. The wound is located on the Left,Lateral Lower Leg. The wound measures 7cm length x 6cm width x 0.2cm depth; 32.987cm^2 area and 6.597cm^3 volume. There is Fat Layer (Subcutaneous Tissue) exposed. There is no tunneling or undermining noted. There is a medium amount of serosanguineous drainage noted. The wound margin is distinct with the outline attached to the wound base. There is medium (34-66%) red granulation within the wound bed. There is a medium (34-66%) amount of necrotic tissue within the wound bed including Adherent Slough. Assessment Active Problems ICD-10 Non-pressure chronic ulcer of other part of left lower leg with fat layer exposed Lymphedema, not elsewhere classified Chronic venous  hypertension (idiopathic) with ulcer of left lower extremity Essential (primary) hypertension Patient's wound has shown improvement in size and appearance since last clinic visit. I debrided nonviable tissue. I recommended starting [facility] antibiotic and we showed him how to do this today. He can continue with Hydrofera Blue. There are no surrounding signs of infection. He still has not obtained ABIs with TBI's. We will help follow this up to see if he can get these scheduled. Procedures Wound #1 Pre-procedure diagnosis of Wound #1 is a Venous Leg Ulcer located on the Left,Lateral Lower Leg .Severity of Tissue Pre Debridement is: Fat layer exposed. There was a Excisional Skin/Subcutaneous Tissue Debridement with a total area of 25 sq cm performed by [name], DO. With the following instrument(s): Curette to remove Viable and Non-Viable tissue/material. Material removed includes Subcutaneous Tissue and Slough and after achieving pain control using Other (Benzocaine 20%). A time out was conducted at 11:15, prior to the start of the procedure. A Minimum amount of bleeding was controlled with Pressure. The procedure was tolerated well with a pain level of 0 throughout and a pain level of 0 following the procedure. Post Debridement Measurements: 7cm length x 6cm width x 0.2cm depth; 6.597cm^3 volume. Character of Wound/Ulcer Post Debridement requires further debridement. Severity of Tissue Post Debridement is: Fat layer exposed. Post procedure Diagnosis Wound #1: Same as Pre-Procedure Plan Follow-up Appointments: Return Appointment in 1 week. - Dr. Mikey Bussing and Yvonne Kendall, Room 8 Other: - Call [facility] to schedule arterial test. Bathing/ Shower/ Hygiene: May shower with protection but do not get wound dressing(s) wet. Edema Control - Lymphedema / SCD / Other: Elevate legs to the level of the heart or above for 30 minutes daily and/or when sitting, a frequency of: - 3-4 times throughout  the day. Avoid standing for long periods of time. Exercise regularly Moisturize legs daily. - right leg every night before bed. Additional Orders / Instructions: Follow Nutritious Diet WOUND #1: - Lower Leg Wound Laterality: Left, Lateral Cleanser: Soap and Water 1 x Per Day/30 Days Discharge Instructions: May shower and wash wound with dial antibacterial soap and water prior to dressing change. Cleanser: Wound Cleanser 1 x Per Day/30 Days Discharge Instructions: Cleanse the wound with wound cleanser prior to applying a clean dressing using gauze sponges, not tissue or cotton balls. Topical: Keystone T opical Antibiotic 1 x Per Day/30 Days Discharge Instructions: Use and apply daily to wound bed. Prim Dressing: Hydrofera Blue Ready Foam, 4x5 in (Generic) 1 x Per Day/30 Days ary Discharge Instructions: Apply to wound bed as instructed Secondary Dressing: Zetuvit Plus 4x8 in (Generic) 1 x Per Day/30 Days Discharge Instructions: Apply over primary dressing as directed. Secured With: [employer], 4.5x3.1 (in/yd) (Generic) 1 x Per Day/30 Days Discharge Instructions: Secure with Kerlix as directed. Secured With: 27M Medipore H Soft Cloth Surgical T ape, 4 x 10 (in/yd) (Generic) 1 x Per Day/30 Days Discharge Instructions: Secure with tape as directed. 1. In office sharp debridement 2. Keystone antibiotic with Hydrofera Blue 3. Follow-up in 1 week Electronic Signature(s) Signed: 07/02/2021 12:54:28 PM By: [name] DO Entered By: [name] on 07/02/2021 12:53:39 -------------------------------------------------------------------------------- HxROS Details Patient Name: Date of Service: [name], [place]. 07/02/2021 10:30 A M Medical Record Number: [id number] Patient Account Number: [id number] Date of Birth/Sex: Treating RN: March 04, 1956 (66 y.o. [name] Primary Care Provider: [name] Other Clinician: Referring Provider: Treating Provider/Extender: Derek Jack in Treatment: 3 Information Obtained From Patient Respiratory Medical History: Negative for: Aspiration; Asthma; Chronic Obstructive Pulmonary Disease (COPD); Pneumothorax; Sleep Apnea; Tuberculosis Cardiovascular Medical History: Positive for: Hypertension Negative for: Angina; Arrhythmia; Congestive Heart Failure; Coronary Artery Disease; Deep Vein Thrombosis; Hypotension; Myocardial Infarction; Peripheral Arterial Disease; Peripheral Venous Disease; Phlebitis; Vasculitis Psychiatric Medical History: Past Medical History Notes: Obesity >400lbs Immunizations Pneumococcal Vaccine: Received Pneumococcal Vaccination: No Implantable Devices None Family and Social History Cancer: Yes - Father,Mother; Never smoker; Marital Status - Married; Alcohol Use: Moderate; Drug Use: No History; Caffeine Use: Never; Financial Concerns: No; Food, Clothing or Shelter Needs: No; Support System Lacking: No; Transportation Concerns: No Electronic Signature(s) Signed: 07/02/2021 12:54:28 PM By: [name] DO Signed: 07/02/2021 5:21:31 PM By: [name], [name] Entered By: [name] on 07/02/2021 12:52:17 -------------------------------------------------------------------------------- SuperBill Details Patient Name: Date of Service: [name], Vikki Ports 07/02/2021 Medical Record Number: [id number] Patient Account Number: [id number] Date of Birth/Sex: Treating RN: January 04, 1956 (66 y.o. [name], Millard.Loa Primary Care Provider: [name] Other Clinician: Referring Provider: Treating Provider/Extender: Derek Jack in Treatment: 3 Diagnosis Coding ICD-10 Codes Code Description [phone] Non-pressure chronic ulcer of other part of left lower leg with fat layer exposed I89.0 Lymphedema, not elsewhere classified I87.312 Chronic venous hypertension (idiopathic) with ulcer of left lower extremity I10 Essential (primary)  hypertension Facility Procedures CPT4 Code: [id number] Description: 11042 - DEB SUBQ TISSUE 20 SQ CM/< ICD-10 Diagnosis Description L97.822 Non-pressure chronic ulcer of other part of left lower leg with fat layer expo Modifier: sed Quantity: 1 CPT4 Code: [id number] Description: 11045 - DEB SUBQ TISS EA ADDL 20CM ICD-10 Diagnosis Description L97.822 Non-pressure chronic ulcer of other part of left lower leg with fat layer exposed Modifier: Quantity: 1 Physician Procedures : CPT4 Code Description Modifier [id number] 11042 - WC PHYS SUBQ TISS 20 SQ CM ICD-10 Diagnosis Description L97.822 Non-pressure chronic ulcer of other part of left lower leg with fat layer exposed Quantity: 1 : [id number] 11045 - WC PHYS SUBQ TISS EA ADDL 20 CM ICD-10 Diagnosis Description L97.822 Non-pressure chronic ulcer of other part of left lower leg with fat layer exposed Quantity: 1 Electronic Signature(s) Signed: 07/02/2021 12:54:28 PM By: [name] DO Entered By: [name] on 07/02/2021 12:53:54

## 2021-07-02 NOTE — Progress Notes (Signed)
[name], [name] ([id number]) Visit Report for 07/02/2021 Arrival Information Details Patient Name: Date of Service: [name], [name] 07/02/2021 10:30 A M Medical Record Number: [id number] Patient Account Number: [id number] Date of Birth/Sex: Treating RN: 02-07-1956 (66 y.o. [name] Primary Care [name]: [name] Other Clinician: Referring [name]: Treating [name]/Extender: [name]: 3 Visit Information History Since Last Visit All ordered tests and consults were completed: No Patient Arrived: Ambulatory Added or deleted any medications: No Arrival Time: 10:53 Any new allergies or adverse reactions: No Accompanied By: self Had a fall or experienced change in No Transfer Assistance: None activities of daily living that may affect Patient Identification Verified: Yes risk of falls: Secondary Verification Process Completed: Yes Signs or symptoms of abuse/neglect since last visito No Patient Requires Transmission-Based Precautions: No Hospitalized since last visit: No Patient Has Alerts: Yes Implantable device outside of the clinic excluding No Patient Alerts: Patient on Blood Thinner cellular tissue based products placed in the center since last visit: Has Dressing in Place as Prescribed: Yes Pain Present Now: Yes Notes Per patient no one called him to schedule his arterial studies at [facility]. Provided patient their phone number to call them. Also having receptionist up front to call as well to see if they received the order last week. Electronic Signature(s) Signed: 07/02/2021 5:21:31 PM By: Ryan Stall RN, [name] Entered By: [name] on 07/02/2021 10:54:59 -------------------------------------------------------------------------------- Encounter Discharge Information Details Patient Name: Date of Service: [name], [name] 07/02/2021 10:30 A M Medical Record Number: [id number] Patient Account Number:  [id number] Date of Birth/Sex: Treating RN: 19-Dec-1955 (66 y.o. [name] Primary Care Sherese Heyward: [name] Other Clinician: Referring Artha Stavros: Treating Simone Tuckey/Extender: [name]: 3 Encounter Discharge Information Items Post Procedure Vitals Discharge Condition: Stable Temperature (F): 99 Ambulatory Status: Ambulatory Pulse (bpm): 85 Discharge Destination: Home Respiratory Rate (breaths/min): 20 Transportation: Private Auto Blood Pressure (mmHg): 195/106 Accompanied By: self Schedule Follow-up Appointment: Yes Clinical Summary of Care: Electronic Signature(s) Signed: 07/02/2021 5:21:31 PM By: Ryan Stall RN, [name] Entered By: [name] on 07/02/2021 11:25:19 -------------------------------------------------------------------------------- Lower Extremity Assessment Details Patient Name: Date of Service: [name], [name]. 07/02/2021 10:30 A M Medical Record Number: [id number] Patient Account Number: [id number] Date of Birth/Sex: Treating RN: [birth year] (66 y.o. [name] Primary Care Anni Hocevar: [name] Other Clinician: Referring Lulia Schriner: Treating Demetra Moya/Extender: [name]: 3 Edema Assessment Assessed: [name]: Yes] [name]: No] Edema: [Left: Ye] [Right: s] Calf Left: Right: Point of Measurement: 41 cm From Medial Instep 38 cm Ankle Left: Right: Point of Measurement: 15 cm From Medial Instep 32 cm Vascular Assessment Pulses: Dorsalis Pedis Palpable: [Left:Yes] Electronic Signature(s) Signed: 07/02/2021 5:21:31 PM By: Ryan Stall RN, [name] Entered By: [name] on 07/02/2021 10:55:32 -------------------------------------------------------------------------------- Multi Wound Chart Details Patient Name: Date of Service: [name], [name]. 07/02/2021 10:30 A M Medical Record Number: [id number] Patient Account Number: [id number] Date of Birth/Sex: Treating  RN: 1955/05/26 (66 y.o. [name] Primary Care Kylo Gavin: [name] Other Clinician: Referring Maddox Bratcher: Treating Lyriq Finerty/Extender: [name]: 3 Vital Signs Height(in): 75 Pulse(bpm): 85 Weight(lbs): 400 Blood Pressure(mmHg): 195/106 Body Mass Index(BMI): 50 Temperature(F): 99 Respiratory Rate(breaths/min): 20 Photos: [1:Left, Lateral Lower Leg] [N/A:N/A N/A] Wound Location: [1:Gradually Appeared] [N/A:N/A] Wounding Event: [1:Venous Leg Ulcer] [N/A:N/A] Primary Etiology: [1:Lymphedema] [N/A:N/A] Secondary Etiology: [1:Hypertension] [N/A:N/A] Comorbid History: [1:02/14/2021] [N/A:N/A] Date Acquired: [1:3] [N/A:N/A] Weeks  of Porter: [1:Open] [N/A:N/A] Wound Status: [1:No] [N/A:N/A] Wound Recurrence: [1:Yes] [N/A:N/A] Clustered Wound: [1:4] [N/A:N/A] Clustered Quantity: [1:7x6x0.2] [N/A:N/A] Measurements L x W x D (cm) [1:32.987] [N/A:N/A] A (cm) : rea [1:6.597] [N/A:N/A] Volume (cm) : [1:25.00%] [N/A:N/A] % Reduction in A rea: [1:25.00%] [N/A:N/A] % Reduction in Volume: [1:Full Thickness Without Exposed] [N/A:N/A] Classification: [1:Support Structures Medium] [N/A:N/A] Exudate A mount: [1:Serosanguineous] [N/A:N/A] Exudate Type: [1:red, brown] [N/A:N/A] Exudate Color: [1:Distinct, outline attached] [N/A:N/A] Wound Margin: [1:Medium (34-66%)] [N/A:N/A] Granulation A mount: [1:Red] [N/A:N/A] Granulation Quality: [1:Medium (34-66%)] [N/A:N/A] Necrotic A mount: [1:Fat Layer (Subcutaneous Tissue): Yes N/A] Exposed Structures: [1:Fascia: No Tendon: No Muscle: No Joint: No Bone: No Medium (34-66%)] [N/A:N/A] Epithelialization: [1:Debridement - Excisional] [N/A:N/A] Debridement: Pre-procedure Verification/Time Out 11:15 [N/A:N/A] Taken: [1:Other] [N/A:N/A] Pain Control: [1:Subcutaneous, Slough] [N/A:N/A] Tissue Debrided: [1:Skin/Subcutaneous Tissue] [N/A:N/A] Level: [1:25] [N/A:N/A] Debridement A (sq cm): [1:rea  Curette] [N/A:N/A] Instrument: [1:Minimum] [N/A:N/A] Bleeding: [1:Pressure] [N/A:N/A] Hemostasis A chieved: [1:0] [N/A:N/A] Procedural Pain: [1:0] [N/A:N/A] Post Procedural Pain: [1:Procedure was tolerated well] [N/A:N/A] Debridement Porter Response: [1:7x6x0.2] [N/A:N/A] Post Debridement Measurements L x W x D (cm) [1:6.597] [N/A:N/A] Post Debridement Volume: (cm) [1:Debridement] [N/A:N/A] Porter Notes Wound #1 (Lower Leg) Wound Laterality: Left, Lateral Cleanser Soap and Water Discharge Instruction: May shower and wash wound with dial antibacterial soap and water prior to dressing change. Wound Cleanser Discharge Instruction: Cleanse the wound with wound cleanser prior to applying a clean dressing using gauze sponges, not tissue or cotton balls. Peri-Wound Care Topical Keystone T opical Antibiotic Discharge Instruction: Use and apply daily to wound bed. Primary Dressing Hydrofera Blue Ready Foam, 4x5 in Discharge Instruction: Apply to wound bed as instructed Secondary Dressing Zetuvit Plus 4x8 in Discharge Instruction: Apply over primary dressing as directed. Secured With [employer], 4.5x3.1 (in/yd) Discharge Instruction: Secure with Kerlix as directed. 67M Medipore H Soft Cloth Surgical Tape, 4 x 10 (in/yd) Discharge Instruction: Secure with tape as directed. Compression Wrap Compression Stockings Add-Ons Electronic Signature(s) Signed: 07/02/2021 12:54:28 PM By: [name] DO Signed: 07/02/2021 5:21:31 PM By: Ryan Stall RN, [name] Entered By: [name] on 07/02/2021 12:51:09 -------------------------------------------------------------------------------- Multi-Disciplinary Care Plan Details Patient Name: Date of Service: [place], [name] 07/02/2021 10:30 A M Medical Record Number: [id number] Patient Account Number: [id number] Date of Birth/Sex: Treating RN: [birth year] (67 y.o. [name] Primary Care Amiyrah Lamere: [name] Other  Clinician: Referring Britni Driscoll: Treating Pranish Akhavan/Extender: [name]: 3 Active Inactive Necrotic Tissue Nursing Diagnoses: Knowledge deficit related to management of necrotic/devitalized tissue Goals: Necrotic/devitalized tissue will be minimized in the wound bed Date Initiated: 06/11/2021 Target Resolution Date: 07/10/2021 Goal Status: Active Interventions: Assess patient pain level pre-, during and post procedure and prior to discharge Provide education on necrotic tissue and debridement process Porter Activities: Apply topical anesthetic as ordered : 06/11/2021 Notes: Venous Leg Ulcer Nursing Diagnoses: Potential for venous Insuffiency (use before diagnosis confirmed) Goals: Patient will maintain optimal edema control Date Initiated: 06/11/2021 Target Resolution Date: 07/10/2021 Goal Status: Active Patient/caregiver will verbalize understanding of disease process and disease management Date Initiated: 06/11/2021 Target Resolution Date: 07/10/2021 Goal Status: Active Interventions: Assess peripheral edema status every visit. Compression as ordered Provide education on venous insufficiency Notes: Wound/Skin Impairment Nursing Diagnoses: Knowledge deficit related to ulceration/compromised skin integrity Goals: Patient/caregiver will verbalize understanding of skin care regimen Date Initiated: 06/11/2021 Target Resolution Date: 07/09/2021 Goal Status: Active Interventions: Assess patient/caregiver ability to obtain necessary supplies Assess patient/caregiver ability to perform ulcer/skin care regimen upon admission and as  needed Provide education on ulcer and skin care Porter Activities: Skin care regimen initiated : 06/11/2021 Topical wound management initiated : 06/11/2021 Notes: Electronic Signature(s) Signed: 07/02/2021 5:21:31 PM By: Ryan Stall RN, [name] Entered By: [name] on 07/02/2021  11:08:55 -------------------------------------------------------------------------------- Pain Assessment Details Patient Name: Date of Service: [name], [name]. 07/02/2021 10:30 A M Medical Record Number: [id number] Patient Account Number: [id number] Date of Birth/Sex: Treating RN: 1955-05-20 (66 y.o. [name] Primary Care Abdulai Blaylock: [name] Other Clinician: Referring Teighlor Korson: Treating Melinda Gwinner/Extender: [name]: 3 Active Problems Location of Pain Severity and Description of Pain Patient Has Paino Yes Site Locations Rate the pain. Current Pain Level: 7 Character of Pain Describe the Pain: Aching, Heavy, Sharp Pain Management and Medication Current Pain Management: Medication: No Cold Application: No Rest: No Massage: No Activity: No T.E.N.S.: No Heat Application: No Leg drop or elevation: No Is the Current Pain Management Adequate: Adequate How does your wound impact your activities of daily livingo Sleep: No Bathing: No Appetite: No Relationship With Others: No Bladder Continence: No Emotions: No Bowel Continence: No Work: No Toileting: No Drive: No Dressing: No Hobbies: No Psychologist, prison and probation services) Signed: 07/02/2021 5:21:31 PM By: Ryan Stall RN, [name] Entered By: [name] on 07/02/2021 10:55:20 -------------------------------------------------------------------------------- Patient/Caregiver Education Details Patient Name: Date of Service: Renetta Chalk 3/2/2023andnbsp10:30 A M Medical Record Number: [id number] Patient Account Number: [id number] Date of Birth/Gender: Treating RN: [birth year] (66 y.o. [name] Primary Care Physician: [name] Other Clinician: Referring Physician: Treating Physician/Extender: [name]: 3 Education Assessment Education Provided To: Patient Education Topics Provided Wound/Skin  Impairment: Handouts: Skin Care Do's and Dont's Methods: Explain/Verbal Responses: Reinforcements needed Electronic Signature(s) Signed: 07/02/2021 5:21:31 PM By: Ryan Stall RN, [name] Entered By: [name] on 07/02/2021 11:09:09 -------------------------------------------------------------------------------- Wound Assessment Details Patient Name: Date of Service: [name], [name]. 07/02/2021 10:30 A M Medical Record Number: [id number] Patient Account Number: [id number] Date of Birth/Sex: Treating RN: 17-Jul-1955 (66 y.o. [name], [place] Primary Care Jelesa Mangini: [name] Other Clinician: Referring Shreyan Hinz: Treating Jerriyah Louis/Extender: [name]: 3 Wound Status Wound Number: 1 Primary Etiology: Venous Leg Ulcer Wound Location: Left, Lateral Lower Leg Secondary Etiology: Lymphedema Wounding Event: Gradually Appeared Wound Status: Open Date Acquired: 02/14/2021 Comorbid History: Hypertension Weeks Of Porter: 3 Clustered Wound: Yes Photos Wound Measurements Length: (cm) 7 Width: (cm) 6 Depth: (cm) 0.2 Clustered Quantity: 4 Area: (cm) 32.987 Volume: (cm) 6.597 % Reduction in Area: 25% % Reduction in Volume: 25% Epithelialization: Medium (34-66%) Tunneling: No Undermining: No Wound Description Classification: Full Thickness Without Exposed Support Structures Wound Margin: Distinct, outline attached Exudate Amount: Medium Exudate Type: Serosanguineous Exudate Color: red, brown Foul Odor After Cleansing: No Slough/Fibrino Yes Wound Bed Granulation Amount: Medium (34-66%) Exposed Structure Granulation Quality: Red Fascia Exposed: No Necrotic Amount: Medium (34-66%) Fat Layer (Subcutaneous Tissue) Exposed: Yes Necrotic Quality: Adherent Slough Tendon Exposed: No Muscle Exposed: No Joint Exposed: No Bone Exposed: No Porter Notes Wound #1 (Lower Leg) Wound Laterality: Left, Lateral Cleanser Soap and  Water Discharge Instruction: May shower and wash wound with dial antibacterial soap and water prior to dressing change. Wound Cleanser Discharge Instruction: Cleanse the wound with wound cleanser prior to applying a clean dressing using gauze sponges, not tissue or cotton balls. Peri-Wound Care Topical Keystone T opical Antibiotic Discharge Instruction: Use and apply daily to wound bed. Primary Dressing Hydrofera Blue Ready Foam, 4x5  in Discharge Instruction: Apply to wound bed as instructed Secondary Dressing Zetuvit Plus 4x8 in Discharge Instruction: Apply over primary dressing as directed. Secured With [employer], 4.5x3.1 (in/yd) Discharge Instruction: Secure with Kerlix as directed. 66M Medipore H Soft Cloth Surgical T ape, 4 x 10 (in/yd) Discharge Instruction: Secure with tape as directed. Compression Wrap Compression Stockings Add-Ons Electronic Signature(s) Signed: 07/02/2021 4:17:36 PM By: Fonnie Mu RN Signed: 07/02/2021 5:21:31 PM By: Ryan Stall RN, [name] Entered By: Fonnie Mu on 07/02/2021 10:59:41 -------------------------------------------------------------------------------- Vitals Details Patient Name: Date of Service: [name], [place]. 07/02/2021 10:30 A M Medical Record Number: [id number] Patient Account Number: [id number] Date of Birth/Sex: Treating RN: 24-Apr-1956 (66 y.o. [name] Primary Care Ernest Popowski: [name] Other Clinician: Referring Brecklyn Galvis: Treating Timberlee Roblero/Extender: [name]: 3 Vital Signs Time Taken: 10:50 Temperature (F): 99 Height (in): 75 Pulse (bpm): 85 Weight (lbs): 400 Respiratory Rate (breaths/min): 20 Body Mass Index (BMI): 50 Blood Pressure (mmHg): 195/106 Reference Range: 80 - 120 mg / dl Notes Per patient has taken BP mediation at 0600. BP has decreased slightly since the first attempt at BP. Advised patient to closely monitor BP, educated  patient concerns of elevated BP, still elevated need to follow up with PCP. Kilo Eshelman made aware. Electronic Signature(s) Signed: 07/02/2021 5:21:31 PM By: Ryan Stall RN, [name] Entered By: [name] on 07/02/2021 11:02:29

## 2021-07-09 ENCOUNTER — Other Ambulatory Visit: Payer: Self-pay

## 2021-07-09 ENCOUNTER — Encounter (HOSPITAL_BASED_OUTPATIENT_CLINIC_OR_DEPARTMENT_OTHER): Payer: No Typology Code available for payment source | Admitting: General Surgery

## 2021-07-09 DIAGNOSIS — L97822 Non-pressure chronic ulcer of other part of left lower leg with fat layer exposed: Secondary | ICD-10-CM | POA: Diagnosis not present

## 2021-07-10 ENCOUNTER — Ambulatory Visit
Admission: RE | Admit: 2021-07-10 | Discharge: 2021-07-10 | Disposition: A | Discharge status: Home / Self Care | Payer: PRIVATE HEALTH INSURANCE | Source: Ambulatory Visit | Attending: Internal Medicine | Admitting: Internal Medicine

## 2021-07-10 DIAGNOSIS — S81802A Unspecified open wound, left lower leg, initial encounter: Secondary | ICD-10-CM

## 2021-07-10 NOTE — Progress Notes (Signed)
KASIEM, ESHELMAN ([id number]) Visit Report for 07/09/2021 Chief Complaint Document Details Patient Name: Date of Service: [place], [name] 07/09/2021 11:15 A M Medical Record Number: [id number] Patient Account Number: [id number] Date of Birth/Sex: Treating RN: 20-May-1955 (66 y.o. Hessie Diener Primary Care Provider: Chana Bode Other Clinician: Referring Provider: Treating Provider/Extender: Elmer Picker in Treatment: 4 Information Obtained from: Patient Chief Complaint Left lower extremity wounds Electronic Signature(s) Signed: 07/09/2021 12:11:43 PM By: Fredirick Maudlin MD FACS Entered By: Fredirick Maudlin on 07/09/2021 12:11:43 -------------------------------------------------------------------------------- Debridement Details Patient Name: Date of Service: Piedad Climes, SA [employer] 07/09/2021 11:15 A M Medical Record Number: [id number] Patient Account Number: [id number] Date of Birth/Sex: Treating RN: [birth year] (66 y.o. [name], Meta.Reding Primary Care Provider: Chana Bode Other Clinician: Referring Provider: Treating Provider/Extender: Elmer Picker in Treatment: 4 Debridement Performed for Assessment: Wound #1 Left,Lateral Lower Leg Performed By: Physician Fredirick Maudlin, MD Debridement Type: Debridement Severity of Tissue Pre Debridement: Fat layer exposed Level of Consciousness (Pre-procedure): Awake and Alert Pre-procedure Verification/Time Out Yes - 12:05 Taken: Start Time: 12:06 Pain Control: Other : benzocaine 20% T Area Debrided (L x W): otal 4 (cm) x 3 (cm) = 12 (cm) Tissue and other material debrided: Viable, Non-Viable, Slough, Subcutaneous, Skin: Dermis , Skin: Epidermis, Fibrin/Exudate, Slough Level: Skin/Subcutaneous Tissue Debridement Description: Excisional Instrument: Curette Bleeding: Minimum Hemostasis Achieved: Pressure End Time: 12:08 Procedural Pain: 0 Post Procedural Pain: 2 Response to  Treatment: Procedure was tolerated well Level of Consciousness (Post- Awake and Alert procedure): Post Debridement Measurements of Total Wound Length: (cm) 7 Width: (cm) 5 Depth: (cm) 0.1 Volume: (cm) 2.749 Character of Wound/Ulcer Post Debridement: Improved Severity of Tissue Post Debridement: Fat layer exposed Post Procedure Diagnosis Same as Pre-procedure Electronic Signature(s) Signed: 07/09/2021 5:31:26 PM By: Deon Pilling RN, [name] Signed: 07/10/2021 8:31:39 AM By: Fredirick Maudlin MD FACS Entered By: Deon Pilling on 07/09/2021 12:08:28 -------------------------------------------------------------------------------- HPI Details Patient Name: Date of Service: [name], SA [employer]. 07/09/2021 11:15 A M Medical Record Number: [id number] Patient Account Number: [id number] Date of Birth/Sex: Treating RN: 09-Dec-1955 (66 y.o. Hessie Diener Primary Care Provider: Chana Bode Other Clinician: Referring Provider: Treating Provider/Extender: Elmer Picker in Treatment: 4 History of Present Illness HPI Description: Admission 06/11/2021 Mr. Remond Doughton, [name] is a 66 year old male with a past medical history of chronic venous insufficiency and essential hypertension that presents to the clinic for a 64-month history of nonhealing ulcer to his left lower extremity. He thinks it started out as a bug bite and he scratched it. He has currently been keeping the area covered. He reports chronic pain to the wound bed. He has been given Keflex for this issue. He denies purulent drainage. He denies systemic signs of infection. 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 He is reported to not be a candidate for ablation due to morbid obesity and inability to wear compression. 2/16; patient presents for follow-up. He has no issues or complaints today. He states he tolerated the compression wrap well. He continues to  report chronic pain to the wound site. 2/23; patient presents for follow-up. He has no issues or complaints today. He reports improvement in his chronic pain to the wound site. He states that the [facility] antibiotics is arriving this week. He currently denies systemic signs of infection. 3/2; patient presents for follow-up. He has been using Hydrofera Blue and gentamicin  ointment to the wound bed without any issues. He reports an improvement in chronic pain to the wound bed. He obtained the [facility] antibiotic but has not started it yet. He brought this in today. 07/09/2021: Wounds continue to decrease in size. They are less painful and he is able to tolerate debridement better. He has been using Keystone antibiotic under [employer] with Kerlix and Coban wrapping. We had not been able to get formal arterial studies to try and apply greater compression, but he is scheduled to undergo this tomorrow. Hopefully that will allow [place] to use 3 or 4 layer compression which I think he really needs. No concern for infection. Electronic Signature(s) Signed: 07/09/2021 12:13:33 PM By: Fredirick Maudlin MD FACS Entered By: Fredirick Maudlin on 07/09/2021 12:13:32 -------------------------------------------------------------------------------- Physical Exam Details Patient Name: Date of Service: [name], Chase Picket 07/09/2021 11:15 A M Medical Record Number: [id number] Patient Account Number: [id number] Date of Birth/Sex: Treating RN: 1955-09-21 (66 y.o. Hessie Diener Primary Care Provider: Chana Bode Other Clinician: Referring Provider: Treating Provider/Extender: Elmer Picker in Treatment: 4 Constitutional He is hypertensive, but asymptomatic.. . . . No acute distress. Respiratory Normal work of breathing on room air. Notes 07/09/2021: Wound exammultiple open left lower extremity wounds with thick yellow slough. This is much easier to debride and came off with a #5 curette  to reveal granulation tissue under the surface. Electronic Signature(s) Signed: 07/09/2021 12:16:28 PM By: Fredirick Maudlin MD FACS Entered By: Fredirick Maudlin on 07/09/2021 12:16:28 -------------------------------------------------------------------------------- Physician Orders Details Patient Name: Date of Service: [name], Chase Picket 07/09/2021 11:15 A M Medical Record Number: [id number] Patient Account Number: [id number] Date of Birth/Sex: Treating RN: 1956/04/02 (66 y.o. Hessie Diener Primary Care Provider: Chana Bode Other Clinician: Referring Provider: Treating Provider/Extender: Elmer Picker in Treatment: 4 Verbal / Phone Orders: No Diagnosis Coding ICD-10 Coding Code Description [phone] Non-pressure chronic ulcer of other part of left lower leg with fat layer exposed I89.0 Lymphedema, not elsewhere classified I87.312 Chronic venous hypertension (idiopathic) with ulcer of left lower extremity I10 Essential (primary) hypertension Follow-up Appointments ppointment in 1 week. - Dr. Heber [place] and [place], Room 6 overflow 1030 am Thursday. Return A Bathing/ Shower/ Hygiene May shower with protection but do not get wound dressing(s) wet. Edema Control - Lymphedema / SCD / Other Elevate legs to the level of the heart or above for 30 minutes daily and/or when sitting, a frequency of: - 3-4 times throughout the day. Avoid standing for long periods of time. Exercise regularly Moisturize legs daily. - right leg every night before bed. Additional Orders / Instructions Follow Nutritious Diet Wound Treatment Wound #1 - Lower Leg Wound Laterality: Left, Lateral Cleanser: Soap and Water 1 x Per Day/30 Days Discharge Instructions: May shower and wash wound with dial antibacterial soap and water prior to dressing change. Cleanser: Wound Cleanser 1 x Per Day/30 Days Discharge Instructions: Cleanse the wound with wound cleanser prior to applying a clean  dressing using gauze sponges, not tissue or cotton balls. Topical: Keystone T opical Antibiotic 1 x Per Day/30 Days Discharge Instructions: Use and apply daily to wound bed. Prim Dressing: Hydrofera Blue Ready Foam, 4x5 in (Generic) 1 x Per Day/30 Days ary Discharge Instructions: Apply to wound bed as instructed Secondary Dressing: Zetuvit Plus 4x8 in (Generic) 1 x Per Day/30 Days Discharge Instructions: Apply over primary dressing as directed. Secured With: [employer], 4.5x3.1 (in/yd) (Generic) 1 x Per Day/30 Days Discharge Instructions:  Secure with Kerlix as directed. Secured With: 30M Medipore H Soft Cloth Surgical T ape, 4 x 10 (in/yd) (Generic) 1 x Per Day/30 Days Discharge Instructions: Secure with tape as directed. Electronic Signature(s) Signed: 07/10/2021 8:31:39 AM By: Fredirick Maudlin MD FACS Entered By: Fredirick Maudlin on 07/09/2021 12:17:04 -------------------------------------------------------------------------------- Problem List Details Patient Name: Date of Service: [name], SA [employer]. 07/09/2021 11:15 A M Medical Record Number: [id number] Patient Account Number: [id number] Date of Birth/Sex: Treating RN: 1955/07/19 (66 y.o. Hessie Diener Primary Care Provider: Chana Bode Other Clinician: Referring Provider: Treating Provider/Extender: Elmer Picker in Treatment: 4 Active Problems ICD-10 Encounter Code Description Active Date MDM Diagnosis [phone] Non-pressure chronic ulcer of other part of left lower leg with fat layer exposed2/01/2022 No Yes I89.0 Lymphedema, not elsewhere classified 06/11/2021 No Yes I87.312 Chronic venous hypertension (idiopathic) with ulcer of left lower extremity 06/11/2021 No Yes I10 Essential (primary) hypertension 06/11/2021 No Yes Inactive Problems Resolved Problems Electronic Signature(s) Signed: 07/09/2021 12:11:13 PM By: Fredirick Maudlin MD FACS Entered By: Fredirick Maudlin on 07/09/2021  12:11:13 -------------------------------------------------------------------------------- Progress Note Details Patient Name: Date of Service: [name], Chase Picket. 07/09/2021 11:15 A M Medical Record Number: [id number] Patient Account Number: [id number] Date of Birth/Sex: Treating RN: March 17, 1956 (66 y.o. Hessie Diener Primary Care Provider: Chana Bode Other Clinician: Referring Provider: Treating Provider/Extender: Elmer Picker in Treatment: 4 Subjective Chief Complaint Information obtained from Patient Left lower extremity wounds History of Present Illness (HPI) Admission 06/11/2021 Mr. Saben Stup, [name] is a 66 year old male with a past medical history of chronic venous insufficiency and essential hypertension that presents to the clinic for a 24-month history of nonhealing ulcer to his left lower extremity. He thinks it started out as a bug bite and he scratched it. He has currently been keeping the area covered. He reports chronic pain to the wound bed. He has been given Keflex for this issue. He denies purulent drainage. He denies systemic signs of infection. 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 He is reported to not be a candidate for ablation due to morbid obesity and inability to wear compression. 2/16; patient presents for follow-up. He has no issues or complaints today. He states he tolerated the compression wrap well. He continues to report chronic pain to the wound site. 2/23; patient presents for follow-up. He has no issues or complaints today. He reports improvement in his chronic pain to the wound site. He states that the [facility] antibiotics is arriving this week. He currently denies systemic signs of infection. 3/2; patient presents for follow-up. He has been using Hydrofera Blue and gentamicin ointment to the wound bed without any issues. He reports an improvement in  chronic pain to the wound bed. He obtained the [facility] antibiotic but has not started it yet. He brought this in today. 07/09/2021: Wounds continue to decrease in size. They are less painful and he is able to tolerate debridement better. He has been using Keystone antibiotic under [employer] with Kerlix and Coban wrapping. We had not been able to get formal arterial studies to try and apply greater compression, but he is scheduled to undergo this tomorrow. Hopefully that will allow [place] to use 3 or 4 layer compression which I think he really needs. No concern for infection. Patient History Information obtained from Patient. Family History Cancer - Father,Mother. Social History Never smoker, Marital Status - Married, Alcohol Use -  Moderate, Drug Use - No History, Caffeine Use - Never. Medical History Respiratory Denies history of Aspiration, Asthma, Chronic Obstructive Pulmonary Disease (COPD), Pneumothorax, Sleep Apnea, Tuberculosis Cardiovascular Patient has history of Hypertension Denies history of Angina, Arrhythmia, Congestive Heart Failure, Coronary Artery Disease, Deep Vein Thrombosis, Hypotension, Myocardial Infarction, Peripheral Arterial Disease, Peripheral Venous Disease, Phlebitis, Vasculitis Medical A Surgical History Notes nd Psychiatric Obesity >400lbs Objective Constitutional He is hypertensive, but asymptomatic.Marland Kitchen No acute distress. Vitals Time Taken: 11:53 AM, Height: 75 in, Weight: 400 lbs, BMI: 50, Temperature: 99.2 F, Pulse: 79 bpm, Respiratory Rate: 20 breaths/min, Blood Pressure: 182/100 mmHg. Respiratory Normal work of breathing on room air. General Notes: 07/09/2021: Wound examoomultiple open left lower extremity wounds with thick yellow slough. This is much easier to debride and came off with a #5 curette to reveal granulation tissue under the surface. Integumentary (Hair, Skin) Wound #1 status is Open. Original cause of wound was Gradually Appeared. The date  acquired was: 02/14/2021. The wound has been in treatment 4 weeks. The wound is located on the Left,Lateral Lower Leg. The wound measures 7cm length x 5cm width x 0.1cm depth; 27.489cm^2 area and 2.749cm^3 volume. There is Fat Layer (Subcutaneous Tissue) exposed. There is no tunneling or undermining noted. There is a medium amount of serosanguineous drainage noted. The wound margin is distinct with the outline attached to the wound base. There is large (67-100%) red granulation within the wound bed. There is a small (1-33%) amount of necrotic tissue within the wound bed including Adherent Slough. Assessment Active Problems ICD-10 Non-pressure chronic ulcer of other part of left lower leg with fat layer exposed Lymphedema, not elsewhere classified Chronic venous hypertension (idiopathic) with ulcer of left lower extremity Essential (primary) hypertension Procedures Wound #1 Pre-procedure diagnosis of Wound #1 is a Venous Leg Ulcer located on the Left,Lateral Lower Leg .Severity of Tissue Pre Debridement is: Fat layer exposed. There was a Excisional Skin/Subcutaneous Tissue Debridement with a total area of 12 sq cm performed by Fredirick Maudlin, MD. With the following instrument(s): Curette to remove Viable and Non-Viable tissue/material. Material removed includes Subcutaneous Tissue, Slough, Skin: Dermis, Skin: Epidermis, and Fibrin/Exudate after achieving pain control using Other (benzocaine 20%). A time out was conducted at 12:05, prior to the start of the procedure. A Minimum amount of bleeding was controlled with Pressure. The procedure was tolerated well with a pain level of 0 throughout and a pain level of 2 following the procedure. Post Debridement Measurements: 7cm length x 5cm width x 0.1cm depth; 2.749cm^3 volume. Character of Wound/Ulcer Post Debridement is improved. Severity of Tissue Post Debridement is: Fat layer exposed. Post procedure Diagnosis Wound #1: Same as  Pre-Procedure Plan Follow-up Appointments: Return Appointment in 1 week. - Dr. Heber  and [place], Room 6 overflow 1030 am Thursday. Bathing/ Shower/ Hygiene: May shower with protection but do not get wound dressing(s) wet. Edema Control - Lymphedema / SCD / Other: Elevate legs to the level of the heart or above for 30 minutes daily and/or when sitting, a frequency of: - 3-4 times throughout the day. Avoid standing for long periods of time. Exercise regularly Moisturize legs daily. - right leg every night before bed. Additional Orders / Instructions: Follow Nutritious Diet WOUND #1: - Lower Leg Wound Laterality: Left, Lateral Cleanser: Soap and Water 1 x Per Day/30 Days Discharge Instructions: May shower and wash wound with dial antibacterial soap and water prior to dressing change. Cleanser: Wound Cleanser 1 x Per Day/30 Days Discharge Instructions: Cleanse the wound with  wound cleanser prior to applying a clean dressing using gauze sponges, not tissue or cotton balls. Topical: Keystone T opical Antibiotic 1 x Per Day/30 Days Discharge Instructions: Use and apply daily to wound bed. Prim Dressing: Hydrofera Blue Ready Foam, 4x5 in (Generic) 1 x Per Day/30 Days ary Discharge Instructions: Apply to wound bed as instructed Secondary Dressing: Zetuvit Plus 4x8 in (Generic) 1 x Per Day/30 Days Discharge Instructions: Apply over primary dressing as directed. Secured With: [employer], 4.5x3.1 (in/yd) (Generic) 1 x Per Day/30 Days Discharge Instructions: Secure with Kerlix as directed. Secured With: 76M Medipore H Soft Cloth Surgical T ape, 4 x 10 (in/yd) (Generic) 1 x Per Day/30 Days Discharge Instructions: Secure with tape as directed. 07/09/2021:multiple open left lower extremity wounds with thick yellow slough. This is much easier to debride and came off with a #5 curette to reveal granulation tissue under the surface. Scheduled for formal ABI/TBI's tomorrow. Pending those results,  we can hopefully place him in greater compression. Continue Keystone antibiotic under Hydrofera Blue with Kerlix and Coban wrap until that time. Follow-up in 1 week Electronic Signature(s) Signed: 07/09/2021 12:18:22 PM By: Fredirick Maudlin MD FACS Entered By: Fredirick Maudlin on 07/09/2021 12:18:22 -------------------------------------------------------------------------------- HxROS Details Patient Name: Date of Service: [name], SA [employer]. 07/09/2021 11:15 A M Medical Record Number: [id number] Patient Account Number: [id number] Date of Birth/Sex: Treating RN: 04-11-1956 (66 y.o. Hessie Diener Primary Care Provider: Other Clinician: Chana Bode Referring Provider: Treating Provider/Extender: Elmer Picker in Treatment: 4 Information Obtained From Patient Respiratory Medical History: Negative for: Aspiration; Asthma; Chronic Obstructive Pulmonary Disease (COPD); Pneumothorax; Sleep Apnea; Tuberculosis Cardiovascular Medical History: Positive for: Hypertension Negative for: Angina; Arrhythmia; Congestive Heart Failure; Coronary Artery Disease; Deep Vein Thrombosis; Hypotension; Myocardial Infarction; Peripheral Arterial Disease; Peripheral Venous Disease; Phlebitis; Vasculitis Psychiatric Medical History: Past Medical History Notes: Obesity >400lbs Immunizations Pneumococcal Vaccine: Received Pneumococcal Vaccination: No Implantable Devices None Family and Social History Cancer: Yes - Father,Mother; Never smoker; Marital Status - Married; Alcohol Use: Moderate; Drug Use: No History; Caffeine Use: Never; Financial Concerns: No; Food, Clothing or Shelter Needs: No; Support System Lacking: No; Transportation Concerns: No Engineer, maintenance) Signed: 07/09/2021 5:31:26 PM By: Deon Pilling RN, [name] Signed: 07/10/2021 8:31:39 AM By: Fredirick Maudlin MD FACS Entered By: Fredirick Maudlin on 07/09/2021  12:13:41 -------------------------------------------------------------------------------- SuperBill Details Patient Name: Date of Service: [name], Chase Picket 07/09/2021 Medical Record Number: [id number] Patient Account Number: [id number] Date of Birth/Sex: Treating RN: [birth year] (66 y.o. Hessie Diener Primary Care Provider: Chana Bode Other Clinician: Referring Provider: Treating Provider/Extender: Elmer Picker in Treatment: 4 Diagnosis Coding ICD-10 Codes Code Description [phone] Non-pressure chronic ulcer of other part of left lower leg with fat layer exposed I89.0 Lymphedema, not elsewhere classified I87.312 Chronic venous hypertension (idiopathic) with ulcer of left lower extremity I10 Essential (primary) hypertension Facility Procedures CPT4 Code: [id number] IC L Description: [id number] - DEB SUBQ TISSUE 20 SQ CM/< ICD-10 Diagnosis Description D-10 Diagnosis Description 97.822 Non-pressure chronic ulcer of other part of left lower leg with fat layer ex Modifier: posed Quantity: 1 Physician Procedures : CPT4 Code Description Modifier [id number] - WC PHYS SUBQ TISS 20 SQ CM ICD-10 Diagnosis Description L97.822 Non-pressure chronic ulcer of other part of left lower leg with fat layer exposed Quantity: 1 Electronic Signature(s) Signed: 07/09/2021 12:18:33 PM By: Fredirick Maudlin MD FACS Entered By: Fredirick Maudlin on 07/09/2021 12:18:33

## 2021-07-13 NOTE — Progress Notes (Signed)
[name], [name] ([id number]) Visit Report for 07/09/2021 Arrival Information Details Patient Name: Date of Service: [name], [name] 07/09/2021 11:15 A M Medical Record Number: [id number] Patient Account Number: [id number] Date of Birth/Sex: Treating RN: [birth year] (66 y.o. [name], Millard.Loa Primary Care Aoi Kouns: [name] Other Clinician: Referring Shawnelle Spoerl: Treating Foday Cone/Extender: Guy Franco in Treatment: 4 Visit Information History Since Last Visit Added or deleted any medications: No Patient Arrived: Ambulatory Any new allergies or adverse reactions: No Arrival Time: 11:52 Had a fall or experienced change in No Accompanied By: self activities of daily living that may affect Transfer Assistance: None risk of falls: Patient Requires Transmission-Based Precautions: No Signs or symptoms of abuse/neglect since last visito No Patient Has Alerts: Yes Hospitalized since last visit: No Patient Alerts: Patient on Blood Thinner Implantable device outside of the clinic excluding No cellular tissue based products placed in the center since last visit: Has Dressing in Place as Prescribed: Yes Pain Present Now: No Electronic Signature(s) Signed: 07/13/2021 8:20:16 AM By: Karl Ito Entered By: Karl Ito on 07/09/2021 11:53:25 -------------------------------------------------------------------------------- Encounter Discharge Information Details Patient Name: Date of Service: [name], [name] 07/09/2021 11:15 A M Medical Record Number: [id number] Patient Account Number: [id number] Date of Birth/Sex: Treating RN: Jan 27, 1956 (66 y.o. [name] Primary Care Mairyn Lenahan: [name] Other Clinician: Referring Amberia Bayless: Treating Jadyn Barge/Extender: Guy Franco in Treatment: 4 Encounter Discharge Information Items Post Procedure Vitals Discharge Condition: Stable Temperature (F): 99.2 Ambulatory Status:  Ambulatory Pulse (bpm): 79 Discharge Destination: Home Respiratory Rate (breaths/min): 20 Transportation: Private Auto Blood Pressure (mmHg): 182/100 Accompanied By: self Schedule Follow-up Appointment: Yes Clinical Summary of Care: Electronic Signature(s) Signed: 07/09/2021 5:31:26 PM By: [name], [name] Entered By: Shawn Stall on 07/09/2021 12:14:28 -------------------------------------------------------------------------------- Lower Extremity Assessment Details Patient Name: Date of Service: [name], [name] 07/09/2021 11:15 A M Medical Record Number: [id number] Patient Account Number: [id number] Date of Birth/Sex: Treating RN: 1955-07-27 (66 y.o. [name] Primary Care Aliviah Spain: [name] Other Clinician: Referring Levorn Oleski: Treating [name]/Extender: Guy Franco in Treatment: 4 Edema Assessment Assessed: [name]: Yes] Franne Forts: No] Edema: [Left: Ye] [Right: s] Calf Left: Right: Point of Measurement: 41 cm From Medial Instep 56 cm Ankle Left: Right: Point of Measurement: 15 cm From Medial Instep 30 cm Vascular Assessment Pulses: Dorsalis Pedis Palpable: [Left:Yes] Electronic Signature(s) Signed: 07/09/2021 5:31:26 PM By: [name], [name] Entered By: Shawn Stall on 07/09/2021 12:02:16 -------------------------------------------------------------------------------- Multi Wound Chart Details Patient Name: Date of Service: [name], [name] 07/09/2021 11:15 A M Medical Record Number: [id number] Patient Account Number: [id number] Date of Birth/Sex: Treating RN: 12/19/1955 (66 y.o. [name], Millard.Loa Primary Care Teonia Yager: [name] Other Clinician: Referring [name]: Treating [name]/Extender: Guy Franco in Treatment: 4 Vital Signs Height(in): 75 Pulse(bpm): 79 Weight(lbs): 400 Blood Pressure(mmHg): 182/100 Body Mass Index(BMI): 50 Temperature(F): 99.2 Respiratory  Rate(breaths/min): 20 Photos: [1:Left, Lateral Lower Leg] [N/A:N/A N/A] Wound Location: [1:Gradually Appeared] [N/A:N/A] Wounding Event: [1:Venous Leg Ulcer] [N/A:N/A] Primary Etiology: [1:Lymphedema] [N/A:N/A] Secondary Etiology: [1:Hypertension] [N/A:N/A] Comorbid History: [1:02/14/2021] [N/A:N/A] Date Acquired: [1:4] [N/A:N/A] Weeks of Treatment: [1:Open] [N/A:N/A] Wound Status: [1:No] [N/A:N/A] Wound Recurrence: [1:Yes] [N/A:N/A] Clustered Wound: [1:4] [N/A:N/A] Clustered Quantity: [1:7x5x0.1] [N/A:N/A] Measurements L x W x D (cm) [1:27.489] [N/A:N/A] A (cm) : rea [1:2.749] [N/A:N/A] Volume (cm) : [1:37.50%] [N/A:N/A] % Reduction in A rea: [1:68.70%] [N/A:N/A] % Reduction in Volume: [1:Full Thickness Without Exposed] [N/A:N/A] Classification: [1:Support Structures Medium] [  N/A:N/A] Exudate A mount: [1:Serosanguineous] [N/A:N/A] Exudate Type: [1:red, brown] [N/A:N/A] Exudate Color: [1:Distinct, outline attached] [N/A:N/A] Wound Margin: [1:Large (67-100%)] [N/A:N/A] Granulation A mount: [1:Red] [N/A:N/A] Granulation Quality: [1:Small (1-33%)] [N/A:N/A] Necrotic A mount: [1:Fat Layer (Subcutaneous Tissue): Yes N/A] Exposed Structures: [1:Fascia: No Tendon: No Muscle: No Joint: No Bone: No Medium (34-66%)] [N/A:N/A] Epithelialization: [1:Debridement - Excisional] [N/A:N/A] Debridement: Pre-procedure Verification/Time Out 12:05 [N/A:N/A] Taken: [1:Other] [N/A:N/A] Pain Control: [1:Subcutaneous, Slough] [N/A:N/A] Tissue Debrided: [1:Skin/Subcutaneous Tissue] [N/A:N/A] Level: [1:12] [N/A:N/A] Debridement A (sq cm): [1:rea Curette] [N/A:N/A] Instrument: [1:Minimum] [N/A:N/A] Bleeding: [1:Pressure] [N/A:N/A] Hemostasis A chieved: [1:0] [N/A:N/A] Procedural Pain: [1:2] [N/A:N/A] Post Procedural Pain: [1:Procedure was tolerated well] [N/A:N/A] Debridement Treatment Response: [1:7x5x0.1] [N/A:N/A] Post Debridement Measurements L x W x D (cm) [1:2.749] [N/A:N/A] Post  Debridement Volume: (cm) [1:Debridement] [N/A:N/A] Treatment Notes Electronic Signature(s) Signed: 07/09/2021 12:11:22 PM By: Duanne Guess MD FACS Signed: 07/09/2021 5:31:26 PM By: [name], [name] Entered By: Duanne Guess on 07/09/2021 12:11:21 -------------------------------------------------------------------------------- Multi-Disciplinary Care Plan Details Patient Name: Date of Service: French Ana, [name] 07/09/2021 11:15 A M Medical Record Number: [id number] Patient Account Number: [id number] Date of Birth/Sex: Treating RN: [birth year] (66 y.o. [name] Primary Care Darly Fails: [name] Other Clinician: Referring Arnett Galindez: Treating Ettel Albergo/Extender: Guy Franco in Treatment: 4 Active Inactive Venous Leg Ulcer Nursing Diagnoses: Potential for venous Insuffiency (use before diagnosis confirmed) Goals: Patient will maintain optimal edema control Date Initiated: 06/11/2021 Target Resolution Date: 07/31/2021 Goal Status: Active Patient/caregiver will verbalize understanding of disease process and disease management Date Initiated: 06/11/2021 Target Resolution Date: 07/31/2021 Goal Status: Active Interventions: Assess peripheral edema status every visit. Compression as ordered Provide education on venous insufficiency Notes: Electronic Signature(s) Signed: 07/09/2021 5:31:26 PM By: [name], [name] Entered By: Shawn Stall on 07/09/2021 12:06:43 -------------------------------------------------------------------------------- Pain Assessment Details Patient Name: Date of Service: [name], [name] 07/09/2021 11:15 A M Medical Record Number: [id number] Patient Account Number: [id number] Date of Birth/Sex: Treating RN: Jan 01, 1956 (66 y.o. [name] Primary Care Meridith Romick: [name] Other Clinician: Referring Thorne Wirz: Treating Chidinma Clites/Extender: Guy Franco in Treatment: 4 Active  Problems Location of Pain Severity and Description of Pain Patient Has Paino No Site Locations Pain Management and Medication Current Pain Management: Electronic Signature(s) Signed: 07/09/2021 5:31:26 PM By: [name], [name] Signed: 07/13/2021 8:20:16 AM By: Karl Ito Entered By: Karl Ito on 07/09/2021 11:54:11 -------------------------------------------------------------------------------- Patient/Caregiver Education Details Patient Name: Date of Service: Renetta Chalk 3/9/2023andnbsp11:15 A M Medical Record Number: [id number] Patient Account Number: [id number] Date of Birth/Gender: Treating RN: 1955-08-04 (66 y.o. [name] Primary Care Physician: [name] Other Clinician: Referring Physician: Treating Physician/Extender: Guy Franco in Treatment: 4 Education Assessment Education Provided To: Patient Education Topics Provided Wound/Skin Impairment: Handouts: Skin Care Do's and Dont's Methods: Explain/Verbal Responses: Reinforcements needed Electronic Signature(s) Signed: 07/09/2021 5:31:26 PM By: [name], [name] Entered By: Shawn Stall on 07/09/2021 12:07:07 -------------------------------------------------------------------------------- Wound Assessment Details Patient Name: Date of Service: [name], [name] 07/09/2021 11:15 A M Medical Record Number: [id number] Patient Account Number: [id number] Date of Birth/Sex: Treating RN: 1956-04-04 (66 y.o. Lucious Groves Primary Care Vahan Wadsworth: [name] Other Clinician: Referring Latoiya Maradiaga: Treating Sammye Staff/Extender: Guy Franco in Treatment: 4 Wound Status Wound Number: 1 Primary Etiology: Venous Leg Ulcer Wound Location: Left, Lateral Lower Leg Secondary Etiology: Lymphedema Wounding Event: Gradually Appeared Wound Status: Open Date Acquired: 02/14/2021 Comorbid History: Hypertension Weeks Of Treatment:  4 Clustered Wound: Yes Photos Wound Measurements Length: (cm) 7 Width: (cm) 5 Depth: (cm) 0.1 Clustered Quantity: 4 Area: (cm) 27.489 Volume: (cm) 2.749 % Reduction in Area: 37.5% % Reduction in Volume: 68.7% Epithelialization: Medium (34-66%) Tunneling: No Undermining: No Wound Description Classification: Full Thickness Without Exposed Support Structures Wound Margin: Distinct, outline attached Exudate Amount: Medium Exudate Type: Serosanguineous Exudate Color: red, brown Foul Odor After Cleansing: No Slough/Fibrino Yes Wound Bed Granulation Amount: Large (67-100%) Exposed Structure Granulation Quality: Red Fascia Exposed: No Necrotic Amount: Small (1-33%) Fat Layer (Subcutaneous Tissue) Exposed: Yes Necrotic Quality: Adherent Slough Tendon Exposed: No Muscle Exposed: No Joint Exposed: No Bone Exposed: No Treatment Notes Wound #1 (Lower Leg) Wound Laterality: Left, Lateral Cleanser Soap and Water Discharge Instruction: May shower and wash wound with dial antibacterial soap and water prior to dressing change. Wound Cleanser Discharge Instruction: Cleanse the wound with wound cleanser prior to applying a clean dressing using gauze sponges, not tissue or cotton balls. Peri-Wound Care Topical Keystone T opical Antibiotic Discharge Instruction: Use and apply daily to wound bed. Primary Dressing Hydrofera Blue Ready Foam, 4x5 in Discharge Instruction: Apply to wound bed as instructed Secondary Dressing Zetuvit Plus 4x8 in Discharge Instruction: Apply over primary dressing as directed. Secured With [employer], 4.5x3.1 (in/yd) Discharge Instruction: Secure with Kerlix as directed. 22M Medipore H Soft Cloth Surgical T ape, 4 x 10 (in/yd) Discharge Instruction: Secure with tape as directed. Compression Wrap Compression Stockings Add-Ons Electronic Signature(s) Signed: 07/09/2021 5:18:04 PM By: Fonnie Mu RN Signed: 07/09/2021 5:31:26 PM By: [name], [name] Entered By: Shawn Stall on 07/09/2021 12:02:57 -------------------------------------------------------------------------------- Vitals Details Patient Name: Date of Service: [name], [place]. 07/09/2021 11:15 A M Medical Record Number: [id number] Patient Account Number: [id number] Date of Birth/Sex: Treating RN: 05-25-1955 (66 y.o. [name] Primary Care Meighan Treto: [name] Other Clinician: Referring Gissell Barra: Treating Oda Lansdowne/Extender: Guy Franco in Treatment: 4 Vital Signs Time Taken: 11:53 Temperature (F): 99.2 Height (in): 75 Pulse (bpm): 79 Weight (lbs): 400 Respiratory Rate (breaths/min): 20 Body Mass Index (BMI): 50 Blood Pressure (mmHg): 182/100 Reference Range: 80 - 120 mg / dl Electronic Signature(s) Signed: 07/13/2021 8:20:16 AM By: Karl Ito Entered By: Karl Ito on 07/09/2021 11:53:53

## 2021-07-16 ENCOUNTER — Encounter (HOSPITAL_BASED_OUTPATIENT_CLINIC_OR_DEPARTMENT_OTHER): Payer: No Typology Code available for payment source | Admitting: Internal Medicine

## 2021-07-16 ENCOUNTER — Other Ambulatory Visit: Payer: Self-pay

## 2021-07-16 DIAGNOSIS — L97822 Non-pressure chronic ulcer of other part of left lower leg with fat layer exposed: Secondary | ICD-10-CM

## 2021-07-16 NOTE — Progress Notes (Signed)
[name], [name] ([id number]) ?Visit Report for 07/16/2021 ?Chief Complaint Document Details ?Patient Name: Date of Service: ?RO [place], [name] 07/16/2021 10:30 A M ?Medical Record Number: [id number] ?Patient Account Number: [id number] ?Date of Birth/Sex: Treating RN: ?1955-11-14 (66 y.o. M) ?Primary Care Provider: Crosby Porter Other Clinician: ?Referring Provider: ?Treating Provider/Extender: [name] ?[name] ?Weeks in Treatment: 5 ?Information Obtained from: Patient ?Chief Complaint ?Left lower extremity wounds ?Electronic Signature(s) ?Signed: 07/16/2021 12:51:12 PM By: Ryan Corwin DO ?Entered By: [name] on 07/16/2021 12:46:39 ?-------------------------------------------------------------------------------- ?Debridement Details ?Patient Name: Date of Service: ?RO [place], [name] 07/16/2021 10:30 A M ?Medical Record Number: [id number] ?Patient Account Number: [id number] ?Date of Birth/Sex: Treating RN: ?December 27, 1955 (66 y.o. M) [name], Ryan ?Primary Care Provider: Crosby Porter Other Clinician: ?Referring Provider: ?Treating Provider/Extender: [name] ?[name] ?Weeks in Treatment: 5 ?Debridement Performed for Assessment: Wound #1 Left,Lateral Lower Leg ?Performed By: Physician Ryan Corwin, DO ?Debridement Type: Debridement ?Severity of Tissue Pre Debridement: Fat layer exposed ?Level of Consciousness (Pre-procedure): Awake and Alert ?Pre-procedure Verification/Time Out Yes - 11:40 ?Taken: ?Start Time: 11:41 ?Pain Control: Lidocaine 5% topical ointment ?T Area Debrided (L x W): ?otal 4 (cm) x 5 (cm) = 20 (cm?) ?Tissue and other material debrided: ?Viable, Non-Viable, Slough, Subcutaneous, Skin: Dermis , Skin: Epidermis, Fibrin/Exudate, Slough ?Level: Skin/Subcutaneous Tissue ?Debridement Description: Excisional ?Instrument: Curette ?Bleeding: Minimum ?Hemostasis Achieved: Pressure ?End Time: 11:46 ?Procedural Pain: 0 ?Post Procedural Pain: 1 ?Response to Treatment:  Procedure was tolerated well ?Level of Consciousness (Post- Awake and Alert ?procedure): ?Post Debridement Measurements of Total Wound ?Length: (cm) 7 ?Width: (cm) 5 ?Depth: (cm) 0.1 ?Volume: (cm?) 2.749 ?Character of Wound/Ulcer Post Debridement: Requires Further Debridement ?Severity of Tissue Post Debridement: Fat layer exposed ?Post Procedure Diagnosis ?Same as Pre-procedure ?Electronic Signature(s) ?Signed: 07/16/2021 12:51:12 PM By: Ryan Corwin DO ?Signed: 07/16/2021 6:07:05 PM By: Ryan Stall RN, [name] ?Entered By: [name] on 07/16/2021 11:46:46 ?-------------------------------------------------------------------------------- ?HPI Details ?Patient Name: Date of Service: ?RO [place], [name] 07/16/2021 10:30 A M ?Medical Record Number: [id number] ?Patient Account Number: [id number] ?Date of Birth/Sex: Treating RN: ?1955/05/28 (66 y.o. M) ?Primary Care Provider: Crosby Porter Other Clinician: ?Referring Provider: ?Treating Provider/Extender: [name] ?[name] ?Weeks in Treatment: 5 ?History of Present Illness ?HPI Description: Admission 06/11/2021 ?[name], [name] is a 66 year old male with a past medical history of chronic venous insufficiency and essential hypertension that presents to the clinic ?for a 49-month history of nonhealing ulcer to his left lower extremity. He thinks it started out as a bug bite and he scratched it. He has currently been keeping ?the area covered. He reports chronic pain to the wound bed. He has been given Keflex for this issue. He denies purulent drainage. He denies systemic signs ?of infection. 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 He is reported to not be a ?candidate for ablation due to morbid obesity and inability to wear compression. ?2/16; patient presents for follow-up. He has no issues or complaints today. He states he tolerated the compression wrap well. He continues to report  chronic ?pain to the wound site. ?2/23; patient presents for follow-up. He has no issues or complaints today. He reports improvement in his chronic pain to the wound site. He states that the ?Keystone antibiotics is arriving this week. He currently denies systemic signs of infection. ?3/2; patient presents for follow-up. He has been using Hydrofera Blue and gentamicin ointment to the wound  bed without any issues. He reports an ?improvement in chronic pain to the wound bed. He obtained the [facility] antibiotic but has not started it yet. He brought this in today. ?07/09/2021: Wounds continue to decrease in size. They are less painful and he is able to tolerate debridement better. He has been using Keystone antibiotic ?under Hydrofera Blue with Kerlix and Coban wrapping. We had not been able to get formal arterial studies to try and apply greater compression, but he is ?scheduled to undergo this tomorrow. Hopefully that will allow [place] to use 3 or 4 layer compression which I think he really needs. No concern for infection. ?3/16; patient presents for follow-up. He has been using Keystone antibiotics with Hydrofera Blue. He has no issues or complaints today. He was able to obtain ?his ABIs. Resting ABI was 1.27 and TBI was 0.72. It was noted to have normal arterial Doppler exam of the bilateral lower extremities. There is no evidence of ?lower extremity arterial occlusion. ?Electronic Signature(s) ?Signed: 07/16/2021 12:51:12 PM By: Ryan Corwin DO ?Entered By: [name] on 07/16/2021 12:48:01 ?-------------------------------------------------------------------------------- ?Physical Exam Details ?Patient Name: Date of Service: ?RO [place], [name] 07/16/2021 10:30 A M ?Medical Record Number: [id number] ?Patient Account Number: [id number] ?Date of Birth/Sex: Treating RN: ?April 08, 1956 (66 y.o. M) ?Primary Care Provider: Crosby Porter Other Clinician: ?Referring Provider: ?Treating Provider/Extender: [name] ?[name] ?Weeks in Treatment: 5 ?Constitutional ?respirations regular, non-labored and within target range for patient.Marland Kitchen ?Cardiovascular ?2+ dorsalis pedis/posterior tibialis pulses. ?Psychiatric ?pleasant and cooperative. ?Notes ?Multiple open wounds to the left lower extremity with granulation tissue and nonviable tissue. No signs of surrounding infection. 2+ pitting edema to the knee ?Electronic Signature(s) ?Signed: 07/16/2021 12:51:12 PM By: Ryan Corwin DO ?Entered By: [name] on 07/16/2021 12:49:12 ?-------------------------------------------------------------------------------- ?Physician Orders Details ?Patient Name: Date of Service: ?RO [place], [name] 07/16/2021 10:30 A M ?Medical Record Number: [id number] ?Patient Account Number: [id number] ?Date of Birth/Sex: Treating RN: ?02-17-1956 (67 y.o. 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 - Dr. Mikey Bussing and Yvonne Kendall, Room 8 ?Return A ?Bathing/ Shower/ Hygiene ?May shower with protection but do not get wound dressing(s) wet. ?Edema Control - Lymphedema / SCD / Other ?Elevate legs to the level of the heart or above for 30 minutes daily and/or when sitting, a frequency of: - 3-4 times throughout the day. ?Avoid standing for long periods of time. ?Exercise regularly ?Moisturize legs daily. - right leg every night before bed. ?Additional Orders / Instructions ?Follow Nutritious Diet ?Wound Treatment ?Wound #1 - Lower Leg Wound  Laterality: Left, Lateral ?Cleanser: Soap and Water 1 x Per Week/30 Days ?Discharge Instructions: May shower and wash wound with dial antibacterial soap and water prior to dressing change. ?Cleanser: Wound Cleanser 1 x Pe

## 2021-07-17 NOTE — Progress Notes (Signed)
[name], [name] ([id number]) ?Visit Report for 07/16/2021 ?Arrival Information Details ?Patient Name: Date of Service: ?[name], [place]. 07/16/2021 10:30 A M ?Medical Record Number: [id number] ?Patient Account Number: [id number] ?Date of Birth/Sex: Treating RN: ?Dec 28, 1955 (66 y.o. M) [name], [name] ?Primary Care Sula Fetterly: Chana Bode Other Clinician: ?Referring Mohini Heathcock: ?Treating Longino Trefz/Extender: Kalman Shan ?Chana Bode ?Weeks in Treatment: 5 ?Visit Information History Since Last Visit ?All ordered tests and consults were completed: Yes ?Patient Arrived: Ambulatory ?Added or deleted any medications: No ?Arrival Time: 11:24 ?Any new allergies or adverse reactions: No ?Accompanied By: self ?Had a fall or experienced change in No ?Transfer Assistance: None ?activities of daily living that may affect ?Patient Requires Transmission-Based Precautions: No ?risk of falls: ?Patient Has Alerts: Yes ?Signs or symptoms of abuse/neglect since last visito No ?Patient Alerts: Patient on Blood Thinner ?Hospitalized since last visit: No ?07/10/2021 TBI L: 065 ?Implantable device outside of the clinic excluding No ?07/10/21 ABI: rest L 1.34 ?cellular tissue based products placed in the center ?07/10/21 ABI: run L 0.92 ?since last visit: ?Has Dressing in Place as Prescribed: Yes ?Pain Present Now: No ?Electronic Signature(s) ?Signed: 07/16/2021 6:07:05 PM By: Deon Pilling RN, [name] ?Entered By: Deon Pilling on 07/16/2021 11:27:50 ?-------------------------------------------------------------------------------- ?Encounter Discharge Information Details ?Patient Name: Date of Service: ?[name], [place]. 07/16/2021 10:30 A M ?Medical Record Number: [id number] ?Patient Account Number: [id number] ?Date of Birth/Sex: Treating RN: ?21-Feb-1956 (66 y.o. M) [name], [name] ?Primary Care Khali Perella: Chana Bode Other Clinician: ?Referring Sera Hitsman: ?Treating Tayana Shankle/Extender: Kalman Shan ?Chana Bode ?Weeks in Treatment:  5 ?Encounter Discharge Information Items Post Procedure Vitals ?Discharge Condition: Stable ?Temperature (F): 97.3 ?Ambulatory Status: Ambulatory ?Pulse (bpm): 78 ?Discharge Destination: Home ?Respiratory Rate (breaths/min): 20 ?Transportation: Private Auto ?Blood Pressure (mmHg): 181/116 ?Accompanied By: self ?Schedule Follow-up Appointment: Yes ?Clinical Summary of Care: ?Electronic Signature(s) ?Signed: 07/16/2021 6:07:05 PM By: Deon Pilling RN, [name] ?Entered By: Deon Pilling on 07/16/2021 11:48:55 ?-------------------------------------------------------------------------------- ?Lower Extremity Assessment Details ?Patient Name: ?Date of Service: ?[name], [place] MUEL O. 07/16/2021 10:30 A M ?Medical Record Number: [id number] ?Patient Account Number: [id number] ?Date of Birth/Sex: ?Treating RN: ?November 14, 1955 (66 y.o. M) [name], [name] ?Primary Care Holleigh Crihfield: Chana Bode ?Other Clinician: ?Referring Giulianna Rocha: ?Treating Shawnice Tilmon/Extender: Kalman Shan ?Chana Bode ?Weeks in Treatment: 5 ?Edema Assessment ?Assessed: [Left: Yes] [Right: No] ?Edema: [Left: Ye] [Right: s] ?Calf ?Left: Right: ?Point of Measurement: 41 cm From Medial Instep 54 cm ?Ankle ?Left: Right: ?Point of Measurement: 15 cm From Medial Instep 30.5 cm ?Vascular Assessment ?Pulses: ?Dorsalis Pedis ?Palpable: [Left:Yes] ?Electronic Signature(s) ?Signed: 07/16/2021 6:07:05 PM By: Deon Pilling RN, [name] ?Entered By: Deon Pilling on 07/16/2021 11:30:02 ?-------------------------------------------------------------------------------- ?Multi Wound Chart Details ?Patient Name: ?Date of Service: ?[name], [place] MUEL O. 07/16/2021 10:30 A M ?Medical Record Number: [id number] ?Patient Account Number: [id number] ?Date of Birth/Sex: ?Treating RN: ?[birth year] (66 y.o. M) ?Primary Care Rajan Burgard: Chana Bode ?Other Clinician: ?Referring Petrea Fredenburg: ?Treating Hjalmar Ballengee/Extender: Kalman Shan ?Chana Bode ?Weeks in Treatment: 5 ?Vital Signs ?Height(in): 75 ?Pulse(bpm):  78 ?Weight(lbs): 400 ?Blood Pressure(mmHg): 181/116 ?Body Mass Index(BMI): 50 ?Temperature(??F): 97.3 ?Respiratory Rate(breaths/min): 20 ?Photos: [N/A:N/A] ?Left, Lateral Lower Leg N/A N/A ?Wound Location: ?Gradually Appeared N/A N/A ?Wounding Event: ?Venous Leg Ulcer N/A N/A ?Primary Etiology: ?Lymphedema N/A N/A ?Secondary Etiology: ?Hypertension N/A N/A ?Comorbid History: ?02/14/2021 N/A N/A ?Date Acquired: ?5 N/A N/A ?Weeks of Treatment: ?Open N/A N/A ?Wound Status: ?No N/A N/A ?Wound Recurrence: ?Yes N/A N/A ?Clustered Wound: ?5 N/A N/A ?Clustered Quantity: ?7x5x0.1 N/A N/A ?Measurements L x W x D (  cm) ?27.489 N/A N/A ?A (cm?) : ?rea ?2.749 N/A N/A ?Volume (cm?) : ?37.50% N/A N/A ?% Reduction in A rea: ?68.70% N/A N/A ?% Reduction in Volume: ?Full Thickness Without Exposed N/A N/A ?Classification: ?Support Structures ?Medium N/A N/A ?Exudate A mount: ?Serosanguineous N/A N/A ?Exudate Type: ?red, brown N/A N/A ?Exudate Color: ?Distinct, outline attached N/A N/A ?Wound Margin: ?Medium (34-66%) N/A N/A ?Granulation A mount: ?Red N/A N/A ?Granulation Quality: ?Medium (34-66%) N/A N/A ?Necrotic A mount: ?Fat Layer (Subcutaneous Tissue): Yes N/A N/A ?Exposed Structures: ?Fascia: No ?Tendon: No ?Muscle: No ?Joint: No ?Bone: No ?Medium (34-66%) N/A N/A ?Epithelialization: ?Debridement - Excisional N/A N/A ?Debridement: ?Pre-procedure Verification/Time Out 11:40 N/A N/A ?Taken: ?Lidocaine 5% topical ointment N/A N/A ?Pain Control: ?Subcutaneous, Slough N/A N/A ?Tissue Debrided: ?Skin/Subcutaneous Tissue N/A N/A ?Level: ?20 N/A N/A ?Debridement A (sq cm): ?rea ?Curette N/A N/A ?Instrument: ?Minimum N/A N/A ?Bleeding: ?Pressure N/A N/A ?Hemostasis A chieved: ?0 N/A N/A ?Procedural Pain: ?1 N/A N/A ?Post Procedural Pain: ?Procedure was tolerated well N/A N/A ?Debridement Treatment Response: ?7x5x0.1 N/A N/A ?Post Debridement Measurements L x ?W x D (cm) ?2.749 N/A N/A ?Post Debridement Volume: (cm?) ?Debridement N/A  N/A ?Procedures Performed: ?Treatment Notes ?Wound #1 (Lower Leg) Wound Laterality: Left, Lateral ?Cleanser ?Soap and Water ?Discharge Instruction: May shower and wash wound with dial antibacterial soap and water prior to dressing change. ?Wound Cleanser ?Discharge Instruction: Cleanse the wound with wound cleanser prior to applying a clean dressing using gauze sponges, not tissue or cotton balls. ?Peri-Wound Care ?Sween Lotion (Moisturizing lotion) ?Discharge Instruction: Apply moisturizing lotion as directed ?Topical ?Keystone T opical Antibiotic ?Discharge Instruction: Use and apply daily to wound bed. ?Primary Dressing ?Hydrofera Blue Classic Foam, 4x4 in ?Discharge Instruction: Moisten with saline prior to applying to wound bed ?Secondary Dressing ?Zetuvit Plus 4x8 in ?Discharge Instruction: Apply over primary dressing as directed. ?Secured With ?Compression Wrap ?ThreePress (3 layer compression wrap) ?Discharge Instruction: Apply three layer compression as directed. ?Compression Stockings ?Add-Ons ?Electronic Signature(s) ?Signed: 07/16/2021 12:51:12 PM By: Kalman Shan DO ?Entered By: Kalman Shan on 07/16/2021 12:46:29 ?-------------------------------------------------------------------------------- ?Multi-Disciplinary Care Plan Details ?Patient Name: ?Date of Service: ?Ryan Makakilo, [place] MUEL O. 07/16/2021 10:30 A M ?Medical Record Number: [id number] ?Patient Account Number: [id number] ?Date of Birth/Sex: ?Treating RN: ?1956-03-25 (66 y.o. M) [name], [name] ?Primary Care Brienna Bass: Chana Bode ?Other Clinician: ?Referring Alexcis Bicking: ?Treating Daviel Allegretto/Extender: Kalman Shan ?Chana Bode ?Weeks in Treatment: 5 ?Active Inactive ?Venous Leg Ulcer ?Nursing Diagnoses: ?Potential for venous Insuffiency (use before diagnosis confirmed) ?Goals: ?Patient will maintain optimal edema control ?Date Initiated: 06/11/2021 ?Target Resolution Date: 07/31/2021 ?Goal Status: Active ?Patient/caregiver will verbalize  understanding of disease process and disease management ?Date Initiated: 06/11/2021 ?Target Resolution Date: 07/31/2021 ?Goal Status: Active ?Interventions: ?Assess peripheral edema status every visit. ?Compression as ordered ?Pr

## 2021-07-24 ENCOUNTER — Other Ambulatory Visit: Payer: Self-pay

## 2021-07-24 ENCOUNTER — Encounter (HOSPITAL_BASED_OUTPATIENT_CLINIC_OR_DEPARTMENT_OTHER): Payer: No Typology Code available for payment source | Admitting: Internal Medicine

## 2021-07-24 DIAGNOSIS — L97822 Non-pressure chronic ulcer of other part of left lower leg with fat layer exposed: Secondary | ICD-10-CM | POA: Diagnosis not present

## 2021-07-24 NOTE — Progress Notes (Signed)
[name], [name] ([id number]) ?Visit Report for 07/24/2021 ?Arrival Information Details ?Patient Name: Date of Service: ?[name], [name] 07/24/2021 10:30 A M ?Medical Record Number: [id number] ?Patient Account Number: [id number] ?Date of Birth/Sex: Treating RN: ?02/16/1956 (66 y.o. M) [name], [name] ?Primary Care Dj Senteno: [name] Other Clinician: ?Referring Natallie Ravenscroft: ?Treating Ryn Peine/Extender: [name] ?[name] ?Weeks in Treatment: 6 ?Visit Information History Since Last Visit ?Added or deleted any medications: No ?Patient Arrived: Ambulatory ?Any new allergies or adverse reactions: No ?Arrival Time: 10:55 ?Had a fall or experienced change in No ?Accompanied By: self ?activities of daily living that may affect ?Transfer Assistance: None ?risk of falls: ?Patient Identification Verified: Yes ?Signs or symptoms of abuse/neglect since last visito No ?Secondary Verification Process Completed: Yes ?Hospitalized since last visit: No ?Patient Requires Transmission-Based Precautions: No ?Implantable device outside of the clinic excluding No ?Patient Has Alerts: Yes ?cellular tissue based products placed in the center ?Patient Alerts: Patient on Blood Thinner since last visit: ?07/10/2021 TBI L: 065 ?Has Dressing in Place as Prescribed: Yes ?07/10/21 ABI: rest L 1.34 ?Has Compression in Place as Prescribed: Yes ?07/10/21 ABI: run L 0.92 ?Pain Present Now: No ?Electronic Signature(s) ?Signed: 07/24/2021 12:50:46 PM By: [name], [name] ?Entered By: Shawn Stall on 07/24/2021 11:03:11 ?-------------------------------------------------------------------------------- ?Compression Therapy Details ?Patient Name: Date of Service: ?[name], [name] 07/24/2021 10:30 A M ?Medical Record Number: [id number] ?Patient Account Number: [id number] ?Date of Birth/Sex: Treating RN: ?05-Oct-1955 (66 y.o. M) [name], [name] ?Primary Care Kelicia Youtz: [name] Other Clinician: ?Referring Leo Weyandt: ?Treating Taggert Bozzi/Extender:  [name] ?[name] ?Weeks in Treatment: 6 ?Compression Therapy Performed for Wound Assessment: Wound #1 Left,Lateral Lower Leg ?Performed By: Clinician Shawn Stall, RN ?Compression Type: Three Layer ?Post Procedure Diagnosis ?Same as Pre-procedure ?Electronic Signature(s) ?Signed: 07/24/2021 12:50:46 PM By: [name], [name] ?Entered By: Shawn Stall on 07/24/2021 11:51:54 ?-------------------------------------------------------------------------------- ?Encounter Discharge Information Details ?Patient Name: ?Date of Service: ?[name], [name] 07/24/2021 10:30 A M ?Medical Record Number: [id number] ?Patient Account Number: [id number] ?Date of Birth/Sex: ?Treating RN: ?1955-05-12 (66 y.o. M) [name], [name] ?Primary Care Rondal Vandevelde: [name] ?Other Clinician: ?Referring Shanara Schnieders: ?Treating Deya Bigos/Extender: [name] ?[name] ?Weeks in Treatment: 6 ?Encounter Discharge Information Items Post Procedure Vitals ?Discharge Condition: Stable ?Temperature (F): 99 ?Ambulatory Status: Ambulatory ?Pulse (bpm): 79 ?Discharge Destination: Home ?Respiratory Rate (breaths/min): 20 ?Transportation: Private Auto ?Blood Pressure (mmHg): 177/104 ?Accompanied By: self ?Schedule Follow-up Appointment: Yes ?Clinical Summary of Care: ?Electronic Signature(s) ?Signed: 07/24/2021 12:50:46 PM By: [name], [name] ?Entered By: Shawn Stall on 07/24/2021 11:54:11 ?-------------------------------------------------------------------------------- ?Lower Extremity Assessment Details ?Patient Name: ?Date of Service: ?[name], [name] 07/24/2021 10:30 A M ?Medical Record Number: [id number] ?Patient Account Number: [id number] ?Date of Birth/Sex: ?Treating RN: ?12-19-1955 (66 y.o. M) [name], [name] ?Primary Care Payam Gribble: [name] ?Other Clinician: ?Referring Maxi Rodas: ?Treating Davion Meara/Extender: [name] ?[name] ?Weeks in Treatment: 6 ?Edema Assessment ?Assessed: [Left: Yes] [Right:  No] ?Edema: [Left: Ye] [Right: s] ?Calf ?Left: Right: ?Point of Measurement: 41 cm From Medial Instep 47 cm ?Ankle ?Left: Right: ?Point of Measurement: 15 cm From Medial Instep 28 cm ?Vascular Assessment ?Pulses: ?Dorsalis Pedis ?Palpable: [Left:Yes] ?Electronic Signature(s) ?Signed: 07/24/2021 12:50:46 PM By: [name], [name] ?Entered By: Shawn Stall on 07/24/2021 11:03:47 ?-------------------------------------------------------------------------------- ?Multi Wound Chart Details ?Patient Name: ?Date of Service: ?[name], [name] 07/24/2021 10:30 A M ?Medical Record Number: [id number] ?Patient Account Number: [id number] ?Date of Birth/Sex: ?Treating RN: ?Nov 20, 1955 (66 y.o. M) ?Primary Care Kavitha Lansdale:  [name] ?Other Clinician: ?Referring Reis Pienta: ?Treating Toma Arts/Extender: [name] ?[name] ?Weeks in Treatment: 6 ?Vital Signs ?Height(in): 75 ?Pulse(bpm): 79 ?Weight(lbs): 400 ?Blood Pressure(mmHg): 177/104 ?Body Mass Index(BMI): 50 ?Temperature(??F): 99 ?Respiratory Rate(breaths/min): 20 ?Photos: [N/A:N/A] ?Left, Lateral Lower Leg N/A N/A ?Wound Location: ?Gradually Appeared N/A N/A ?Wounding Event: ?Venous Leg Ulcer N/A N/A ?Primary Etiology: ?Lymphedema N/A N/A ?Secondary Etiology: ?Hypertension N/A N/A ?Comorbid History: ?02/14/2021 N/A N/A ?Date Acquired: ?6 N/A N/A ?Weeks of Treatment: ?Open N/A N/A ?Wound Status: ?No N/A N/A ?Wound Recurrence: ?Yes N/A N/A ?Clustered Wound: ?4 N/A N/A ?Clustered Quantity: ?7x4.1x0.2 N/A N/A ?Measurements L x W x D (cm) ?22.541 N/A N/A ?A (cm?) : ?rea ?4.508 N/A N/A ?Volume (cm?) : ?48.70% N/A N/A ?% Reduction in A rea: ?48.70% N/A N/A ?% Reduction in Volume: ?Full Thickness Without Exposed N/A N/A ?Classification: ?Support Structures ?Medium N/A N/A ?Exudate A mount: ?Serosanguineous N/A N/A ?Exudate Type: ?red, brown N/A N/A ?Exudate Color: ?Distinct, outline attached N/A N/A ?Wound Margin: ?Large (67-100%) N/A N/A ?Granulation A mount: ?Red N/A  N/A ?Granulation Quality: ?Small (1-33%) N/A N/A ?Necrotic A mount: ?Fat Layer (Subcutaneous Tissue): Yes N/A N/A ?Exposed Structures: ?Fascia: No ?Tendon: No ?Muscle: No ?Joint: No ?Bone: No ?Medium (34-66%) N/A N/A ?Epithelialization: ?Debridement - Excisional N/A N/A ?Debridement: ?Pre-procedure Verification/Time Out 11:35 N/A N/A ?Taken: ?Lidocaine 5% topical ointment N/A N/A ?Pain Control: ?Subcutaneous, Slough N/A N/A ?Tissue Debrided: ?Skin/Subcutaneous Tissue N/A N/A ?Level: ?15 N/A N/A ?Debridement A (sq cm): ?rea ?Curette N/A N/A ?Instrument: ?Minimum N/A N/A ?Bleeding: ?Pressure N/A N/A ?Hemostasis A chieved: ?0 N/A N/A ?Procedural Pain: ?0 N/A N/A ?Post Procedural Pain: ?Procedure was tolerated well N/A N/A ?Debridement Treatment Response: ?7x4.1x0.2 N/A N/A ?Post Debridement Measurements L x ?W x D (cm) ?4.508 N/A N/A ?Post Debridement Volume: (cm?) ?Compression Therapy N/A N/A ?Procedures Performed: ?Debridement ?Treatment Notes ?Wound #1 (Lower Leg) Wound Laterality: Left, Lateral ?Cleanser ?Soap and Water ?Discharge Instruction: May shower and wash wound with dial antibacterial soap and water prior to dressing change. ?Wound Cleanser ?Discharge Instruction: Cleanse the wound with wound cleanser prior to applying a clean dressing using gauze sponges, not tissue or cotton balls. ?Peri-Wound Care ?Sween Lotion (Moisturizing lotion) ?Discharge Instruction: Apply moisturizing lotion as directed ?Topical ?Keystone T opical Antibiotic ?Discharge Instruction: Use and apply daily to wound bed. ?Primary Dressing ?Cutimed Sorbact Swab ?Discharge Instruction: apply hydrogel over the sorbact. ?Secondary Dressing ?Zetuvit Plus 4x8 in ?Discharge Instruction: Apply over primary dressing as directed. ?Secured With ?Compression Wrap ?ThreePress (3 layer compression wrap) ?Discharge Instruction: Apply three layer compression as directed. ?Compression Stockings ?Add-Ons ?Electronic Signature(s) ?Signed: 07/24/2021  12:59:14 PM By: [name] DO ?Entered By: [name] on 07/24/2021 12:48:31 ?-------------------------------------------------------------------------------- ?Multi-Disciplinary Care Plan Details ?P

## 2021-07-24 NOTE — Progress Notes (Signed)
[name], [name] ([id number]) ?Visit Report for 07/24/2021 ?Chief Complaint Document Details ?Patient Name: Date of Service: ?RO [place], [name] 07/24/2021 10:30 A M ?Medical Record Number: [id number] ?Patient Account Number: [id number] ?Date of Birth/Sex: Treating RN: ?1955-09-03 (66 y.o. M) ?Primary Care Provider: [name] Other Clinician: ?Referring Provider: ?Treating Provider/Extender: [name] ?[name] ?Weeks in Treatment: 6 ?Information Obtained from: Patient ?Chief Complaint ?Left lower extremity wounds ?Electronic Signature(s) ?Signed: 07/24/2021 12:59:14 PM By: [name] DO ?Entered By: [name] on 07/24/2021 12:48:43 ?-------------------------------------------------------------------------------- ?Debridement Details ?Patient Name: Date of Service: ?RO [place], [name] 07/24/2021 10:30 A M ?Medical Record Number: [id number] ?Patient Account Number: [id number] ?Date of Birth/Sex: Treating RN: ?1955/12/06 (66 y.o. M) [name], [name] ?Primary Care Provider: [name] Other Clinician: ?Referring Provider: ?Treating Provider/Extender: [name] ?[name] ?Weeks in Treatment: 6 ?Debridement Performed for Assessment: Wound #1 Left,Lateral Lower Leg ?Performed By: Physician [name], DO ?Debridement Type: Debridement ?Severity of Tissue Pre Debridement: Fat layer exposed ?Level of Consciousness (Pre-procedure): Awake and Alert ?Pre-procedure Verification/Time Out Yes - 11:35 ?Taken: ?Start Time: 11:36 ?Pain Control: Lidocaine 5% topical ointment ?T Area Debrided (L x W): ?otal 5 (cm) x 3 (cm) = 15 (cm?) ?Tissue and other material debrided: ?Viable, Non-Viable, Slough, Subcutaneous, Skin: Dermis , Skin: Epidermis, Fibrin/Exudate, Slough ?Level: Skin/Subcutaneous Tissue ?Debridement Description: Excisional ?Instrument: Curette ?Bleeding: Minimum ?Hemostasis Achieved: Pressure ?End Time: 11:52 ?Procedural Pain: 0 ?Post Procedural Pain: 0 ?Response to Treatment:  Procedure was tolerated well ?Level of Consciousness (Post- Awake and Alert ?procedure): ?Post Debridement Measurements of Total Wound ?Length: (cm) 7 ?Width: (cm) 4.1 ?Depth: (cm) 0.2 ?Volume: (cm?) 4.508 ?Character of Wound/Ulcer Post Debridement: Improved ?Severity of Tissue Post Debridement: Fat layer exposed ?Post Procedure Diagnosis ?Same as Pre-procedure ?Electronic Signature(s) ?Signed: 07/24/2021 12:50:46 PM By: [name], [name] ?Signed: 07/24/2021 12:59:14 PM By: [name] DO ?Entered By: Shawn Stall on 07/24/2021 11:52:59 ?-------------------------------------------------------------------------------- ?HPI Details ?Patient Name: Date of Service: ?RO [place], [name] 07/24/2021 10:30 A M ?Medical Record Number: [id number] ?Patient Account Number: [id number] ?Date of Birth/Sex: Treating RN: ?23-Jan-1956 (66 y.o. M) ?Primary Care Provider: [name] Other Clinician: ?Referring Provider: ?Treating Provider/Extender: [name] ?[name] ?Weeks in Treatment: 6 ?History of Present Illness ?HPI Description: Admission 06/11/2021 ?Mr. Ryan Langhorst, [name] is a 66 year old male with a past medical history of chronic venous insufficiency and essential hypertension that presents to the clinic ?for a 47-month history of nonhealing ulcer to his left lower extremity. He thinks it started out as a bug bite and he scratched it. He has currently been keeping ?the area covered. He reports chronic pain to the wound bed. He has been given Keflex for this issue. He denies purulent drainage. He denies systemic signs ?of infection. 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 He is reported to not be a ?candidate for ablation due to morbid obesity and inability to wear compression. ?2/16; patient presents for follow-up. He has no issues or complaints today. He states he tolerated the compression wrap well. He continues to report chronic ?pain to  the wound site. ?2/23; patient presents for follow-up. He has no issues or complaints today. He reports improvement in his chronic pain to the wound site. He states that the ?[facility] antibiotics is arriving this week. He currently denies systemic signs of infection. ?3/2; patient presents for follow-up. He has been using Hydrofera Blue and gentamicin ointment to the wound bed without  any issues. He reports an ?improvement in chronic pain to the wound bed. He obtained the [facility] antibiotic but has not started it yet. He brought this in today. ?07/09/2021: Wounds continue to decrease in size. They are less painful and he is able to tolerate debridement better. He has been using [facility] antibiotic ?under Hydrofera Blue with Kerlix and Coban wrapping. We had not been able to get formal arterial studies to try and apply greater compression, but he is ?scheduled to undergo this tomorrow. Hopefully that will allow [place] to use 3 or 4 layer compression which I think he really needs. No concern for infection. ?3/16; patient presents for follow-up. He has been using [facility] antibiotics with Hydrofera Blue. He has no issues or complaints today. He was able to obtain ?his ABIs. Resting ABI was 1.27 and TBI was 0.72. It was noted to have normal arterial Doppler exam of the bilateral lower extremities. There is no evidence of ?lower extremity arterial occlusion. ?3/24; patient presents for follow-up. He has no issues or complaints today. He has been using [facility] antibiotics with Hydrofera Blue under 3 layer ?compression. The Hydrofera Blue was sticking to the wound bed when the wrap was removed today. Patient denies signs of infection. He reports improvement ?in his chronic pain to the wound bed ?Electronic Signature(s) ?Signed: 07/24/2021 12:59:14 PM By: [name] DO ?Entered By: [name] on 07/24/2021 12:49:56 ?-------------------------------------------------------------------------------- ?Physical Exam  Details ?Patient Name: Date of Service: ?RO [place], [name] 07/24/2021 10:30 A M ?Medical Record Number: [id number] ?Patient Account Number: [id number] ?Date of Birth/Sex: Treating RN: ?1955/06/17 (66 y.o. M) ?Primary Care Provider: [name] Other Clinician: ?Referring Provider: ?Treating Provider/Extender: [name] ?[name] ?Weeks in Treatment: 6 ?Constitutional ?respirations regular, non-labored and within target range for patient.Marland Kitchen ?Cardiovascular ?2+ dorsalis pedis/posterior tibialis pulses. ?Psychiatric ?pleasant and cooperative. ?Notes ?Multiple open wounds to the left lower extremity with granulation tissue and nonviable tissue. No signs of surrounding infection. Good edema control ?Electronic Signature(s) ?Signed: 07/24/2021 12:59:14 PM By: [name] DO ?Entered By: [name] on 07/24/2021 12:50:32 ?-------------------------------------------------------------------------------- ?Physician Orders Details ?Patient Name: Date of Service: ?RO [place], [name] 07/24/2021 10:30 A M ?Medical Record Number: [id number] ?Patient Account Number: [id number] ?Date of Birth/Sex: Treating RN: ?1955-07-23 (66 y.o. 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 - Friday 07/31/2021 1030 Dr. Mikey Bussing and Yvonne Kendall, Room overflow ?Return A ?Thursday 08/06/2021 1115 Dr. Mikey Bussing and Yvonne Kendall, Room 8 ?Thursday 08/13/2021 1030 Dr. Mikey Bussing and [place], Room 8 ?Thursday 08/20/2021 1030 Dr. Mikey Bussing and [place], Room  8 ?Bathing/ Shower/ Hygiene ?May shower with protection but do not get wound dressing(s) wet. ?Edema Control - Lymphedema / SCD / Other ?Elevate legs to the level of the heart or above for 30 minutes daily and/or

## 2021-07-26 ENCOUNTER — Other Ambulatory Visit: Payer: Self-pay | Admitting: Medical

## 2021-07-31 ENCOUNTER — Encounter (HOSPITAL_BASED_OUTPATIENT_CLINIC_OR_DEPARTMENT_OTHER): Payer: No Typology Code available for payment source | Admitting: Internal Medicine

## 2021-07-31 DIAGNOSIS — L97822 Non-pressure chronic ulcer of other part of left lower leg with fat layer exposed: Secondary | ICD-10-CM

## 2021-07-31 NOTE — Progress Notes (Signed)
PASCUAL, MANTEL ([id number]) ?Visit Report for 07/31/2021 ?Chief Complaint Document Details ?Patient Name: Date of Service: ?RO [place], [name] 07/31/2021 10:30 A M ?Medical Record Number: [id number] ?Patient Account Number: [id number] ?Date of Birth/Sex: Treating RN: ?1955/07/20 (66 y.o. M) [name], [name] ?Primary Care Provider: [name] Other Clinician: ?Referring Provider: ?Treating Provider/Extender: [name] ?[name] ?Weeks in Treatment: 7 ?Information Obtained from: Patient ?Chief Complaint ?Left lower extremity wounds ?Electronic Signature(s) ?Signed: 07/31/2021 11:50:40 AM By: [name] DO ?Entered By: [name] on 07/31/2021 11:46:44 ?-------------------------------------------------------------------------------- ?Debridement Details ?Patient Name: Date of Service: ?RO [place], [name] 07/31/2021 10:30 A M ?Medical Record Number: [id number] ?Patient Account Number: [id number] ?Date of Birth/Sex: Treating RN: ?[birth year] (66 y.o. M) [name], [name] ?Primary Care Provider: [name] Other Clinician: ?Referring Provider: ?Treating Provider/Extender: [name] ?[name] ?Weeks in Treatment: 7 ?Debridement Performed for Assessment: Wound #1 Left,Lateral Lower Leg ?Performed By: Physician [name], DO ?Debridement Type: Debridement ?Severity of Tissue Pre Debridement: Fat layer exposed ?Level of Consciousness (Pre-procedure): Awake and Alert ?Pre-procedure Verification/Time Out Yes - 11:20 ?Taken: ?Start Time: 11:21 ?Pain Control: Lidocaine 5% topical ointment ?T Area Debrided (L x W): ?otal 3.5 (cm) x 3 (cm) = 10.5 (cm?) ?Tissue and other material debrided: ?Viable, Non-Viable, Slough, Subcutaneous, Skin: Dermis , Skin: Epidermis, Fibrin/Exudate, Slough ?Level: Skin/Subcutaneous Tissue ?Debridement Description: Excisional ?Instrument: Curette ?Bleeding: Minimum ?Hemostasis Achieved: Pressure ?End Time: 11:30 ?Procedural Pain: 0 ?Post Procedural Pain: 0 ?Response  to Treatment: Procedure was tolerated well ?Level of Consciousness (Post- Awake and Alert ?procedure): ?Post Debridement Measurements of Total Wound ?Length: (cm) 5.5 ?Width: (cm) 3.5 ?Depth: (cm) 0.2 ?Volume: (cm?) 3.024 ?Character of Wound/Ulcer Post Debridement: Improved ?Severity of Tissue Post Debridement: Fat layer exposed ?Post Procedure Diagnosis ?Same as Pre-procedure ?Electronic Signature(s) ?Signed: 07/31/2021 11:50:40 AM By: [name] DO ?Signed: 07/31/2021 1:08:47 PM By: [name], [name] ?Entered By: Shawn Stall on 07/31/2021 11:30:37 ?-------------------------------------------------------------------------------- ?HPI Details ?Patient Name: Date of Service: ?RO [place], [name] 07/31/2021 10:30 A M ?Medical Record Number: [id number] ?Patient Account Number: [id number] ?Date of Birth/Sex: Treating RN: ?August 25, 1955 (66 y.o. M) [name], [name] ?Primary Care Provider: [name] Other Clinician: ?Referring Provider: ?Treating Provider/Extender: [name] ?[name] ?Weeks in Treatment: 7 ?History of Present Illness ?HPI Description: Admission 06/11/2021 ?Mr. Bolivar Koranda, [name] is a 66 year old male with a past medical history of chronic venous insufficiency and essential hypertension that presents to the clinic ?for a 17-month history of nonhealing ulcer to his left lower extremity. He thinks it started out as a bug bite and he scratched it. He has currently been keeping ?the area covered. He reports chronic pain to the wound bed. He has been given Keflex for this issue. He denies purulent drainage. He denies systemic signs ?of infection. 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 He is reported to not be a ?candidate for ablation due to morbid obesity and inability to wear compression. ?2/16; patient presents for follow-up. He has no issues or complaints today. He states he tolerated the compression wrap well. He  continues to report chronic ?pain to the wound site. ?2/23; patient presents for follow-up. He has no issues or complaints today. He reports improvement in his chronic pain to the wound site. He states that the ?[facility] antibiotics is arriving this week. He currently denies systemic signs of infection. ?3/2; patient presents for follow-up. He has been using Hydrofera Blue and gentamicin ointment to  the wound bed without any issues. He reports an ?improvement in chronic pain to the wound bed. He obtained the [facility] antibiotic but has not started it yet. He brought this in today. ?07/09/2021: Wounds continue to decrease in size. They are less painful and he is able to tolerate debridement better. He has been using [facility] antibiotic ?under Hydrofera Blue with Kerlix and Coban wrapping. We had not been able to get formal arterial studies to try and apply greater compression, but he is ?scheduled to undergo this tomorrow. Hopefully that will allow [place] to use 3 or 4 layer compression which I think he really needs. No concern for infection. ?3/16; patient presents for follow-up. He has been using [facility] antibiotics with Hydrofera Blue. He has no issues or complaints today. He was able to obtain ?his ABIs. Resting ABI was 1.27 and TBI was 0.72. It was noted to have normal arterial Doppler exam of the bilateral lower extremities. There is no evidence of ?lower extremity arterial occlusion. ?3/24; patient presents for follow-up. He has no issues or complaints today. He has been using [facility] antibiotics with Hydrofera Blue under 3 layer ?compression. The Hydrofera Blue was sticking to the wound bed when the wrap was removed today. Patient denies signs of infection. He reports improvement ?in his chronic pain to the wound bed ?3/31; patient presents for follow-up. He has tolerated the compression wrap well. He has no issues or complaints today. ?Electronic Signature(s) ?Signed: 07/31/2021 11:50:40 AM By: [name]  DO ?Entered By: [name] on 07/31/2021 11:47:14 ?-------------------------------------------------------------------------------- ?Physical Exam Details ?Patient Name: Date of Service: ?RO [place], [name] 07/31/2021 10:30 A M ?Medical Record Number: [id number] ?Patient Account Number: [id number] ?Date of Birth/Sex: Treating RN: ?November 13, 1955 (66 y.o. M) [name], [name] ?Primary Care Provider: [name] Other Clinician: ?Referring Provider: ?Treating Provider/Extender: [name] ?[name] ?Weeks in Treatment: 7 ?Constitutional ?respirations regular, non-labored and within target range for patient.Marland Kitchen ?Cardiovascular ?2+ dorsalis pedis/posterior tibialis pulses. ?Psychiatric ?pleasant and cooperative. ?Notes ?Only 2 open wounds remaining. These had granulation tissue and nonviable tissue present. No signs of surrounding infection. Decent edema control. ?Electronic Signature(s) ?Signed: 07/31/2021 11:50:40 AM By: [name] DO ?Entered By: [name] on 07/31/2021 11:48:21 ?-------------------------------------------------------------------------------- ?Physician Orders Details ?Patient Name: Date of Service: ?RO [place], [name] 07/31/2021 10:30 A M ?Medical Record Number: [id number] ?Patient Account Number: [id number] ?Date of Birth/Sex: Treating RN: ?April 17, 1956 (66 y.o. M) [name], [name] ?Primary Care Provider: [name] Other Clinician: ?Referring Provider: ?Treating Provider/Extender: [name] ?[name] ?Weeks in Treatment: 7 ?Verbal / Phone Orders: No ?Diagnosis Coding ?ICD-10 Coding ?Code Description ?P53.005 Non-pressure chronic ulcer of other part of left lower leg with fat layer exposed ?I89.0 Lymphedema, not elsewhere classified ?R10.211 Chronic venous hypertension (idiopathic) with ulcer of left lower extremity ?I10 Essential (primary) hypertension ?Follow-up Appointments ?ppointment in 1 week. - Thursday 08/06/2021 1115 Dr. Mikey Bussing and Yvonne Kendall, Room 8 ?Return  A ?Thursday 08/13/2021 1030 Dr. Leanord Hawking and [place], Room 8 ?Thursday 08/20/2021 1030 Dr. Mikey Bussing and [place], Room 8 ?Other: - Patient to purchase compression stockings. Bring in weekly to appt time. ?Bathing/ Shower/ Hygien

## 2021-07-31 NOTE — Progress Notes (Signed)
JACIEL, DIEM ([id number]) ?Visit Report for 07/31/2021 ?Arrival Information Details ?Patient Name: Date of Service: ?RO [place], [name] 07/31/2021 10:30 A M ?Medical Record Number: [id number] ?Patient Account Number: [id number] ?Date of Birth/Sex: Treating RN: ?06/19/1955 (66 y.o. Charlean Merl, Lauren ?Primary Care Kharter Brew: [name] Other Clinician: ?Referring Gaila Engebretsen: ?Treating Malek Skog/Extender: [name] ?[name] ?Weeks in Treatment: 7 ?Visit Information History Since Last Visit ?Added or deleted any medications: No ?Patient Arrived: Ambulatory ?Any new allergies or adverse reactions: No ?Arrival Time: 11:00 ?Had a fall or experienced change in No ?Accompanied By: self ?activities of daily living that may affect ?Transfer Assistance: None ?risk of falls: ?Patient Identification Verified: Yes ?Signs or symptoms of abuse/neglect since last visito No ?Secondary Verification Process Completed: Yes ?Hospitalized since last visit: No ?Patient Requires Transmission-Based Precautions: No ?Implantable device outside of the clinic excluding No ?Patient Has Alerts: Yes ?cellular tissue based products placed in the center ?Patient Alerts: Patient on Blood Thinner since last visit: ?07/10/2021 TBI L: 065 ?Has Dressing in Place as Prescribed: Yes ?07/10/21 ABI: rest L 1.34 ?Has Compression in Place as Prescribed: Yes ?07/10/21 ABI: run L 0.92 ?Pain Present Now: No ?Electronic Signature(s) ?Signed: 07/31/2021 12:49:17 PM By: Fonnie Mu RN ?Entered By: Fonnie Mu on 07/31/2021 11:00:24 ?-------------------------------------------------------------------------------- ?Compression Therapy Details ?Patient Name: Date of Service: ?RO [place], [name] 07/31/2021 10:30 A M ?Medical Record Number: [id number] ?Patient Account Number: [id number] ?Date of Birth/Sex: Treating RN: ?19-May-1955 (66 y.o. M) [name], [name] ?Primary Care Aleshka Corney: [name] Other Clinician: ?Referring Emerly Prak: ?Treating  Cashis Rill/Extender: [name] ?[name] ?Weeks in Treatment: 7 ?Compression Therapy Performed for Wound Assessment: Wound #1 Left,Lateral Lower Leg ?Performed By: Clinician Shawn Stall, RN ?Compression Type: Three Layer ?Post Procedure Diagnosis ?Same as Pre-procedure ?Electronic Signature(s) ?Signed: 07/31/2021 1:08:47 PM By: [name], [name] ?Entered By: Shawn Stall on 07/31/2021 11:27:58 ?-------------------------------------------------------------------------------- ?Encounter Discharge Information Details ?Patient Name: ?Date of Service: ?RO [place], [name] 07/31/2021 10:30 A M ?Medical Record Number: [id number] ?Patient Account Number: [id number] ?Date of Birth/Sex: ?Treating RN: ?1956/01/24 (66 y.o. M) [name], [name] ?Primary Care Enzio Buchler: [name] ?Other Clinician: ?Referring Nejla Reasor: ?Treating Kaicen Desena/Extender: [name] ?[name] ?Weeks in Treatment: 7 ?Encounter Discharge Information Items Post Procedure Vitals ?Discharge Condition: Stable ?Temperature (F): 99 ?Ambulatory Status: Ambulatory ?Pulse (bpm): 72 ?Discharge Destination: Home ?Respiratory Rate (breaths/min): 18 ?Transportation: Private Auto ?Blood Pressure (mmHg): 189/93 ?Accompanied By: self ?Schedule Follow-up Appointment: Yes ?Clinical Summary of Care: ?Electronic Signature(s) ?Signed: 07/31/2021 1:08:47 PM By: [name], [name] ?Entered By: Shawn Stall on 07/31/2021 11:35:26 ?-------------------------------------------------------------------------------- ?Lower Extremity Assessment Details ?Patient Name: ?Date of Service: ?RO [place], [name] 07/31/2021 10:30 A M ?Medical Record Number: [id number] ?Patient Account Number: [id number] ?Date of Birth/Sex: ?Treating RN: ?12-08-1955 (66 y.o. Charlean Merl, Lauren ?Primary Care Dajah Fischman: [name] ?Other Clinician: ?Referring Orli Degrave: ?Treating Cedarius Kersh/Extender: [name] ?[name] ?Weeks in Treatment: 7 ?Edema Assessment ?Assessed:  [Left: Yes] [Right: No] ?Edema: [Left: Ye] [Right: s] ?Calf ?Left: Right: ?Point of Measurement: 41 cm From Medial Instep 47 cm ?Ankle ?Left: Right: ?Point of Measurement: 15 cm From Medial Instep 28 cm ?Knee To Floor ?Left: Right: ?From Medial Instep 48 cm ?Vascular Assessment ?Pulses: ?Dorsalis Pedis ?Palpable: [Left:Yes] ?Posterior Tibial ?Palpable: [Left:Yes] ?Electronic Signature(s) ?Signed: 07/31/2021 12:49:17 PM By: Fonnie Mu RN ?Signed: 07/31/2021 1:08:47 PM By: [name], [name] ?Entered By: Shawn Stall on 07/31/2021 11:34:26 ?-------------------------------------------------------------------------------- ?Multi Wound Chart Details ?Patient Name: ?Date of Service: ?[name], [place] O. 07/31/2021 10:30 A  M ?Medical Record Number: [id number] ?Patient Account Number: [id number] ?Date of Birth/Sex: ?Treating RN: ?1955/08/08 (66 y.o. M) [name], [name] ?Primary Care Jmichael Gille: [name] ?Other Clinician: ?Referring Brycen Bean: ?Treating Lynnix Schoneman/Extender: [name] ?[name] ?Weeks in Treatment: 7 ?Vital Signs ?Height(in): 75 ?Pulse(bpm): 72 ?Weight(lbs): 400 ?Blood Pressure(mmHg): 189/93 ?Body Mass Index(BMI): 50 ?Temperature(??F): 99 ?Respiratory Rate(breaths/min): 17 ?Photos: [N/A:N/A] ?Left, Lateral Lower Leg N/A N/A ?Wound Location: ?Gradually Appeared N/A N/A ?Wounding Event: ?Venous Leg Ulcer N/A N/A ?Primary Etiology: ?Lymphedema N/A N/A ?Secondary Etiology: ?Hypertension N/A N/A ?Comorbid History: ?02/14/2021 N/A N/A ?Date Acquired: ?7 N/A N/A ?Weeks of Treatment: ?Open N/A N/A ?Wound Status: ?No N/A N/A ?Wound Recurrence: ?Yes N/A N/A ?Clustered Wound: ?3 N/A N/A ?Clustered Quantity: ?5.5x3.5x0.2 N/A N/A ?Measurements L x W x D (cm) ?15.119 N/A N/A ?A (cm?) : ?rea ?3.024 N/A N/A ?Volume (cm?) : ?65.60% N/A N/A ?% Reduction in A rea: ?65.60% N/A N/A ?% Reduction in Volume: ?Full Thickness Without Exposed N/A N/A ?Classification: ?Support Structures ?Medium N/A N/A ?Exudate A  mount: ?Serosanguineous N/A N/A ?Exudate Type: ?red, brown N/A N/A ?Exudate Color: ?Distinct, outline attached N/A N/A ?Wound Margin: ?Large (67-100%) N/A N/A ?Granulation A mount: ?Red N/A N/A ?Granulation Quality: ?Small (1-33%) N/A N/A ?Necrotic A mount: ?Fat Layer (Subcutaneous Tissue): Yes N/A N/A ?Exposed Structures: ?Fascia: No ?Tendon: No ?Muscle: No ?Joint: No ?Bone: No ?Medium (34-66%) N/A N/A ?Epithelialization: ?Debridement - Excisional N/A N/A ?Debridement: ?Pre-procedure Verification/Time Out 11:20 N/A N/A ?Taken: ?Lidocaine 5% topical ointment N/A N/A ?Pain Control: ?Subcutaneous, Slough N/A N/A ?Tissue Debrided: ?Skin/Subcutaneous Tissue N/A N/A ?Level: ?10.5 N/A N/A ?Debridement A (sq cm): ?rea ?Curette N/A N/A ?Instrument: ?Minimum N/A N/A ?Bleeding: ?Pressure N/A N/A ?Hemostasis A chieved: ?0 N/A N/A ?Procedural Pain: ?0 N/A N/A ?Post Procedural Pain: ?Procedure was tolerated well N/A N/A ?Debridement Treatment Response: ?5.5x3.5x0.2 N/A N/A ?Post Debridement Measurements L x ?W x D (cm) ?3.024 N/A N/A ?Post Debridement Volume: (cm?) ?Compression Therapy N/A N/A ?Procedures Performed: ?Debridement ?Treatment Notes ?Wound #1 (Lower Leg) Wound Laterality: Left, Lateral ?Cleanser ?Soap and Water ?Discharge Instruction: May shower and wash wound with dial antibacterial soap and water prior to dressing change. ?Wound Cleanser ?Discharge Instruction: Cleanse the wound with wound cleanser prior to applying a clean dressing using gauze sponges, not tissue or cotton balls. ?Peri-Wound Care ?Triamcinolone 15 (g) ?Discharge Instruction: Use triamcinolone 15 (g) as directed ?Sween Lotion (Moisturizing lotion) ?Discharge Instruction: Apply moisturizing lotion as directed ?Topical ?Keystone T opical Antibiotic ?Discharge Instruction: Use and apply daily to wound bed. ?Primary Dressing ?Cutimed Sorbact Swab ?Discharge Instruction: apply hydrogel over the sorbact. ?Secondary Dressing ?Zetuvit Plus 4x8  in ?Discharge Instruction: Apply over primary dressing as directed. ?Secured With ?Compression Wrap ?ThreePress (3 layer compression wrap) ?Discharge Instruction: Apply three layer compression as directed. ?Compression Stockings ?Add

## 2021-08-02 ENCOUNTER — Other Ambulatory Visit: Payer: Self-pay | Admitting: Medical

## 2021-08-06 ENCOUNTER — Encounter (HOSPITAL_BASED_OUTPATIENT_CLINIC_OR_DEPARTMENT_OTHER): Payer: No Typology Code available for payment source | Attending: Internal Medicine | Admitting: Internal Medicine

## 2021-08-06 DIAGNOSIS — I10 Essential (primary) hypertension: Secondary | ICD-10-CM | POA: Diagnosis not present

## 2021-08-06 DIAGNOSIS — L97822 Non-pressure chronic ulcer of other part of left lower leg with fat layer exposed: Secondary | ICD-10-CM | POA: Diagnosis present

## 2021-08-06 DIAGNOSIS — I890 Lymphedema, not elsewhere classified: Secondary | ICD-10-CM | POA: Diagnosis not present

## 2021-08-06 DIAGNOSIS — I87312 Chronic venous hypertension (idiopathic) with ulcer of left lower extremity: Secondary | ICD-10-CM | POA: Diagnosis not present

## 2021-08-06 NOTE — Progress Notes (Signed)
KAYMAN, STOLZENBURG ([id number]) ?Visit Report for 08/06/2021 ?Chief Complaint Document Details ?Patient Name: Date of Service: ?[name], [name] ?Medical Record Number: [id number] ?Patient Account Number: [id number] ?Date of Birth/Sex: Treating RN: ?01-11-1956 (66 y.o. Porter) [name], Ryan ?Primary Care Provider: Chana Bode Other Clinician: ?Referring Provider: ?Treating Provider/Extender: Kalman Shan ?Chana Bode ?Weeks in Treatment: 8 ?Information Obtained from: Patient ?Chief Complaint ?Left lower extremity wounds ?Electronic Signature(s) ?Signed: 08/06/2021 2:02:19 PM By: Kalman Shan DO ?Entered By: Kalman Shan on 08/06/2021 13:56:43 ?-------------------------------------------------------------------------------- ?Debridement Details ?Patient Name: Date of Service: ?[name], [name] ?Medical Record Number: [id number] ?Patient Account Number: [id number] ?Date of Birth/Sex: Treating RN: ?29-Dec-1955 (66 y.o. Porter) [name], Ryan ?Primary Care Provider: Chana Bode Other Clinician: ?Referring Provider: ?Treating Provider/Extender: Kalman Shan ?Chana Bode ?Weeks in Treatment: 8 ?Debridement Performed for Assessment: Wound #1 Left,Lateral Lower Leg ?Performed By: Physician Kalman Shan, DO ?Debridement Type: Debridement ?Severity of Tissue Pre Debridement: Fat layer exposed ?Level of Consciousness (Pre-procedure): Awake and Alert ?Pre-procedure Verification/Time Out Yes - 11:38 ?Taken: ?Start Time: 11:39 ?Pain Control: Lidocaine 5% topical ointment ?T Area Debrided (L x W): ?otal 3 (cm) x 2 (cm) = 6 (cm?) ?Tissue and other material debrided: ?Viable, Non-Viable, Slough, Subcutaneous, Skin: Dermis , Skin: Epidermis, Fibrin/Exudate, Slough ?Level: Skin/Subcutaneous Tissue ?Debridement Description: Excisional ?Instrument: Curette ?Bleeding: Minimum ?Hemostasis Achieved: Pressure ?End Time: 11:44 ?Procedural Pain: 0 ?Post Procedural Pain: 0 ?Response to  Treatment: Procedure was tolerated well ?Level of Consciousness (Post- Awake and Alert ?procedure): ?Post Debridement Measurements of Total Wound ?Length: (cm) 3 ?Width: (cm) 2 ?Depth: (cm) 0.2 ?Volume: (cm?) 0.942 ?Character of Wound/Ulcer Post Debridement: Improved ?Severity of Tissue Post Debridement: Fat layer exposed ?Post Procedure Diagnosis ?Same as Pre-procedure ?Electronic Signature(s) ?Signed: 08/06/2021 2:02:19 PM By: Kalman Shan DO ?Signed: 08/06/2021 5:34:01 PM By: Deon Pilling RN, [name] ?Entered By: Deon Pilling on 08/06/2021 11:45:15 ?-------------------------------------------------------------------------------- ?HPI Details ?Patient Name: Date of Service: ?[name], [name] ?Medical Record Number: [id number] ?Patient Account Number: [id number] ?Date of Birth/Sex: Treating RN: ?Oct 11, 1955 (66 y.o. Porter) [name], Ryan ?Primary Care Provider: Chana Bode Other Clinician: ?Referring Provider: ?Treating Provider/Extender: Kalman Shan ?Chana Bode ?Weeks in Treatment: 8 ?History of Present Illness ?HPI Description: Admission 06/11/2021 ?[name], [name] is a 66 year old male with a past medical history of chronic venous insufficiency and essential hypertension that presents to the clinic ?for a 97-month history of nonhealing ulcer to his left lower extremity. He thinks it started out as a bug bite and he scratched it. He has currently been keeping ?the area covered. He reports chronic pain to the wound bed. He has been given Keflex for this issue. He denies purulent drainage. He denies systemic signs ?of infection. 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 He is reported to not be a ?candidate for ablation due to morbid obesity and inability to wear compression. ?2/16; patient presents for follow-up. He has no issues or complaints today. He states he tolerated the compression wrap well. He continues to report  chronic ?pain to the wound site. ?2/23; patient presents for follow-up. He has no issues or complaints today. He reports improvement in his chronic pain to the wound site. He states that the ?Keystone antibiotics is arriving this week. He currently denies systemic signs of infection. ?3/2; patient presents for follow-up. He has been using Hydrofera Blue and gentamicin ointment to  the wound bed without any issues. He reports an ?improvement in chronic pain to the wound bed. He obtained the [facility] antibiotic but has not started it yet. He brought this in today. ?07/09/2021: Wounds continue to decrease in size. They are less painful and he is able to tolerate debridement better. He has been using Keystone antibiotic ?under Hydrofera Blue with Kerlix and Coban wrapping. We had not been able to get formal arterial studies to try and apply greater compression, but he is ?scheduled to undergo this tomorrow. Hopefully that will allow [place] to use 3 or 4 layer compression which I think he really needs. No concern for infection. ?3/16; patient presents for follow-up. He has been using Keystone antibiotics with Hydrofera Blue. He has no issues or complaints today. He was able to obtain ?his ABIs. Resting ABI was 1.27 and TBI was 0.72. It was noted to have normal arterial Doppler exam of the bilateral lower extremities. There is no evidence of ?lower extremity arterial occlusion. ?3/24; patient presents for follow-up. He has no issues or complaints today. He has been using Keystone antibiotics with Hydrofera Blue under 3 layer ?compression. The Hydrofera Blue was sticking to the wound bed when the wrap was removed today. Patient denies signs of infection. He reports improvement ?in his chronic pain to the wound bed ?3/31; patient presents for follow-up. He has tolerated the compression wrap well. He has no issues or complaints today. ?4/6; patient presents for follow-up. He has no issues or complaints today. He is doing well with  Keystone antibiotics and Sorbact under 3 layer compression. ?Electronic Signature(s) ?Signed: 08/06/2021 2:02:19 PM By: Kalman Shan DO ?Entered By: Kalman Shan on 08/06/2021 13:58:17 ?-------------------------------------------------------------------------------- ?Physical Exam Details ?Patient Name: Date of Service: ?[name], [name] ?Medical Record Number: [id number] ?Patient Account Number: [id number] ?Date of Birth/Sex: Treating RN: ?Apr 19, 1956 (66 y.o. Porter) [name], Ryan ?Primary Care Provider: Chana Bode Other Clinician: ?Referring Provider: ?Treating Provider/Extender: Kalman Shan ?Chana Bode ?Weeks in Treatment: 8 ?Constitutional ?respirations regular, non-labored and within target range for patient.Marland Kitchen ?Cardiovascular ?2+ dorsalis pedis/posterior tibialis pulses. ?Psychiatric ?pleasant and cooperative. ?Notes ?Left lower extremity: 2 open wounds remaining. There is granulation tissue and nonviable tissue present. No signs of surrounding infection. Good edema ?control. ?Electronic Signature(s) ?Signed: 08/06/2021 2:02:19 PM By: Kalman Shan DO ?Entered By: Kalman Shan on 08/06/2021 13:59:06 ?-------------------------------------------------------------------------------- ?Physician Orders Details ?Patient Name: Date of Service: ?Ryan Bell, [name] ?Medical Record Number: [id number] ?Patient Account Number: [id number] ?Date of Birth/Sex: Treating RN: ?Nov 16, 1955 (66 y.o. Porter) [name], Ryan ?Primary Care Provider: Chana Bode Other Clinician: ?Referring Provider: ?Treating Provider/Extender: Kalman Shan ?Chana Bode ?Weeks in Treatment: 8 ?Verbal / Phone Orders: No ?Diagnosis Coding ?ICD-10 Coding ?Code Description ?[id number] Non-pressure chronic ulcer of other part of left lower leg with fat layer exposed ?I89.0 Lymphedema, not elsewhere classified ?[id number] Chronic venous hypertension (idiopathic) with ulcer of left lower extremity ?I10  Essential (primary) hypertension ?Follow-up Appointments ?ppointment in 1 week. - Thursday 08/13/2021 1030 Dr. Dellia Nims and Tammi Klippel, Room 8 ?Return A ?Thursday 08/20/2021 1030 Dr. Heber [place] and [place], Room 8 ?Other: - Br

## 2021-08-06 NOTE — Progress Notes (Signed)
ORTON, CAPELL ([id number]) ?Visit Report for 08/06/2021 ?Arrival Information Details ?Patient Name: Date of Service: ?RO [place], [name] 08/06/2021 11:15 A M ?Medical Record Number: [id number] ?Patient Account Number: [id number] ?Date of Birth/Sex: Treating RN: ?Mar 10, 1956 (66 y.o. M) [name], [name] ?Primary Care Jaysean Manville: [name] Other Clinician: ?Referring Amadi Yoshino: ?Treating Nyron Mozer/Extender: [name] ?[name] ?Weeks in Treatment: 8 ?Visit Information History Since Last Visit ?Added or deleted any medications: No ?Patient Arrived: Ambulatory ?Any new allergies or adverse reactions: No ?Arrival Time: 11:29 ?Had a fall or experienced change in No ?Accompanied By: self ?activities of daily living that may affect ?Transfer Assistance: None ?risk of falls: ?Patient Requires Transmission-Based Precautions: No ?Signs or symptoms of abuse/neglect since last visito No ?Patient Has Alerts: Yes ?Hospitalized since last visit: No ?Patient Alerts: Patient on Blood Thinner ?Implantable device outside of the clinic excluding No ?07/10/2021 TBI L: 065 ?cellular tissue based products placed in the center ?07/10/21 ABI: rest L 1.34 ?since last visit: ?07/10/21 ABI: run L 0.92 ?Has Dressing in Place as Prescribed: Yes ?Has Compression in Place as Prescribed: Yes ?Pain Present Now: No ?Electronic Signature(s) ?Signed: 08/06/2021 5:34:01 PM By: [name], [name] ?Entered By: Shawn Stall on 08/06/2021 11:29:56 ?-------------------------------------------------------------------------------- ?Compression Therapy Details ?Patient Name: Date of Service: ?RO [place], [name] 08/06/2021 11:15 A M ?Medical Record Number: [id number] ?Patient Account Number: [id number] ?Date of Birth/Sex: Treating RN: ?06/16/1955 (66 y.o. M) [name], [name] ?Primary Care Destaney Sarkis: [name] Other Clinician: ?Referring Der Gagliano: ?Treating Jsoeph Podesta/Extender: [name] ?[name] ?Weeks in Treatment: 8 ?Compression Therapy Performed  for Wound Assessment: Wound #1 Left,Lateral Lower Leg ?Performed By: Clinician Shawn Stall, RN ?Compression Type: Three Layer ?Post Procedure Diagnosis ?Same as Pre-procedure ?Electronic Signature(s) ?Signed: 08/06/2021 5:34:01 PM By: [name], [name] ?Entered By: Shawn Stall on 08/06/2021 11:43:05 ?-------------------------------------------------------------------------------- ?Encounter Discharge Information Details ?Patient Name: ?Date of Service: ?RO [place], [name] 08/06/2021 11:15 A M ?Medical Record Number: [id number] ?Patient Account Number: [id number] ?Date of Birth/Sex: ?Treating RN: ?[birth year] (66 y.o. M) [name], [name] ?Primary Care Kenn Rekowski: [name] ?Other Clinician: ?Referring Jonelle Bann: ?Treating Aidynn Polendo/Extender: [name] ?[name] ?Weeks in Treatment: 8 ?Encounter Discharge Information Items Post Procedure Vitals ?Discharge Condition: Stable ?Temperature (F): 98.7 ?Ambulatory Status: Ambulatory ?Pulse (bpm): 87 ?Discharge Destination: Home ?Respiratory Rate (breaths/min): 20 ?Transportation: Private Auto ?Blood Pressure (mmHg): 158/99 ?Accompanied By: self ?Schedule Follow-up Appointment: Yes ?Clinical Summary of Care: ?Electronic Signature(s) ?Signed: 08/06/2021 5:34:01 PM By: [name], [name] ?Entered By: Shawn Stall on 08/06/2021 11:48:45 ?-------------------------------------------------------------------------------- ?Lower Extremity Assessment Details ?Patient Name: ?Date of Service: ?RO [place], [name] 08/06/2021 11:15 A M ?Medical Record Number: [id number] ?Patient Account Number: [id number] ?Date of Birth/Sex: ?Treating RN: ?Apr 16, 1956 (66 y.o. M) [name], [name] ?Primary Care Hendryx Ricke: [name] ?Other Clinician: ?Referring Sie Formisano: ?Treating Gerene Nedd/Extender: [name] ?[name] ?Weeks in Treatment: 8 ?Edema Assessment ?Assessed: [Left: No] [Right: No] ?Edema: [Left: Ye] [Right: s] ?Calf ?Left: Right: ?Point of Measurement: 41 cm From Medial  Instep 48.2 cm ?Ankle ?Left: Right: ?Point of Measurement: 15 cm From Medial Instep 28.5 cm ?Vascular Assessment ?Pulses: ?Dorsalis Pedis ?Palpable: [Left:Yes] ?Electronic Signature(s) ?Signed: 08/06/2021 5:34:01 PM By: [name], [name] ?Entered By: Shawn Stall on 08/06/2021 11:25:18 ?-------------------------------------------------------------------------------- ?Multi Wound Chart Details ?Patient Name: ?Date of Service: ?RO [place], [name] 08/06/2021 11:15 A M ?Medical Record Number: [id number] ?Patient Account Number: [id number] ?Date of Birth/Sex: ?Treating RN: ?11-21-1955 (66 y.o. M) [name], [name] ?Primary Care Duyen Beckom: [name] ?Other Clinician: ?Referring Alayah Knouff: ?Treating  Persephone Schriever/Extender: [name] ?[name] ?Weeks in Treatment: 8 ?Vital Signs ?Height(in): 75 ?Pulse(bpm): 79 ?Weight(lbs): 400 ?Blood Pressure(mmHg): 158/99 ?Body Mass Index(BMI): 50 ?Temperature(??F): 98.7 ?Respiratory Rate(breaths/min): 18 ?Photos: [N/A:N/A] ?Left, Lateral Lower Leg N/A N/A ?Wound Location: ?Gradually Appeared N/A N/A ?Wounding Event: ?Venous Leg Ulcer N/A N/A ?Primary Etiology: ?Lymphedema N/A N/A ?Secondary Etiology: ?Hypertension N/A N/A ?Comorbid History: ?02/14/2021 N/A N/A ?Date Acquired: ?8 N/A N/A ?Weeks of Treatment: ?Open N/A N/A ?Wound Status: ?No N/A N/A ?Wound Recurrence: ?Yes N/A N/A ?Clustered Wound: ?3 N/A N/A ?Clustered Quantity: ?5.5x3x0.2 N/A N/A ?Measurements L x W x D (cm) ?12.959 N/A N/A ?A (cm?) : ?rea ?2.592 N/A N/A ?Volume (cm?) : ?70.50% N/A N/A ?% Reduction in A rea: ?70.50% N/A N/A ?% Reduction in Volume: ?Full Thickness Without Exposed N/A N/A ?Classification: ?Support Structures ?Medium N/A N/A ?Exudate A mount: ?Serosanguineous N/A N/A ?Exudate Type: ?red, brown N/A N/A ?Exudate Color: ?Distinct, outline attached N/A N/A ?Wound Margin: ?Medium (34-66%) N/A N/A ?Granulation A mount: ?Red N/A N/A ?Granulation Quality: ?Medium (34-66%) N/A N/A ?Necrotic A mount: ?Eschar,  Adherent Slough N/A N/A ?Necrotic Tissue: ?Fat Layer (Subcutaneous Tissue): Yes N/A N/A ?Exposed Structures: ?Fascia: No ?Tendon: No ?Muscle: No ?Joint: No ?Bone: No ?Medium (34-66%) N/A N/A ?Epithelialization: ?Debridement - Excisional N/A N/A ?Debridement: ?Pre-procedure Verification/Time Out 11:38 N/A N/A ?Taken: ?Lidocaine 5% topical ointment N/A N/A ?Pain Control: ?Subcutaneous, Slough N/A N/A ?Tissue Debrided: ?Skin/Subcutaneous Tissue N/A N/A ?Level: ?6 N/A N/A ?Debridement A (sq cm): ?rea ?Curette N/A N/A ?Instrument: ?Minimum N/A N/A ?Bleeding: ?Pressure N/A N/A ?Hemostasis A chieved: ?0 N/A N/A ?Procedural Pain: ?0 N/A N/A ?Post Procedural Pain: ?Procedure was tolerated well N/A N/A ?Debridement Treatment Response: ?3x2x0.2 N/A N/A ?Post Debridement Measurements L x ?W x D (cm) ?0.942 N/A N/A ?Post Debridement Volume: (cm?) ?Compression Therapy N/A N/A ?Procedures Performed: ?Debridement ?Treatment Notes ?Wound #1 (Lower Leg) Wound Laterality: Left, Lateral ?Cleanser ?Soap and Water ?Discharge Instruction: May shower and wash wound with dial antibacterial soap and water prior to dressing change. ?Wound Cleanser ?Discharge Instruction: Cleanse the wound with wound cleanser prior to applying a clean dressing using gauze sponges, not tissue or cotton balls. ?Peri-Wound Care ?Sween Lotion (Moisturizing lotion) ?Discharge Instruction: Apply moisturizing lotion as directed ?Topical ?Keystone T opical Antibiotic ?Discharge Instruction: Use and apply daily to wound bed. ?Primary Dressing ?Cutimed Sorbact Swab ?Discharge Instruction: apply hydrogel over the sorbact. ?Secondary Dressing ?Zetuvit Plus 4x8 in ?Discharge Instruction: Apply over primary dressing as directed. ?Secured With ?Compression Wrap ?ThreePress (3 layer compression wrap) ?Discharge Instruction: Apply three layer compression as directed. ?Compression Stockings ?Add-Ons ?Electronic Signature(s) ?Signed: 08/06/2021 2:02:19 PM By: [name]  DO ?Signed: 08/06/2021 5:34:01 PM By: [name], [name] ?Entered By: [name] on 08/06/2021 13:56:09 ?-------------------------------------------------------------------------------- ?Multi-Disciplin

## 2021-08-13 ENCOUNTER — Encounter (HOSPITAL_BASED_OUTPATIENT_CLINIC_OR_DEPARTMENT_OTHER): Payer: No Typology Code available for payment source | Admitting: Internal Medicine

## 2021-08-13 DIAGNOSIS — L97822 Non-pressure chronic ulcer of other part of left lower leg with fat layer exposed: Secondary | ICD-10-CM | POA: Diagnosis not present

## 2021-08-14 NOTE — Progress Notes (Signed)
OLLIN, HOCHMUTH ([id number]) ?Visit Report for 08/13/2021 ?Arrival Information Details ?Patient Name: Date of Service: ?[name], [name] 08/13/2021 10:30 A M ?Medical Record Number: [id number] ?Patient Account Number: [id number] ?Date of Birth/Sex: Treating RN: ?1955/11/17 (66 y.o. M) [name], [name] ?Primary Care Donavon Kimrey: [name] Other Clinician: ?Referring Mana Morison: ?Treating Garrie Elenes/Extender: [name] ?[name] ?Weeks in Treatment: 9 ?Visit Information History Since Last Visit ?Added or deleted any medications: No ?Patient Arrived: Ambulatory ?Any new allergies or adverse reactions: No ?Arrival Time: 10:35 ?Had a fall or experienced change in No ?Accompanied By: self ?activities of daily living that may affect ?Transfer Assistance: None ?risk of falls: ?Patient Identification Verified: Yes ?Signs or symptoms of abuse/neglect since last visito No ?Secondary Verification Process Completed: Yes ?Hospitalized since last visit: No ?Patient Requires Transmission-Based Precautions: No ?Implantable device outside of the clinic excluding No ?Patient Has Alerts: Yes ?cellular tissue based products placed in the center ?Patient Alerts: Patient on Blood Thinner since last visit: ?07/10/2021 TBI L: 065 ?Has Dressing in Place as Prescribed: Yes ?07/10/21 ABI: rest L 1.34 ?Has Compression in Place as Prescribed: Yes ?07/10/21 ABI: run L 0.92 ?Pain Present Now: No ?Electronic Signature(s) ?Signed: 08/14/2021 1:08:25 PM By: [name], [name] ?Entered By: Shawn Stall on 08/13/2021 10:39:31 ?-------------------------------------------------------------------------------- ?Compression Therapy Details ?Patient Name: Date of Service: ?[name], [name] 08/13/2021 10:30 A M ?Medical Record Number: [id number] ?Patient Account Number: [id number] ?Date of Birth/Sex: Treating RN: ?1956/02/21 (66 y.o. M) [name], [name] ?Primary Care Nehemias Sauceda: [name] Other Clinician: ?Referring Cherelle Midkiff: ?Treating Ceairra Mccarver/Extender:  [name] ?[name] ?Weeks in Treatment: 9 ?Compression Therapy Performed for Wound Assessment: Wound #1 Left,Lateral Lower Leg ?Performed By: Clinician Shawn Stall, RN ?Compression Type: Three Layer ?Post Procedure Diagnosis ?Same as Pre-procedure ?Electronic Signature(s) ?Signed: 08/14/2021 1:08:25 PM By: [name], [name] ?Entered By: Shawn Stall on 08/13/2021 10:58:26 ?-------------------------------------------------------------------------------- ?Encounter Discharge Information Details ?Patient Name: ?Date of Service: ?[name], [place] Ryan O. 08/13/2021 10:30 A M ?Medical Record Number: [id number] ?Patient Account Number: [id number] ?Date of Birth/Sex: ?Treating RN: ?May 30, 1955 (66 y.o. M) [name], [name] ?Primary Care Alli Jasmer: [name] ?Other Clinician: ?Referring Salahuddin Arismendez: ?Treating Salomon Ganser/Extender: [name] ?[name] ?Weeks in Treatment: 9 ?Encounter Discharge Information Items ?Discharge Condition: Stable ?Ambulatory Status: Ambulatory ?Discharge Destination: Home ?Transportation: Private Auto ?Accompanied By: self ?Schedule Follow-up Appointment: Yes ?Clinical Summary of Care: ?Electronic Signature(s) ?Signed: 08/14/2021 1:08:25 PM By: [name], [name] ?Entered By: Shawn Stall on 08/13/2021 11:03:38 ?-------------------------------------------------------------------------------- ?Lower Extremity Assessment Details ?Patient Name: ?Date of Service: ?[name], [place] O. 08/13/2021 10:30 A M ?Medical Record Number: [id number] ?Patient Account Number: [id number] ?Date of Birth/Sex: ?Treating RN: ?Oct 12, 1955 (66 y.o. M) [name], [name] ?Primary Care Aveleen Nevers: [name] ?Other Clinician: ?Referring Benedicta Sultan: ?Treating Ingris Pasquarella/Extender: [name] ?[name] ?Weeks in Treatment: 9 ?Edema Assessment ?Assessed: [Left: Yes] [Right: No] ?Edema: [Left: N] [Right: o] ?Calf ?Left: Right: ?Point of Measurement: 41 cm From Medial Instep 46.5 cm ?Ankle ?Left: Right: ?Point  of Measurement: 15 cm From Medial Instep 28 cm ?Vascular Assessment ?Pulses: ?Dorsalis Pedis ?Palpable: [Left:Yes] ?Electronic Signature(s) ?Signed: 08/14/2021 1:08:25 PM By: [name], [name] ?Entered By: Shawn Stall on 08/13/2021 10:43:28 ?-------------------------------------------------------------------------------- ?Multi Wound Chart Details ?Patient Name: ?Date of Service: ?Ryan [place], [place] Ryan O. 08/13/2021 10:30 A M ?Medical Record Number: [id number] ?Patient Account Number: [id number] ?Date of Birth/Sex: ?Treating RN: ?[birth year] (66 y.o. M) ?Primary Care Raequan Vanschaick: [name] ?Other Clinician: ?Referring Ahlaya Ende: ?Treating Verlon Carcione/Extender: [name] ?[name] ?Weeks in Treatment: 9 ?Vital  Signs ?Height(in): 75 ?Pulse(bpm): 89 ?Weight(lbs): 400 ?Blood Pressure(mmHg): 149/70 ?Body Mass Index(BMI): 50 ?Temperature(??F): 98.4 ?Respiratory Rate(breaths/min): 20 ?Photos: [N/A:N/A] ?Left, Lateral Lower Leg N/A N/A ?Wound Location: ?Gradually Appeared N/A N/A ?Wounding Event: ?Venous Leg Ulcer N/A N/A ?Primary Etiology: ?Lymphedema N/A N/A ?Secondary Etiology: ?Hypertension N/A N/A ?Comorbid History: ?02/14/2021 N/A N/A ?Date Acquired: ?96 N/A N/A ?Weeks of Treatment: ?Open N/A N/A ?Wound Status: ?No N/A N/A ?Wound Recurrence: ?Yes N/A N/A ?Clustered Wound: ?1 N/A N/A ?Clustered Quantity: ?1.8x0.6x0.1 N/A N/A ?Measurements L x W x D (cm) ?0.848 N/A N/A ?A (cm?) : ?rea ?0.085 N/A N/A ?Volume (cm?) : ?98.10% N/A N/A ?% Reduction in A rea: ?99.00% N/A N/A ?% Reduction in Volume: ?Full Thickness Without Exposed N/A N/A ?Classification: ?Support Structures ?Medium N/A N/A ?Exudate Amount: ?Serosanguineous N/A N/A ?Exudate Type: ?red, brown N/A N/A ?Exudate Color: ?Distinct, outline attached N/A N/A ?Wound Margin: ?Large (67-100%) N/A N/A ?Granulation Amount: ?Red N/A N/A ?Granulation Quality: ?Small (1-33%) N/A N/A ?Necrotic Amount: ?Fat Layer (Subcutaneous Tissue): Yes N/A N/A ?Exposed  Structures: ?Fascia: No ?Tendon: No ?Muscle: No ?Joint: No ?Bone: No ?Large (67-100%) N/A N/A ?Epithelialization: ?Compression Therapy N/A N/A ?Procedures Performed: ?Treatment Notes ?Electronic Signature(s) ?Signed: 08/13/2021 4:28:39 PM By: [name] MD ?Entered By: [name] on 08/13/2021 11:00:30 ?-------------------------------------------------------------------------------- ?Multi-Disciplinary Care Plan Details ?Patient Name: ?Date of Service: ?Ryan [place], [place] O. 08/13/2021 10:30 A M ?Medical Record Number: [id number] ?Patient Account Number: [id number] ?Date of Birth/Sex: ?Treating RN: ?02-22-1956 (66 y.o. M) [name], [name] ?Primary Care Sharon Stapel: [name] ?Other Clinician: ?Referring Ertha Nabor: ?Treating Fields Oros/Extender: [name] ?[name] ?Weeks in Treatment: 9 ?Active Inactive ?Venous Leg Ulcer ?Nursing Diagnoses: ?Potential for venous Insuffiency (use before diagnosis confirmed) ?Goals: ?Patient will maintain optimal edema control ?Date Initiated: 06/11/2021 ?Target Resolution Date: 08/28/2021 ?Goal Status: Active ?Patient/caregiver will verbalize understanding of disease process and disease management ?Date Initiated: 06/11/2021 ?Target Resolution Date: 08/28/2021 ?Goal Status: Active ?Interventions: ?Assess peripheral edema status every visit. ?Compression as ordered ?Provide education on venous insufficiency ?Notes: ?Electronic Signature(s) ?Signed: 08/14/2021 1:08:25 PM By: [name], [name] ?Entered By: Shawn Stall on 08/13/2021 10:52:03 ?-------------------------------------------------------------------------------- ?Pain Assessment Details ?Patient Name: ?Date of Service: ?Ryan [place], [place] O. 08/13/2021 10:30 A M ?Medical Record Number: [id number] ?Patient Account Number: [id number] ?Date of Birth/Sex: ?Treating RN: ?07/13/1955 (66 y.o. M) [name], [name] ?Primary Care Cornel Werber: [name] ?Other Clinician: ?Referring Kaleya Douse: ?Treating Zaila Crew/Extender: [name] ?[name] ?Weeks in Treatment: 9 ?Active Problems ?Location of Pain Severity and Description of Pain ?Patient Has Paino No ?Site Locations ?Rate the pain. ?Rate the pain. ?Current Pain Level: 0 ?Pain Management an

## 2021-08-14 NOTE — Progress Notes (Signed)
[name], [name] ([id number]) ?Visit Report for 08/13/2021 ?HPI Details ?Patient Name: Date of Service: ?RO [place], [name] 08/13/2021 10:30 A M ?Medical Record Number: [id number] ?Patient Account Number: [id number] ?Date of Birth/Sex: Treating RN: ?04-13-1956 (66 y.o. M) ?Primary Care Provider: [name] Other Clinician: ?Referring Provider: ?Treating Provider/Extender: [name] ?[name] ?Weeks in Treatment: 9 ?History of Present Illness ?HPI Description: Admission 06/11/2021 ?[name], [name] is a 66 year old male with a past medical history of chronic venous insufficiency and essential hypertension that presents to the clinic ?for a 19-month history of nonhealing ulcer to his left lower extremity. He thinks it started out as a bug bite and he scratched it. He has currently been keeping ?the area covered. He reports chronic pain to the wound bed. He has been given Keflex for this issue. He denies purulent drainage. He denies systemic signs ?of infection. 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 He is reported to not be a ?candidate for ablation due to morbid obesity and inability to wear compression. ?2/16; patient presents for follow-up. He has no issues or complaints today. He states he tolerated the compression wrap well. He continues to report chronic ?pain to the wound site. ?2/23; patient presents for follow-up. He has no issues or complaints today. He reports improvement in his chronic pain to the wound site. He states that the ?Keystone antibiotics is arriving this week. He currently denies systemic signs of infection. ?3/2; patient presents for follow-up. He has been using Hydrofera Blue and gentamicin ointment to the wound bed without any issues. He reports an ?improvement in chronic pain to the wound bed. He obtained the [facility] antibiotic but has not started it yet. He brought this in today. ?07/09/2021: Wounds continue  to decrease in size. They are less painful and he is able to tolerate debridement better. He has been using Keystone antibiotic ?under Hydrofera Blue with Kerlix and Coban wrapping. We had not been able to get formal arterial studies to try and apply greater compression, but he is ?scheduled to undergo this tomorrow. Hopefully that will allow [place] to use 3 or 4 layer compression which I think he really needs. No concern for infection. ?3/16; patient presents for follow-up. He has been using Keystone antibiotics with Hydrofera Blue. He has no issues or complaints today. He was able to obtain ?his ABIs. Resting ABI was 1.27 and TBI was 0.72. It was noted to have normal arterial Doppler exam of the bilateral lower extremities. There is no evidence of ?lower extremity arterial occlusion. ?3/24; patient presents for follow-up. He has no issues or complaints today. He has been using Keystone antibiotics with Hydrofera Blue under 3 layer ?compression. The Hydrofera Blue was sticking to the wound bed when the wrap was removed today. Patient denies signs of infection. He reports improvement ?in his chronic pain to the wound bed ?3/31; patient presents for follow-up. He has tolerated the compression wrap well. He has no issues or complaints today. ?4/6; patient presents for follow-up. He has no issues or complaints today. He is doing well with Keystone antibiotics and Sorbact under 3 layer compression. ?4/13; venous insufficiency wounds on the left lower leg in the setting of chronic venous insufficiency and lymphedema. Using [place] and [address] One of ?his 2 open areas is healed the other appears smaller ?Electronic Signature(s) ?Signed: 08/13/2021 4:28:39 PM By: [name] MD ?Entered By: [name] on 08/13/2021 11:01:15 ?-------------------------------------------------------------------------------- ?Physical  Exam Details ?Patient Name: Date of Service: ?RO [place], [name] 08/13/2021 10:30 A M ?Medical Record  Number: [id number] ?Patient Account Number: [id number] ?Date of Birth/Sex: Treating RN: ?Jul 18, 1955 (66 y.o. M) ?Primary Care Provider: [name] Other Clinician: ?Referring Provider: ?Treating Provider/Extender: [name] ?[name] ?Weeks in Treatment: 9 ?Constitutional ?Patient is hypertensive.. Pulse regular and within target range for patient.Marland Kitchen Respirations regular, non-labored and within target range.. Temperature is normal and ?within the target range for the patient.Marland Kitchen Appears in no distress. ?Notes ?Wound exam; left lower extremity. 2 small wounds on the left anterior mid leg. The more distal one is healed. The other one smaller healthy granulation no ?debridement is required. His edema control is adequate. No evidence of infection in the surrounding tissue. ?Electronic Signature(s) ?Signed: 08/13/2021 4:28:39 PM By: [name] MD ?Entered By: [name] on 08/13/2021 11:03:39 ?-------------------------------------------------------------------------------- ?Physician Orders Details ?Patient Name: Date of Service: ?RO [place], [name] 08/13/2021 10:30 A M ?Medical Record Number: [id number] ?Patient Account Number: [id number] ?Date of Birth/Sex: Treating RN: ?Apr 10, 1956 (66 y.o. 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 - Thursday 08/20/2021 1030 Dr. Mikey Bussing and [place], Room 8 ?Return A ?Friday 08/28/2021 1030 Dr. Mikey Bussing and Yvonne Kendall, Room 8 ?Thursday 09/03/2021 1115 Dr. Mikey Bussing and  Yvonne Kendall, Room 8 ?Other: - Bring in stockings weekly to appt time. ?Bathing/ Shower/ Hygiene ?May shower with protection but do not get wound dressing(s) wet. ?Edema Control - Lymphedema / SCD / Other ?Elevate legs to the level of the heart or above for 30 minutes daily and/or when sitting, a frequency of: - 3-4 times throughout the day. ?Avoid standing for long periods of time. ?Exercise regularly ?Moisturize legs daily. - right leg every night before bed. ?Compression stocking or Garment 20-30 mm/Hg pressure to: - bring in weekly. ?Additional Orders / Instructions ?Follow Nutritious Diet ?Wound Treatment ?Wound #1 - Lower Leg Wound Laterality: Left, Lateral ?Cleanser: Soap and Water 1 x Per Week/30 Days ?Discharge Instructions: May shower and wash wound with dial antibacterial soap and water prior to dressing change. ?Cleanser: Wound Cleanser 1 x Per Week/30 Days ?Discharge Instructions: Cleanse the wound with wound cleanser prior to applying a clean dressing using gauze sponges, not tissue or cotton balls. ?Peri-Wound Care: Sween Lotion (Moisturizing lotion) 1 x Per Week/30 Days ?Discharge Instructions: Apply moisturizing lotion as directed ?Topical: Keystone T opical Antibiotic 1 x Per Week/30 Days ?Discharge Instructions: Use and apply daily to wound bed. ?Prim Dressing: Cutimed Sorbact Swab 1 x Per Week/30 Days ?ary ?Discharge Instructions: apply hydrogel over the sorbact. ?Secondary Dressing: Zetuvit Plus 4x8 in (Generic) 1 x Per Week/30 Days ?Discharge Instructions: Apply over primary dressing as directed. ?Compression Wrap: ThreePress (3 layer compression wrap) 1 x Per Week/30 Days ?Discharge Instructions: Apply three layer compression as directed. ?Electronic Signature(s) ?Signed: 08/13/2021 4:28:39 PM By: [name] MD ?Signed: 08/14/2021 1:08:25 PM By: [name], [name] ?Entered By: Shawn Stall on 08/13/2021  11:02:17 ?-------------------------------------------------------------------------------- ?Problem List Details ?Patient Name: ?Date of Service: ?RO [place], [place] MUEL O. 08/13/2021 10:30 A M ?Medical Record Number: [id number] ?Patient Account Number: [id number] ?Date of Birth/Sex: ?  Treating RN: ?09-Jun-1955

## 2021-08-20 ENCOUNTER — Encounter (HOSPITAL_BASED_OUTPATIENT_CLINIC_OR_DEPARTMENT_OTHER): Payer: No Typology Code available for payment source | Admitting: Internal Medicine

## 2021-08-20 DIAGNOSIS — L97822 Non-pressure chronic ulcer of other part of left lower leg with fat layer exposed: Secondary | ICD-10-CM | POA: Diagnosis not present

## 2021-08-20 NOTE — Progress Notes (Signed)
[name], [name] ([id number]) ?Visit Report for 08/20/2021 ?Chief Complaint Document Details ?Patient Name: Date of Service: ?RO [place], [name] 08/20/2021 10:30 A M ?Medical Record Number: [id number] ?Patient Account Number: [id number] ?Date of Birth/Sex: Treating RN: ?April 06, 1956 (66 y.o. M) ?Primary Care Provider: [name] Other Clinician: ?Referring Provider: ?Treating Provider/Extender: [name] ?[name] ?Weeks in Treatment: 10 ?Information Obtained from: Patient ?Chief Complaint ?Left lower extremity wounds ?Electronic Signature(s) ?Signed: 08/20/2021 1:32:24 PM By: [name] DO ?Entered By: [name] on 08/20/2021 12:48:30 ?-------------------------------------------------------------------------------- ?Debridement Details ?Patient Name: Date of Service: ?RO [place], [name] 08/20/2021 10:30 A M ?Medical Record Number: [id number] ?Patient Account Number: [id number] ?Date of Birth/Sex: Treating RN: ?[birth year] (66 y.o. M) [name], [name] ?Primary Care Provider: [name] Other Clinician: ?Referring Provider: ?Treating Provider/Extender: [name] ?[name] ?Weeks in Treatment: 10 ?Debridement Performed for Assessment: Wound #1 Left,Lateral Lower Leg ?Performed By: Physician [name], DO ?Debridement Type: Debridement ?Severity of Tissue Pre Debridement: Fat layer exposed ?Level of Consciousness (Pre-procedure): Awake and Alert ?Pre-procedure Verification/Time Out Yes - 11:10 ?Taken: ?Start Time: 11:11 ?Pain Control: ?Other : benzocaine 20% ?T Area Debrided (L x W): ?otal 1.5 (cm) x 1 (cm) = 1.5 (cm?) ?Tissue and other material debrided: ?Viable, Non-Viable, Slough, Subcutaneous, Skin: Dermis , Skin: Epidermis, Fibrin/Exudate, Slough ?Level: Skin/Subcutaneous Tissue ?Debridement Description: Excisional ?Instrument: Curette ?Bleeding: Minimum ?Hemostasis Achieved: Pressure ?End Time: 11:13 ?Procedural Pain: 0 ?Post Procedural Pain: 0 ?Response to Treatment:  Procedure was tolerated well ?Level of Consciousness (Post- Awake and Alert ?procedure): ?Post Debridement Measurements of Total Wound ?Length: (cm) 0.8 ?Width: (cm) 0.4 ?Depth: (cm) 0.1 ?Volume: (cm?) 0.025 ?Character of Wound/Ulcer Post Debridement: Improved ?Severity of Tissue Post Debridement: Fat layer exposed ?Post Procedure Diagnosis ?Same as Pre-procedure ?Electronic Signature(s) ?Signed: 08/20/2021 1:32:24 PM By: [name] DO ?Signed: 08/20/2021 5:36:27 PM By: [name], [name] ?Entered By: Shawn Stall on 08/20/2021 11:14:13 ?-------------------------------------------------------------------------------- ?HPI Details ?Patient Name: Date of Service: ?RO [place], [name] 08/20/2021 10:30 A M ?Medical Record Number: [id number] ?Patient Account Number: [id number] ?Date of Birth/Sex: Treating RN: ?November 26, 1955 (66 y.o. M) ?Primary Care Provider: [name] Other Clinician: ?Referring Provider: ?Treating Provider/Extender: [name] ?[name] ?Weeks in Treatment: 10 ?History of Present Illness ?HPI Description: Admission 06/11/2021 ?[name], [name] is a 66 year old male with a past medical history of chronic venous insufficiency and essential hypertension that presents to the clinic ?for a 68-month history of nonhealing ulcer to his left lower extremity. He thinks it started out as a bug bite and he scratched it. He has currently been keeping ?the area covered. He reports chronic pain to the wound bed. He has been given Keflex for this issue. He denies purulent drainage. He denies systemic signs ?of infection. 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 He is reported to not be a ?candidate for ablation due to morbid obesity and inability to wear compression. ?2/16; patient presents for follow-up. He has no issues or complaints today. He states he tolerated the compression wrap well. He continues to report chronic ?pain to  the wound site. ?2/23; patient presents for follow-up. He has no issues or complaints today. He reports improvement in his chronic pain to the wound site. He states that the ?[facility]stone antibiotics is arriving this week. He currently denies systemic signs of infection. ?3/2; patient presents for follow-up. He has been using Hydrofera Blue and gentamicin ointment to the wound bed without  any issues. He reports an ?improvement in chronic pain to the wound bed. He obtained the [facility] antibiotic but has not started it yet. He brought this in today. ?07/09/2021: Wounds continue to decrease in size. They are less painful and he is able to tolerate debridement better. He has been using Keystone antibiotic ?under Hydrofera Blue with Kerlix and Coban wrapping. We had not been able to get formal arterial studies to try and apply greater compression, but he is ?scheduled to undergo this tomorrow. Hopefully that will allow [place] to use 3 or 4 layer compression which I think he really needs. No concern for infection. ?3/16; patient presents for follow-up. He has been using Keystone antibiotics with Hydrofera Blue. He has no issues or complaints today. He was able to obtain ?his ABIs. Resting ABI was 1.27 and TBI was 0.72. It was noted to have normal arterial Doppler exam of the bilateral lower extremities. There is no evidence of ?lower extremity arterial occlusion. ?3/24; patient presents for follow-up. He has no issues or complaints today. He has been using Keystone antibiotics with Hydrofera Blue under 3 layer ?compression. The Hydrofera Blue was sticking to the wound bed when the wrap was removed today. Patient denies signs of infection. He reports improvement ?in his chronic pain to the wound bed ?3/31; patient presents for follow-up. He has tolerated the compression wrap well. He has no issues or complaints today. ?4/6; patient presents for follow-up. He has no issues or complaints today. He is doing well with Keystone  antibiotics and Sorbact under 3 layer compression. ?4/13; venous insufficiency wounds on the left lower leg in the setting of chronic venous insufficiency and lymphedema. Using [place] and [address] One of ?his 2 open areas is healed the other appears smaller ?4/20; patient presents for follow-up. He has 1 remaining wound. He has no issues or complaints today. He denies signs of infection. ?Electronic Signature(s) ?Signed: 08/20/2021 1:32:24 PM By: [name] DO ?Entered By: [name] on 08/20/2021 12:49:01 ?-------------------------------------------------------------------------------- ?Physical Exam Details ?Patient Name: Date of Service: ?RO [place], [name] 08/20/2021 10:30 A M ?Medical Record Number: [id number] ?Patient Account Number: [id number] ?Date of Birth/Sex: Treating RN: ?16-Nov-1955 (66 y.o. M) ?Primary Care Provider: [name] Other Clinician: ?Referring Provider: ?Treating Provider/Extender: [name] ?[name] ?Weeks in Treatment: 10 ?Constitutional ?respirations regular, non-labored and within target range for patient.Marland Kitchen ?Psychiatric ?pleasant and cooperative. ?Notes ?Left lower extremity: 1 remaining wound to the distal lateral aspect with granulation tissue and nonviable tissue present. No signs of surrounding infection. ?Electronic Signature(s) ?Signed: 08/20/2021 1:32:24 PM By: [name] DO ?Entered By: [name] on 08/20/2021 12:49:50 ?-------------------------------------------------------------------------------- ?Physician Orders Details ?Patient Name: Date of Service: ?RO [place], [name] 08/20/2021 10:30 A M ?Medical Record Number: [id number] ?Patient Account Number: [id number] ?Date of Birth/Sex: Treating RN: ?10/27/1955 (66 y.o. M) [name], [name] ?Primary Care Provider: [name] Other Clinician: ?Referring Provider: ?Treating Provider/Extender: [name] ?[name] ?Weeks in Treatment: 10 ?Verbal / Phone Orders: No ?Diagnosis  Coding ?ICD-10 Coding ?Code Description ?E93.810 Non-pressure chronic ulcer of other part of left lower leg with fat layer exposed ?I89.0 Lymphedema, not elsewhere classified ?F75.102 Chronic venous hypertension (idiopathic)

## 2021-08-20 NOTE — Progress Notes (Signed)
[name], [name] ([id number]) ?Visit Report for 08/20/2021 ?Arrival Information Details ?Patient Name: Date of Service: ?RO [place], [name] 08/20/2021 10:30 A M ?Medical Record Number: [id number] ?Patient Account Number: [id number] ?Date of Birth/Sex: Treating RN: ?[birth year] (66 y.o. M) ?Primary Care Shawni Volkov: [name] Other Clinician: ?Referring Maricus Tanzi: ?Treating Lindberg Zenon/Extender: [name] ?[name] ?Weeks in Treatment: 10 ?Visit Information History Since Last Visit ?Added or deleted any medications: No ?Patient Arrived: Ambulatory ?Any new allergies or adverse reactions: No ?Arrival Time: 10:47 ?Had a fall or experienced change in No ?Accompanied By: self ?activities of daily living that may affect ?Transfer Assistance: None ?risk of falls: ?Patient Identification Verified: Yes ?Signs or symptoms of abuse/neglect since last visito No ?Secondary Verification Process Completed: Yes ?Hospitalized since last visit: No ?Patient Requires Transmission-Based Precautions: No ?Implantable device outside of the clinic excluding No ?Patient Has Alerts: Yes ?cellular tissue based products placed in the center ?Patient Alerts: Patient on Blood Thinner since last visit: ?07/10/2021 TBI L: 065 ?Has Dressing in Place as Prescribed: Yes ?07/10/21 ABI: rest L 1.34 ?Pain Present Now: No ?07/10/21 ABI: run L 0.92 ?Electronic Signature(s) ?Signed: 08/20/2021 11:56:25 AM By: Karl Ito ?Entered By: Karl Ito on 08/20/2021 10:48:02 ?-------------------------------------------------------------------------------- ?Compression Therapy Details ?Patient Name: Date of Service: ?RO [place], [name] 08/20/2021 10:30 A M ?Medical Record Number: [id number] ?Patient Account Number: [id number] ?Date of Birth/Sex: Treating RN: ?10-10-1955 (66 y.o. M) [name], [name] ?Primary Care Jamelle Noy: [name] Other Clinician: ?Referring Justan Gaede: ?Treating Pharell Rolfson/Extender: [name] ?[name] ?Weeks in Treatment:  10 ?Compression Therapy Performed for Wound Assessment: Wound #1 Left,Lateral Lower Leg ?Performed By: Clinician Shawn Stall, RN ?Compression Type: Three Layer ?Post Procedure Diagnosis ?Same as Pre-procedure ?Electronic Signature(s) ?Signed: 08/20/2021 5:36:27 PM By: [name], [name] ?Entered By: Shawn Stall on 08/20/2021 11:12:25 ?-------------------------------------------------------------------------------- ?Encounter Discharge Information Details ?Patient Name: ?Date of Service: ?RO [place], [name] 08/20/2021 10:30 A M ?Medical Record Number: [id number] ?Patient Account Number: [id number] ?Date of Birth/Sex: ?Treating RN: ?1955/09/28 (66 y.o. M) [name], [name] ?Primary Care Lakecia Deschamps: [name] ?Other Clinician: ?Referring Jonnell Hentges: ?Treating Daron Breeding/Extender: [name] ?[name] ?Weeks in Treatment: 10 ?Encounter Discharge Information Items Post Procedure Vitals ?Discharge Condition: Stable ?Temperature (F): 97.7 ?Ambulatory Status: Ambulatory ?Pulse (bpm): 91 ?Discharge Destination: Home ?Respiratory Rate (breaths/min): 20 ?Transportation: Private Auto ?Blood Pressure (mmHg): 152/106 ?Accompanied By: self ?Schedule Follow-up Appointment: Yes ?Clinical Summary of Care: ?Electronic Signature(s) ?Signed: 08/20/2021 5:36:27 PM By: [name], [name] ?Entered By: Shawn Stall on 08/20/2021 11:19:09 ?-------------------------------------------------------------------------------- ?Lower Extremity Assessment Details ?Patient Name: ?Date of Service: ?RO [place], [name] 08/20/2021 10:30 A M ?Medical Record Number: [id number] ?Patient Account Number: [id number] ?Date of Birth/Sex: ?Treating RN: ?[birth year] (66 y.o. M) [name], [name] ?Primary Care Wylene Weissman: [name] ?Other Clinician: ?Referring Shannen Flansburg: ?Treating Jayde Mcallister/Extender: [name] ?[name] ?Weeks in Treatment: 10 ?Edema Assessment ?Assessed: [Left: Yes] [Right: No] ?Edema: [Left: N] [Right: o] ?Calf ?Left:  Right: ?Point of Measurement: 41 cm From Medial Instep 46 cm ?Ankle ?Left: Right: ?Point of Measurement: 15 cm From Medial Instep 28 cm ?Vascular Assessment ?Pulses: ?Dorsalis Pedis ?Palpable: [Left:Yes] ?Electronic Signature(s) ?Signed: 08/20/2021 5:36:27 PM By: [name], [name] ?Entered By: Shawn Stall on 08/20/2021 11:04:58 ?-------------------------------------------------------------------------------- ?Multi Wound Chart Details ?Patient Name: ?Date of Service: ?RO [place], [name] 08/20/2021 10:30 A M ?Medical Record Number: [id number] ?Patient Account Number: [id number] ?Date of Birth/Sex: ?Treating RN: ?May 16, 1955 (66 y.o. M) ?Primary Care Enisa Runyan: [name] ?Other Clinician: ?Referring Shaton Lore: ?Treating Shown Dissinger/Extender: [name] ?Tysinger,  Onalee Hua ?Weeks in Treatment: 10 ?Vital Signs ?Height(in): 75 ?Pulse(bpm): 91 ?Weight(lbs): 400 ?Blood Pressure(mmHg): 152/106 ?Body Mass Index(BMI): 50 ?Temperature(??F): 97.7 ?Respiratory Rate(breaths/min): 20 ?Photos: [N/A:N/A] ?Left, Lateral Lower Leg N/A N/A ?Wound Location: ?Gradually Appeared N/A N/A ?Wounding Event: ?Venous Leg Ulcer N/A N/A ?Primary Etiology: ?Lymphedema N/A N/A ?Secondary Etiology: ?Hypertension N/A N/A ?Comorbid History: ?02/14/2021 N/A N/A ?Date Acquired: ?41 N/A N/A ?Weeks of Treatment: ?Open N/A N/A ?Wound Status: ?No N/A N/A ?Wound Recurrence: ?Yes N/A N/A ?Clustered Wound: ?1 N/A N/A ?Clustered Quantity: ?0.8x0.4x0.1 N/A N/A ?Measurements L x W x D (cm) ?0.251 N/A N/A ?A (cm?) : ?rea ?0.025 N/A N/A ?Volume (cm?) : ?99.40% N/A N/A ?% Reduction in A rea: ?99.70% N/A N/A ?% Reduction in Volume: ?Full Thickness Without Exposed N/A N/A ?Classification: ?Support Structures ?Medium N/A N/A ?Exudate A mount: ?Serosanguineous N/A N/A ?Exudate Type: ?red, brown N/A N/A ?Exudate Color: ?Distinct, outline attached N/A N/A ?Wound Margin: ?Large (67-100%) N/A N/A ?Granulation A mount: ?Red N/A N/A ?Granulation Quality: ?Small (1-33%) N/A  N/A ?Necrotic A mount: ?Fat Layer (Subcutaneous Tissue): Yes N/A N/A ?Exposed Structures: ?Fascia: No ?Tendon: No ?Muscle: No ?Joint: No ?Bone: No ?Large (67-100%) N/A N/A ?Epithelialization: ?Debridement - Excisional N/A N/A ?Debridement: ?Pre-procedure Verification/Time Out 11:10 N/A N/A ?Taken: ?Other N/A N/A ?Pain Control: ?Subcutaneous, Slough N/A N/A ?Tissue Debrided: ?Skin/Subcutaneous Tissue N/A N/A ?Level: ?1.5 N/A N/A ?Debridement A (sq cm): ?rea ?Curette N/A N/A ?Instrument: ?Minimum N/A N/A ?Bleeding: ?Pressure N/A N/A ?Hemostasis A chieved: ?0 N/A N/A ?Procedural Pain: ?0 N/A N/A ?Post Procedural Pain: ?Procedure was tolerated well N/A N/A ?Debridement Treatment Response: ?0.8x0.4x0.1 N/A N/A ?Post Debridement Measurements L x ?W x D (cm) ?0.025 N/A N/A ?Post Debridement Volume: (cm?) ?Compression Therapy N/A N/A ?Procedures Performed: ?Debridement ?Treatment Notes ?Wound #1 (Lower Leg) Wound Laterality: Left, Lateral ?Cleanser ?Soap and Water ?Discharge Instruction: May shower and wash wound with dial antibacterial soap and water prior to dressing change. ?Wound Cleanser ?Discharge Instruction: Cleanse the wound with wound cleanser prior to applying a clean dressing using gauze sponges, not tissue or cotton balls. ?Peri-Wound Care ?Sween Lotion (Moisturizing lotion) ?Discharge Instruction: Apply moisturizing lotion as directed ?Topical ?Keystone T opical Antibiotic ?Discharge Instruction: Use and apply daily to wound bed. ?Primary Dressing ?Promogran Prisma Matrix, 4.34 (sq in) (silver collagen) ?Discharge Instruction: Moisten collagen with saline or hydrogel ?Secondary Dressing ?Zetuvit Plus 4x8 in ?Discharge Instruction: Apply over primary dressing as directed. ?Secured With ?Compression Wrap ?ThreePress (3 layer compression wrap) ?Discharge Instruction: Apply three layer compression as directed. ?Compression Stockings ?Add-Ons ?Electronic Signature(s) ?Signed: 08/20/2021 1:32:24 PM By: [name] DO ?Entered By: [name] on 08/20/2021 12:48:16 ?-------------------------------------------------------------------------------- ?Multi-Disciplinary Care Plan Details ?Patient Name: ?Date of Service: ?R

## 2021-08-25 ENCOUNTER — Other Ambulatory Visit: Payer: Self-pay | Admitting: Medical

## 2021-08-28 ENCOUNTER — Encounter (HOSPITAL_BASED_OUTPATIENT_CLINIC_OR_DEPARTMENT_OTHER): Payer: No Typology Code available for payment source | Admitting: Internal Medicine

## 2021-08-28 DIAGNOSIS — L97822 Non-pressure chronic ulcer of other part of left lower leg with fat layer exposed: Secondary | ICD-10-CM | POA: Diagnosis not present

## 2021-08-28 NOTE — Progress Notes (Signed)
[name], [name] ([id number]) ?Visit Report for 08/28/2021 ?HPI Details ?Patient Name: Date of Service: ?RO [place], [name] 08/28/2021 10:30 A M ?Medical Record Number: [id number] ?Patient Account Number: [id number] ?Date of Birth/Sex: Treating RN: ?[birth year] (66 y.o. M) [name], [name] ?Primary Care Provider: [name] Other Clinician: ?Referring Provider: ?Treating Provider/Extender: [name] ?[name] ?Weeks in Treatment: 11 ?History of Present Illness ?HPI Description: Admission 06/11/2021 ?[name], [name] is a 66 year old male with a past medical history of chronic venous insufficiency and essential hypertension that presents to the clinic ?for a 52-month history of nonhealing ulcer to his left lower extremity. He thinks it started out as a bug bite and he scratched it. He has currently been keeping ?the area covered. He reports chronic pain to the wound bed. He has been given Keflex for this issue. He denies purulent drainage. He denies systemic signs ?of infection. 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 He is reported to not be a ?candidate for ablation due to morbid obesity and inability to wear compression. ?2/16; patient presents for follow-up. He has no issues or complaints today. He states he tolerated the compression wrap well. He continues to report chronic ?pain to the wound site. ?2/23; patient presents for follow-up. He has no issues or complaints today. He reports improvement in his chronic pain to the wound site. He states that the ?Keystone antibiotics is arriving this week. He currently denies systemic signs of infection. ?3/2; patient presents for follow-up. He has been using Hydrofera Blue and gentamicin ointment to the wound bed without any issues. He reports an ?improvement in chronic pain to the wound bed. He obtained the [facility] antibiotic but has not started it yet. He brought this in today. ?07/09/2021:  Wounds continue to decrease in size. They are less painful and he is able to tolerate debridement better. He has been using Keystone antibiotic ?under Hydrofera Blue with Kerlix and Coban wrapping. We had not been able to get formal arterial studies to try and apply greater compression, but he is ?scheduled to undergo this tomorrow. Hopefully that will allow [place] to use 3 or 4 layer compression which I think he really needs. No concern for infection. ?3/16; patient presents for follow-up. He has been using Keystone antibiotics with Hydrofera Blue. He has no issues or complaints today. He was able to obtain ?his ABIs. Resting ABI was 1.27 and TBI was 0.72. It was noted to have normal arterial Doppler exam of the bilateral lower extremities. There is no evidence of ?lower extremity arterial occlusion. ?3/24; patient presents for follow-up. He has no issues or complaints today. He has been using Keystone antibiotics with Hydrofera Blue under 3 layer ?compression. The Hydrofera Blue was sticking to the wound bed when the wrap was removed today. Patient denies signs of infection. He reports improvement ?in his chronic pain to the wound bed ?3/31; patient presents for follow-up. He has tolerated the compression wrap well. He has no issues or complaints today. ?4/6; patient presents for follow-up. He has no issues or complaints today. He is doing well with Keystone antibiotics and Sorbact under 3 layer compression. ?4/13; venous insufficiency wounds on the left lower leg in the setting of chronic venous insufficiency and lymphedema. Using [place] and [address] One of ?his 2 open areas is healed the other appears smaller ?4/20; patient presents for follow-up. He has 1 remaining wound. He has no issues or complaints today.  He denies signs of infection. ?4/28; left lateral lower leg venous insufficiency. Apparently the collagen was stuck to the wound otherwise things look really healthy here. He has stockings in ?waiting for  the eventuality that this actually closes ?Electronic Signature(s) ?Signed: 08/28/2021 4:01:11 PM By: [name] MD ?Entered By: [name] on 08/28/2021 11:04:36 ?-------------------------------------------------------------------------------- ?Physical Exam Details ?Patient Name: Date of Service: ?RO [place], [name] 08/28/2021 10:30 A M ?Medical Record Number: [id number] ?Patient Account Number: [id number] ?Date of Birth/Sex: Treating RN: ?[birth year] (66 y.o. M) [name], [name] ?Primary Care Provider: [name] Other Clinician: ?Referring Provider: ?Treating Provider/Extender: [name] ?[name] ?Weeks in Treatment: 11 ?Constitutional ?Sitting or standing Blood Pressure is within target range for patient.. Pulse regular and within target range for patient.Marland Kitchen Respirations regular, non-labored and ?within target range.. Temperature is normal and within the target range for the patient.Marland Kitchen Appears in no distress. ?Notes ?Wound exam; left lower extremity. Good edema control. Very clean well granulated wound on the left lateral area. No major change in dimensions although the ?Prisma got stuck to the wound may have something to do with removing some of the epithelialization. No mechanical debridement was necessary ?Electronic Signature(s) ?Signed: 08/28/2021 4:01:11 PM By: [name] MD ?Entered By: [name] on 08/28/2021 11:05:47 ?-------------------------------------------------------------------------------- ?Physician Orders Details ?Patient Name: Date of Service: ?RO [place], [name] 08/28/2021 10:30 A M ?Medical Record Number: [id number] ?Patient Account Number: [id number] ?Date of Birth/Sex: Treating RN: ?July 30, 1955 (66 y.o. M) [name], [name] ?Primary Care Provider: [name] Other Clinician: ?Referring Provider: ?Treating Provider/Extender: [name] ?[name] ?Weeks in Treatment: 11 ?Verbal / Phone Orders: No ?Diagnosis Coding ?ICD-10 Coding ?Code Description ?N82.956  Non-pressure chronic ulcer of other part of left lower leg with fat layer exposed ?I89.0 Lymphedema, not elsewhere classified ?O13.086 Chronic venous hypertension (idiopathic) with ulcer of left lower extremity ?I10 Essential (primary) hypertension ?Follow-up Appointments ?ppointment in 1 week. - Thursday 09/03/2021 1115 Dr. Mikey Bussing and Yvonne Kendall, Room 8 ?Return A ?Thursday 09/10/2021 1115 Dr. Mikey Bussing and Yvonne Kendall, Room 8 ?Other: - Bring in stockings weekly to appt time. ?Bathing/ Shower/ Hygiene ?May shower with protection but do not get wound dressing(s) wet. ?Edema Control - Lymphedema / SCD / Other ?Elevate legs to the level of the heart or above for 30 minutes daily and/or when sitting, a frequency of: - 3-4 times throughout the day. ?Avoid standing for long periods of time. ?Exercise regularly ?Moisturize legs daily. - right leg every night before bed. ?Compression stocking or Garment 20-30 mm/Hg pressure to: - bring in weekly. ?Additional Orders / Instructions ?Follow Nutritious Diet ?Wound Treatment ?Wound #1 - Lower Leg Wound Laterality: Left, Lateral ?Cleanser: Soap and Water 1 x Per Week/30 Days ?Discharge Instructions: May shower and wash wound with dial antibacterial soap and water prior to dressing change. ?Cleanser: Wound Cleanser 1 x Per Week/30 Days ?Discharge Instructions: Cleanse the wound with wound cleanser prior to applying a clean dressing using gauze sponges, not tissue or cotton balls. ?Peri-Wound Care: Sween Lotion (Moisturizing lotion) 1 x Per Week/30 Days ?Discharge Instructions: Apply moisturizing lotion as directed ?Prim Dressing: Promogran Prisma Matrix, 4.34 (sq in) (silver collagen) ?ary 1 x Per Week/30 Days ?Discharge Instructions: Moisten collagen with hydrogel ?Secondary Dressing: Woven Gauze Sponge, Non-Sterile 4x4 in 1 x Per Week/30 Days ?Discharge Instructions: Apply over primary dressing as directed. ?Compression Wrap: ThreePress (3 layer compression wrap) 1 x Per Week/30  Days ?Discharge Instructions: Apply three layer compression as directed. ?Electronic Signature(s) ?Signed: 08/28/2021 4:01:11 PM By:  [name] MD ?Signed: 08/28/2021 4:52:29 PM By: [name], [name] ?Entered B

## 2021-09-03 ENCOUNTER — Encounter (HOSPITAL_BASED_OUTPATIENT_CLINIC_OR_DEPARTMENT_OTHER): Payer: PRIVATE HEALTH INSURANCE | Attending: Internal Medicine | Admitting: Internal Medicine

## 2021-09-03 DIAGNOSIS — G8929 Other chronic pain: Secondary | ICD-10-CM | POA: Insufficient documentation

## 2021-09-03 DIAGNOSIS — E669 Obesity, unspecified: Secondary | ICD-10-CM | POA: Insufficient documentation

## 2021-09-03 DIAGNOSIS — I890 Lymphedema, not elsewhere classified: Secondary | ICD-10-CM | POA: Diagnosis not present

## 2021-09-03 DIAGNOSIS — L97822 Non-pressure chronic ulcer of other part of left lower leg with fat layer exposed: Secondary | ICD-10-CM | POA: Diagnosis not present

## 2021-09-03 DIAGNOSIS — I872 Venous insufficiency (chronic) (peripheral): Secondary | ICD-10-CM | POA: Diagnosis not present

## 2021-09-03 DIAGNOSIS — I10 Essential (primary) hypertension: Secondary | ICD-10-CM | POA: Insufficient documentation

## 2021-09-03 DIAGNOSIS — I87312 Chronic venous hypertension (idiopathic) with ulcer of left lower extremity: Secondary | ICD-10-CM | POA: Diagnosis not present

## 2021-09-03 NOTE — Progress Notes (Signed)
KORIN, HARTWELL ([id number]) ?Visit Report for 09/03/2021 ?Chief Complaint Document Details ?Patient Name: Date of Service: ?RO [place], [name] 09/03/2021 11:15 A M ?Medical Record Number: [id number] ?Patient Account Number: [id number] ?Date of Birth/Sex: Treating RN: ?[birth year] (66 y.o. M) ?Primary Care Provider: [name] Other Clinician: ?Referring Provider: ?Treating Provider/Extender: [name] ?[name] ?Weeks in Treatment: 12 ?Information Obtained from: Patient ?Chief Complaint ?Left lower extremity wounds ?Electronic Signature(s) ?Signed: 09/03/2021 12:26:51 PM By: [name] DO ?Entered By: [name] on 09/03/2021 12:19:38 ?-------------------------------------------------------------------------------- ?HPI Details ?Patient Name: Date of Service: ?RO [place], [name] 09/03/2021 11:15 A M ?Medical Record Number: [id number] ?Patient Account Number: [id number] ?Date of Birth/Sex: Treating RN: ?06-05-1955 (66 y.o. M) ?Primary Care Provider: [name] Other Clinician: ?Referring Provider: ?Treating Provider/Extender: [name] ?[name] ?Weeks in Treatment: 12 ?History of Present Illness ?HPI Description: Admission 06/11/2021 ?Mr. Renton Berkley, [name] is a 66 year old male with a past medical history of chronic venous insufficiency and essential hypertension that presents to the clinic ?for a 63-month history of nonhealing ulcer to his left lower extremity. He thinks it started out as a bug bite and he scratched it. He has currently been keeping ?the area covered. He reports chronic pain to the wound bed. He has been given Keflex for this issue. He denies purulent drainage. He denies systemic signs ?of infection. 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 He is reported to not be a ?candidate for ablation due to morbid obesity and inability to wear compression. ?2/16; patient presents for follow-up. He has no issues  or complaints today. He states he tolerated the compression wrap well. He continues to report chronic ?pain to the wound site. ?2/23; patient presents for follow-up. He has no issues or complaints today. He reports improvement in his chronic pain to the wound site. He states that the ?Keystone antibiotics is arriving this week. He currently denies systemic signs of infection. ?3/2; patient presents for follow-up. He has been using Hydrofera Blue and gentamicin ointment to the wound bed without any issues. He reports an ?improvement in chronic pain to the wound bed. He obtained the [facility] antibiotic but has not started it yet. He brought this in today. ?07/09/2021: Wounds continue to decrease in size. They are less painful and he is able to tolerate debridement better. He has been using Keystone antibiotic ?under Hydrofera Blue with Kerlix and Coban wrapping. We had not been able to get formal arterial studies to try and apply greater compression, but he is ?scheduled to undergo this tomorrow. Hopefully that will allow [place] to use 3 or 4 layer compression which I think he really needs. No concern for infection. ?3/16; patient presents for follow-up. He has been using Keystone antibiotics with Hydrofera Blue. He has no issues or complaints today. He was able to obtain ?his ABIs. Resting ABI was 1.27 and TBI was 0.72. It was noted to have normal arterial Doppler exam of the bilateral lower extremities. There is no evidence of ?lower extremity arterial occlusion. ?3/24; patient presents for follow-up. He has no issues or complaints today. He has been using Keystone antibiotics with Hydrofera Blue under 3 layer ?compression. The Hydrofera Blue was sticking to the wound bed when the wrap was removed today. Patient denies signs of infection. He reports improvement ?in his chronic pain to the wound bed ?3/31; patient presents for follow-up. He has tolerated the compression wrap well. He has no  issues or complaints  today. ?4/6; patient presents for follow-up. He has no issues or complaints today. He is doing well with Keystone antibiotics and Sorbact under 3 layer compression. ?4/13; venous insufficiency wounds on the left lower leg in the setting of chronic venous insufficiency and lymphedema. Using [place] and [address] One of ?his 2 open areas is healed the other appears smaller ?4/20; patient presents for follow-up. He has 1 remaining wound. He has no issues or complaints today. He denies signs of infection. ?4/28; left lateral lower leg venous insufficiency. Apparently the collagen was stuck to the wound otherwise things look really healthy here. He has stockings in ?waiting for the eventuality that this actually closes ?5/4; patient presents for follow-up. Collagen under 3 layer compression is being used. He has no issues or complaints today. ?Electronic Signature(s) ?Signed: 09/03/2021 12:26:51 PM By: [name] DO ?Entered By: [name] on 09/03/2021 12:20:27 ?-------------------------------------------------------------------------------- ?Physical Exam Details ?Patient Name: Date of Service: ?RO [place], [name] 09/03/2021 11:15 A M ?Medical Record Number: [id number] ?Patient Account Number: [id number] ?Date of Birth/Sex: Treating RN: ?1956-01-27 (66 y.o. M) ?Primary Care Provider: [name] Other Clinician: ?Referring Provider: ?Treating Provider/Extender: [name] ?[name] ?Weeks in Treatment: 12 ?Constitutional ?respirations regular, non-labored and within target range for patient.Marland Kitchen ?Cardiovascular ?2+ dorsalis pedis/posterior tibialis pulses. ?Psychiatric ?pleasant and cooperative. ?Notes ?Left lower extremity: T the distal lateral aspect there is a small open wound with granulation tissue present. No signs of surrounding infection. ?o ?Electronic Signature(s) ?Signed: 09/03/2021 12:26:51 PM By: [name] DO ?Entered By: [name] on 09/03/2021  12:20:58 ?-------------------------------------------------------------------------------- ?Physician Orders Details ?Patient Name: Date of Service: ?RO [place], [name] 09/03/2021 11:15 A M ?Medical Record Number: [id number] ?Patient Account Number: [id number] ?Date of Birth/Sex: Treating RN: ?13-Nov-1955 (66 y.o. 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 - Thursday 09/10/2021 1115 Dr[address] Mikey Bussing and Yvonne Kendall, Room 8 ?Return A ?Thursday 5/18/223 1115 Dr. Mikey Bussing and Yvonne Kendall, Room 8 ?Other: - Bring in stockings weekly to appt time. ?Bathing/ Shower/ Hygiene ?May shower with protection but do not get wound dressing(s) wet. ?Edema Control - Lymphedema / SCD / Other ?Elevate legs to the level of the heart or above for 30 minutes daily and/or when sitting, a frequency of: - 3-4 times throughout the day. ?Avoid standing for long periods of time. ?Exercise regularly ?Moisturize legs daily. - right leg every night before bed. ?Compression stocking or Garment 20-30 mm/Hg pressure to: - bring in weekly. ?Additional Orders / Instructions ?Follow Nutritious Diet ?Wound Treatment ?Wound #1 - Lower Leg Wound Laterality: Left, Lateral ?Cleanser: Soap and Water 1 x Per Week/30 Days ?Discharge Instructions: May shower and wash wound with dial antibacterial soap and water prior to dressing change. ?Cleanser: Wound Cleanser 1 x Per Week/30 Days ?Discharge Instructions: Cleanse the wound with wound cleanser prior to applying a clean  dressing using gauze sponges, not tissue or cotton balls. ?Peri-Wound Care: Sween Lotion (Moisturizing lotion) 1 x Per Week/30 Days ?Discharge Instructions: Apply moisturizing lotion as directed ?Topical: Keystone topical compounding antibiotics

## 2021-09-03 NOTE — Progress Notes (Signed)
[name], [name] ([id number]) ?Visit Report for 09/03/2021 ?Arrival Information Details ?Patient Name: Date of Service: ?[name], [name] 09/03/2021 11:15 A M ?Medical Record Number: [id number] ?Patient Account Number: [id number] ?Date of Birth/Sex: Treating RN: ?07-26-1955 (67 y.o. M) [name], [name] ?Primary Care Kohl Polinsky: [name] Other Clinician: ?Referring Jazmin Vensel: ?Treating Nader Boys/Extender: [name] ?[name] ?Weeks in Treatment: 12 ?Visit Information History Since Last Visit ?Added or deleted any medications: No ?Patient Arrived: Ambulatory ?Any new allergies or adverse reactions: No ?Arrival Time: 11:34 ?Had a fall or experienced change in No ?Accompanied By: self ?activities of daily living that may affect ?Transfer Assistance: None ?risk of falls: ?Patient Identification Verified: Yes ?Signs or symptoms of abuse/neglect since last visito No ?Secondary Verification Process Completed: Yes ?Hospitalized since last visit: No ?Patient Requires Transmission-Based Precautions: No ?Implantable device outside of the clinic excluding No ?Patient Has Alerts: Yes ?cellular tissue based products placed in the center ?Patient Alerts: Patient on Blood Thinner since last visit: ?07/10/2021 TBI L: 065 ?Has Dressing in Place as Prescribed: Yes ?07/10/21 ABI: rest L 1.34 ?Has Compression in Place as Prescribed: Yes ?07/10/21 ABI: run L 0.92 ?Pain Present Now: No ?Electronic Signature(s) ?Signed: 09/03/2021 5:16:25 PM By: [name], [name] ?Entered By: Shawn Stall on 09/03/2021 11:34:33 ?-------------------------------------------------------------------------------- ?Compression Therapy Details ?Patient Name: Date of Service: ?[name], [name] 09/03/2021 11:15 A M ?Medical Record Number: [id number] ?Patient Account Number: [id number] ?Date of Birth/Sex: Treating RN: ?10-07-1955 (66 y.o. M) [name], [name] ?Primary Care Hiran Leard: [name] Other Clinician: ?Referring Sondra Blixt: ?Treating Stancil Deisher/Extender:  [name] ?[name] ?Weeks in Treatment: 12 ?Compression Therapy Performed for Wound Assessment: Wound #1 Left,Lateral Lower Leg ?Performed By: Clinician Shawn Stall, RN ?Compression Type: Three Layer ?Post Procedure Diagnosis ?Same as Pre-procedure ?Electronic Signature(s) ?Signed: 09/03/2021 5:16:25 PM By: [name], [name] ?Entered By: Shawn Stall on 09/03/2021 11:53:16 ?-------------------------------------------------------------------------------- ?Encounter Discharge Information Details ?Patient Name: ?Date of Service: ?[name], [name] 09/03/2021 11:15 A M ?Medical Record Number: [id number] ?Patient Account Number: [id number] ?Date of Birth/Sex: ?Treating RN: ?03-Sep-1955 (66 y.o. M) [name], [name] ?Primary Care Korynn Kenedy: [name] ?Other Clinician: ?Referring Ciara Kagan: ?Treating Codey Burling/Extender: [name] ?[name] ?Weeks in Treatment: 12 ?Encounter Discharge Information Items ?Discharge Condition: Stable ?Ambulatory Status: Ambulatory ?Discharge Destination: Home ?Transportation: Private Auto ?Accompanied By: self ?Schedule Follow-up Appointment: Yes ?Clinical Summary of Care: ?Electronic Signature(s) ?Signed: 09/03/2021 5:16:25 PM By: [name], [name] ?Entered By: Shawn Stall on 09/03/2021 11:56:37 ?-------------------------------------------------------------------------------- ?Lower Extremity Assessment Details ?Patient Name: ?Date of Service: ?[name], [name] 09/03/2021 11:15 A M ?Medical Record Number: [id number] ?Patient Account Number: [id number] ?Date of Birth/Sex: ?Treating RN: ?[birth year] (66 y.o. M) [name], [name] ?Primary Care Jniyah Dantuono: [name] ?Other Clinician: ?Referring Reather Steller: ?Treating Kerem Gilmer/Extender: [name] ?[name] ?Weeks in Treatment: 12 ?Edema Assessment ?Assessed: [Left: Yes] [Right: No] ?Edema: [Left: N] [Right: o] ?Calf ?Left: Right: ?Point of Measurement: 41 cm From Medial Instep 46 cm ?Ankle ?Left: Right: ?Point  of Measurement: 15 cm From Medial Instep 28 cm ?Vascular Assessment ?Pulses: ?Dorsalis Pedis ?Palpable: [Left:Yes] ?Electronic Signature(s) ?Signed: 09/03/2021 5:16:25 PM By: [name], [name] ?Entered By: Shawn Stall on 09/03/2021 11:41:48 ?-------------------------------------------------------------------------------- ?Multi Wound Chart Details ?Patient Name: ?Date of Service: ?[name], [name] 09/03/2021 11:15 A M ?Medical Record Number: [id number] ?Patient Account Number: [id number] ?Date of Birth/Sex: ?Treating RN: ?February 26, 1956 (66 y.o. M) ?Primary Care Achille Xiang: [name] ?Other Clinician: ?Referring Madison Direnzo: ?Treating Celines Femia/Extender: [name] ?[name] ?Weeks in Treatment: 12 ?Vital  Signs ?Height(in): 75 ?Pulse(bpm): 80 ?Weight(lbs): 400 ?Blood Pressure(mmHg): 149/94 ?Body Mass Index(BMI): 50 ?Temperature(??F): 98.5 ?Respiratory Rate(breaths/min): 20 ?Photos: [N/A:N/A] ?Left, Lateral Lower Leg N/A N/A ?Wound Location: ?Gradually Appeared N/A N/A ?Wounding Event: ?Venous Leg Ulcer N/A N/A ?Primary Etiology: ?Lymphedema N/A N/A ?Secondary Etiology: ?Hypertension N/A N/A ?Comorbid History: ?02/14/2021 N/A N/A ?Date Acquired: ?35 N/A N/A ?Weeks of Treatment: ?Open N/A N/A ?Wound Status: ?No N/A N/A ?Wound Recurrence: ?Yes N/A N/A ?Clustered Wound: ?1 N/A N/A ?Clustered Quantity: ?1.5x0.5x0.1 N/A N/A ?Measurements L x W x D (cm) ?0.589 N/A N/A ?A (cm?) : ?rea ?0.059 N/A N/A ?Volume (cm?) : ?98.70% N/A N/A ?% Reduction in A rea: ?99.30% N/A N/A ?% Reduction in Volume: ?Full Thickness Without Exposed N/A N/A ?Classification: ?Support Structures ?Medium N/A N/A ?Exudate Amount: ?Serosanguineous N/A N/A ?Exudate Type: ?red, brown N/A N/A ?Exudate Color: ?Distinct, outline attached N/A N/A ?Wound Margin: ?Large (67-100%) N/A N/A ?Granulation Amount: ?Red N/A N/A ?Granulation Quality: ?None Present (0%) N/A N/A ?Necrotic Amount: ?Fat Layer (Subcutaneous Tissue): Yes N/A N/A ?Exposed  Structures: ?Fascia: No ?Tendon: No ?Muscle: No ?Joint: No ?Bone: No ?Large (67-100%) N/A N/A ?Epithelialization: ?Compression Therapy N/A N/A ?Procedures Performed: ?Treatment Notes ?Wound #1 (Lower Leg) Wound Laterality: Left, Lateral ?Cleanser ?Soap and Water ?Discharge Instruction: May shower and wash wound with dial antibacterial soap and water prior to dressing change. ?Wound Cleanser ?Discharge Instruction: Cleanse the wound with wound cleanser prior to applying a clean dressing using gauze sponges, not tissue or cotton balls. ?Peri-Wound Care ?Sween Lotion (Moisturizing lotion) ?Discharge Instruction: Apply moisturizing lotion as directed ?Topical ?Keystone topical compounding antibiotics ?Discharge Instruction: apply directly to wound bed. ?Primary Dressing ?Xeroform Occlusive Gauze Dressing, 4x4 in ?Discharge Instruction: Apply to [facility] topical antibiotics. ?Secondary Dressing ?Woven Gauze Sponge, Non-Sterile 4x4 in ?Discharge Instruction: Apply over primary dressing as directed. ?Secured With ?Compression Wrap ?ThreePress (3 layer compression wrap) ?Discharge Instruction: Apply three layer compression as directed. ?Compression Stockings ?Add-Ons ?Electronic Signature(s) ?Signed: 09/03/2021 12:26:51 PM By: [name] DO ?Entered By: [name] on 09/03/2021 12:19:32 ?-------------------------------------------------------------------------------- ?Multi-Disciplinary Care Plan Details ?Patient Name: ?Date of Service: ?RO [place], [name] 09/03/2021 11:15 A M ?Medical Record Number: [id number] ?Patient Account Number: [id number] ?Date of Birth/Sex: ?Treating RN: ?November 02, 1955 (66 y.o. M) [name], [name] ?Primary Care Esbeydi Manago: [name] ?Other Clinician: ?Referring Emmalynne Courtney: ?Treating Omarian Jaquith/Extender: [name] ?[name] ?Weeks in Treatment: 12 ?Active Inactive ?Venous Leg Ulcer ?Nursing Diagnoses: ?Potential for venous Insuffiency (use before diagnosis confirmed) ?Goals: ?Patient  will maintain optimal edema control ?Date Initiated: 06/11/2021 ?Target Resolution Date: 10/02/2021 ?Goal Status: Active ?Patient/caregiver will verbalize understanding of disease process and disease management ?Date Initia

## 2021-09-10 ENCOUNTER — Encounter (HOSPITAL_BASED_OUTPATIENT_CLINIC_OR_DEPARTMENT_OTHER): Payer: PRIVATE HEALTH INSURANCE | Admitting: Internal Medicine

## 2021-09-11 ENCOUNTER — Encounter (HOSPITAL_BASED_OUTPATIENT_CLINIC_OR_DEPARTMENT_OTHER): Payer: PRIVATE HEALTH INSURANCE | Admitting: Internal Medicine

## 2021-09-11 DIAGNOSIS — I87312 Chronic venous hypertension (idiopathic) with ulcer of left lower extremity: Secondary | ICD-10-CM

## 2021-09-11 DIAGNOSIS — L97822 Non-pressure chronic ulcer of other part of left lower leg with fat layer exposed: Secondary | ICD-10-CM | POA: Diagnosis not present

## 2021-09-11 DIAGNOSIS — I10 Essential (primary) hypertension: Secondary | ICD-10-CM | POA: Diagnosis not present

## 2021-09-11 DIAGNOSIS — I890 Lymphedema, not elsewhere classified: Secondary | ICD-10-CM | POA: Diagnosis not present

## 2021-09-11 NOTE — Progress Notes (Addendum)
AXTON, DUTCH ([id number]) ?Visit Report for 09/11/2021 ?Arrival Information Details ?Patient Name: Date of Service: ?RO [place], [place]. 09/11/2021 11:00 A M ?Medical Record Number: [id number] ?Patient Account Number: [id number] ?Date of Birth/Sex: Treating RN: ?March 21, 1956 (66 y.o. M) [name], [name] ?Primary Care Temple Sporer: Chana Bode Other Clinician: ?Referring Herman Fiero: ?Treating Davelle Anselmi/Extender: Kalman Shan ?Chana Bode ?Weeks in Treatment: 13 ?Visit Information History Since Last Visit ?Added or deleted any medications: No ?Patient Arrived: Ambulatory ?Any new allergies or adverse reactions: No ?Arrival Time: 11:32 ?Had a fall or experienced change in No ?Accompanied By: self ?activities of daily living that may affect ?Transfer Assistance: None ?risk of falls: ?Patient Identification Verified: Yes ?Signs or symptoms of abuse/neglect since last visito No ?Secondary Verification Process Completed: Yes ?Hospitalized since last visit: No ?Patient Requires Transmission-Based Precautions: No ?Implantable device outside of the clinic excluding No ?Patient Has Alerts: Yes ?cellular tissue based products placed in the center ?Patient Alerts: Patient on Blood Thinner since last visit: ?07/10/2021 TBI L: 065 ?Has Dressing in Place as Prescribed: Yes ?07/10/21 ABI: rest L 1.34 ?Has Compression in Place as Prescribed: Yes ?07/10/21 ABI: run L 0.92 ?Pain Present Now: No ?Electronic Signature(s) ?Signed: 09/11/2021 2:09:01 PM By: Deon Pilling RN, [name] ?Entered By: Deon Pilling on 09/11/2021 11:38:42 ?-------------------------------------------------------------------------------- ?Compression Therapy Details ?Patient Name: Date of Service: ?RO [place], [place]. 09/11/2021 11:00 A M ?Medical Record Number: [id number] ?Patient Account Number: [id number] ?Date of Birth/Sex: Treating RN: ?1955/05/28 (66 y.o. M) [name], [name] ?Primary Care Shann Lewellyn: Chana Bode Other Clinician: ?Referring Jaretzi Droz: ?Treating Stefanny Pieri/Extender:  Kalman Shan ?Chana Bode ?Weeks in Treatment: 13 ?Compression Therapy Performed for Wound Assessment: Wound #1 Left,Lateral Lower Leg ?Performed By: Clinician Deon Pilling, RN ?Compression Type: Three Layer ?Post Procedure Diagnosis ?Same as Pre-procedure ?Electronic Signature(s) ?Signed: 09/11/2021 2:09:01 PM By: Deon Pilling RN, [name] ?Entered By: Deon Pilling on 09/11/2021 12:04:37 ?-------------------------------------------------------------------------------- ?Encounter Discharge Information Details ?Patient Name: ?Date of Service: ?RO [place], [place] O. 09/11/2021 11:00 A M ?Medical Record Number: [id number] ?Patient Account Number: [id number] ?Date of Birth/Sex: ?Treating RN: ?Feb 28, 1956 (66 y.o. M) [name], [name] ?Primary Care Jaanvi Fizer: Chana Bode ?Other Clinician: ?Referring Zakyah Yanes: ?Treating Jaritza Duignan/Extender: Kalman Shan ?Chana Bode ?Weeks in Treatment: 13 ?Encounter Discharge Information Items ?Discharge Condition: Stable ?Ambulatory Status: Ambulatory ?Discharge Destination: Home ?Transportation: Private Auto ?Accompanied By: self ?Schedule Follow-up Appointment: Yes ?Clinical Summary of Care: ?Electronic Signature(s) ?Signed: 09/11/2021 2:09:01 PM By: Deon Pilling RN, [name] ?Entered By: Deon Pilling on 09/11/2021 12:06:03 ?-------------------------------------------------------------------------------- ?Lower Extremity Assessment Details ?Patient Name: ?Date of Service: ?RO [place], [place] O. 09/11/2021 11:00 A M ?Medical Record Number: [id number] ?Patient Account Number: [id number] ?Date of Birth/Sex: ?Treating RN: ?Nov 19, 1955 (66 y.o. M) [name], [name] ?Primary Care Damarie Schoolfield: Chana Bode ?Other Clinician: ?Referring Milah Recht: ?Treating Dulcy Sida/Extender: Kalman Shan ?Chana Bode ?Weeks in Treatment: 13 ?Edema Assessment ?Assessed: [Left: Yes] [Right: No] ?Edema: [Left: N] [Right: o] ?Calf ?Left: Right: ?Point of Measurement: 41 cm From Medial Instep 46 cm ?Ankle ?Left:  Right: ?Point of Measurement: 15 cm From Medial Instep 21 cm ?Vascular Assessment ?Pulses: ?Dorsalis Pedis ?Palpable: [Left:Yes] ?Electronic Signature(s) ?Signed: 09/11/2021 2:09:01 PM By: Deon Pilling RN, [name] ?Entered By: Deon Pilling on 09/11/2021 11:41:28 ?-------------------------------------------------------------------------------- ?Multi Wound Chart Details ?Patient Name: ?Date of Service: ?RO [place], [place] O. 09/11/2021 11:00 A M ?Medical Record Number: [id number] ?Patient Account Number: [id number] ?Date of Birth/Sex: ?Treating RN: ?04-06-1956 (66 y.o. M) [name], [name] ?Primary Care Michala Deblanc: Chana Bode ?Other Clinician: ?Referring Raney Koeppen: ?Treating Salahuddin Arismendez/Extender: Kalman Shan ?Chana Bode ?Weeks in Treatment:  13 ?Vital Signs ?Height(in): 75 ?Pulse(bpm): 80 ?Weight(lbs): 400 ?Blood Pressure(mmHg): 157/98 ?Body Mass Index(BMI): 50 ?Temperature(??F): 98.5 ?Respiratory Rate(breaths/min): 20 ?Photos: [N/A:N/A] ?Left, Lateral Lower Leg N/A N/A ?Wound Location: ?Gradually Appeared N/A N/A ?Wounding Event: ?Venous Leg Ulcer N/A N/A ?Primary Etiology: ?Lymphedema N/A N/A ?Secondary Etiology: ?Hypertension N/A N/A ?Comorbid History: ?02/14/2021 N/A N/A ?Date Acquired: ?[birth year] N/A N/A ?Weeks of Treatment: ?Open N/A N/A ?Wound Status: ?No N/A N/A ?Wound Recurrence: ?Yes N/A N/A ?Clustered Wound: ?1 N/A N/A ?Clustered Quantity: ?0.2x0.2x0.1 N/A N/A ?Measurements L x W x D (cm) ?0.031 N/A N/A ?A (cm?) : ?rea ?0.003 N/A N/A ?Volume (cm?) : ?99.90% N/A N/A ?% Reduction in A rea: ?100.00% N/A N/A ?% Reduction in Volume: ?Full Thickness Without Exposed N/A N/A ?Classification: ?Support Structures ?None Present N/A N/A ?Exudate A mount: ?Distinct, outline attached N/A N/A ?Wound Margin: ?None Present (0%) N/A N/A ?Granulation Amount: ?Large (67-100%) N/A N/A ?Necrotic Amount: ?Eschar N/A N/A ?Necrotic Tissue: ?Fat Layer (Subcutaneous Tissue): Yes N/A N/A ?Exposed Structures: ?Fascia: No ?Tendon: No ?Muscle:  No ?Joint: No ?Bone: No ?Large (67-100%) N/A N/A ?Epithelialization: ?Compression Therapy N/A N/A ?Procedures Performed: ?Treatment Notes ?Wound #1 (Lower Leg) Wound Laterality: Left, Lateral ?Cleanser ?Soap and Water ?Discharge Instruction: May shower and wash wound with dial antibacterial soap and water prior to dressing change. ?Wound Cleanser ?Discharge Instruction: Cleanse the wound with wound cleanser prior to applying a clean dressing using gauze sponges, not tissue or cotton balls. ?Peri-Wound Care ?Sween Lotion (Moisturizing lotion) ?Discharge Instruction: Apply moisturizing lotion as directed ?Topical ?Primary Dressing ?Xeroform Occlusive Gauze Dressing, 4x4 in ?Discharge Instruction: Apply to [facility] topical antibiotics. ?Secondary Dressing ?Woven Gauze Sponge, Non-Sterile 4x4 in ?Discharge Instruction: Apply over primary dressing as directed. ?Secured With ?Compression Wrap ?ThreePress (3 layer compression wrap) ?Discharge Instruction: Apply three layer compression as directed. ?Compression Stockings ?Add-Ons ?Electronic Signature(s) ?Signed: 09/15/2021 4:59:59 PM By: Deon Pilling RN, [name] ?Signed: 09/15/2021 5:07:08 PM By: Kalman Shan DO ?Entered By: Kalman Shan on 09/15/2021 16:46:32 ?-------------------------------------------------------------------------------- ?Multi-Disciplinary Care Plan Details ?Patient Name: ?Date of Service: ?RO [place], [place] O. 09/11/2021 11:00 A M ?Medical Record Number: [id number] ?Patient Account Number: [id number] ?Date of Birth/Sex: ?Treating RN: ?03/04/1956 (66 y.o. M) [name], [name] ?Primary Care Nohemi Nicklaus: Chana Bode ?Other Clinician: ?Referring Jaleigh Mccroskey: ?Treating Marge Vandermeulen/Extender: Kalman Shan ?Chana Bode ?Weeks in Treatment: 13 ?Active Inactive ?Venous Leg Ulcer ?Nursing Diagnoses: ?Potential for venous Insuffiency (use before diagnosis confirmed) ?Goals: ?Patient will maintain optimal edema control ?Date Initiated: 06/11/2021 ?Target Resolution Date:  10/02/2021 ?Goal Status: Active ?Patient/caregiver will verbalize understanding of disease process and disease management ?Date Initiated: 06/11/2021 ?Target Resolution Date: 10/02/2021 ?Goal Status: Active ?Interventions

## 2021-09-15 NOTE — Progress Notes (Signed)
PAPA, PIERCEFIELD ([id number]) ?Visit Report for 09/11/2021 ?Chief Complaint Document Details ?Patient Name: Date of Service: ?[name], [name] 09/11/2021 11:00 A M ?Medical Record Number: [id number] ?Patient Account Number: [id number] ?Date of Birth/Sex: Treating RN: ?1955-12-12 (66 y.o. M) Elesa Porter, Ryan ?Primary Care Provider: Crosby Porter Other Clinician: ?Referring Provider: ?Treating Provider/Extender: [name] ?[name] ?Weeks in Treatment: 13 ?Information Obtained from: Patient ?Chief Complaint ?Left lower extremity wounds ?Electronic Signature(s) ?Signed: 09/15/2021 5:07:08 PM By: Ryan Corwin DO ?Entered By: [name] on 09/15/2021 16:46:45 ?-------------------------------------------------------------------------------- ?HPI Details ?Patient Name: Date of Service: ?[name], [name] 09/11/2021 11:00 A M ?Medical Record Number: [id number] ?Patient Account Number: [id number] ?Date of Birth/Sex: Treating RN: ?[birth year] (66 y.o. M) Elesa Porter, Ryan ?Primary Care Provider: Crosby Porter Other Clinician: ?Referring Provider: ?Treating Provider/Extender: [name] ?[name] ?Weeks in Treatment: 13 ?History of Present Illness ?HPI Description: Admission 06/11/2021 ?[name], [name] is a 66 year old male with a past medical history of chronic venous insufficiency and essential hypertension that presents to the clinic ?for a 19-month history of nonhealing ulcer to his left lower extremity. He thinks it started out as a bug bite and he scratched it. He has currently been keeping ?the area covered. He reports chronic pain to the wound bed. He has been given Keflex for this issue. He denies purulent drainage. He denies systemic signs ?of infection. 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 He is reported to not be a ?candidate for ablation due to morbid obesity and inability to wear compression. ?2/16; patient presents  for follow-up. He has no issues or complaints today. He states he tolerated the compression wrap well. He continues to report chronic ?pain to the wound site. ?2/23; patient presents for follow-up. He has no issues or complaints today. He reports improvement in his chronic pain to the wound site. He states that the ?[facility] antibiotics is arriving this week. He currently denies systemic signs of infection. ?3/2; patient presents for follow-up. He has been using Hydrofera Blue and gentamicin ointment to the wound bed without any issues. He reports an ?improvement in chronic pain to the wound bed. He obtained the [facility] antibiotic but has not started it yet. He brought this in today. ?07/09/2021: Wounds continue to decrease in size. They are less painful and he is able to tolerate debridement better. He has been using [facility] antibiotic ?under Hydrofera Blue with Kerlix and Coban wrapping. We had not been able to get formal arterial studies to try and apply greater compression, but he is ?scheduled to undergo this tomorrow. Hopefully that will allow [place] to use 3 or 4 layer compression which I think he really needs. No concern for infection. ?3/16; patient presents for follow-up. He has been using [facility] antibiotics with Hydrofera Blue. He has no issues or complaints today. He was able to obtain ?his ABIs. Resting ABI was 1.27 and TBI was 0.72. It was noted to have normal arterial Doppler exam of the bilateral lower extremities. There is no evidence of ?lower extremity arterial occlusion. ?3/24; patient presents for follow-up. He has no issues or complaints today. He has been using [facility] antibiotics with Hydrofera Blue under 3 layer ?compression. The Hydrofera Blue was sticking to the wound bed when the wrap was removed today. Patient denies signs of infection. He reports improvement ?in his chronic pain to the wound bed ?3/31; patient presents for follow-up. He has tolerated the compression wrap  well. He has no  issues or complaints today. ?4/6; patient presents for follow-up. He has no issues or complaints today. He is doing well with [facility] antibiotics and Sorbact under 3 layer compression. ?4/13; venous insufficiency wounds on the left lower leg in the setting of chronic venous insufficiency and lymphedema. Using [place] and [address] One of ?his 2 open areas is healed the other appears smaller ?4/20; patient presents for follow-up. He has 1 remaining wound. He has no issues or complaints today. He denies signs of infection. ?4/28; left lateral lower leg venous insufficiency. Apparently the collagen was stuck to the wound otherwise things look really healthy here. He has stockings in ?waiting for the eventuality that this actually closes ?5/4; patient presents for follow-up. Collagen under 3 layer compression is being used. He has no issues or complaints today. ?5/16; patient presents for follow-up. He has been using Xeroform under 3 layer compression without issues. He denies signs of infection. ?Electronic Signature(s) ?Signed: 09/15/2021 5:07:08 PM By: Ryan Corwin DO ?Entered By: [name] on 09/15/2021 16:47:07 ?-------------------------------------------------------------------------------- ?Physical Exam Details ?Patient Name: Date of Service: ?[name], [name] 09/11/2021 11:00 A M ?Medical Record Number: [id number] ?Patient Account Number: [id number] ?Date of Birth/Sex: Treating RN: ?18-Aug-1955 (65 y.o. M) Elesa Porter, Ryan ?Primary Care Provider: Crosby Porter Other Clinician: ?Referring Provider: ?Treating Provider/Extender: [name] ?[name] ?Weeks in Treatment: 13 ?Constitutional ?respirations regular, non-labored and within target range for patient.Marland Kitchen ?Cardiovascular ?2+ dorsalis pedis/posterior tibialis pulses. ?Psychiatric ?pleasant and cooperative. ?Notes ?Left lower extremity: T the distal lateral aspect there is a very small open wound with granulation tissue. No signs of  surrounding infection. ?o ?Electronic Signature(s) ?Signed: 09/15/2021 5:07:08 PM By: Ryan Corwin DO ?Entered By: [name] on 09/15/2021 17:04:27 ?-------------------------------------------------------------------------------- ?Physician Orders Details ?Patient Name: Date of Service: ?RO [place], [name] 09/11/2021 11:00 A M ?Medical Record Number: [id number] ?Patient Account Number: [id number] ?Date of Birth/Sex: Treating RN: ?07-12-1955 (66 y.o. 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 - Thursday 5/18/223 1115 Dr. Mikey Bussing and Yvonne Kendall, Room 8 ?Return A ?Other: - Bring in stockings weekly to appt time. ?Bathing/ Shower/ Hygiene ?May shower with protection but do not get wound dressing(s) wet. ?Edema Control - Lymphedema / SCD / Other ?Elevate legs to the level of the heart or above for 30 minutes daily and/or when sitting, a frequency of: - 3-4 times throughout the day. ?Avoid standing for long periods of time. ?Exercise regularly ?Moisturize legs daily. - right leg every night before bed. ?Compression stocking or Garment 20-30 mm/Hg pressure to: - bring in weekly. ?Additional Orders / Instructions ?Follow Nutritious Diet ?Wound Treatment ?Wound #1 - Lower Leg Wound Laterality: Left, Lateral ?Cleanser: Soap and Water 1 x Per Week/30 Days ?Discharge Instructions: May shower and wash wound with dial antibacterial soap and water prior to dressing change. ?Cleanser: Wound Cleanser 1 x Per Week/30  Days ?Discharge Instructions: Cleanse the wound with wound cleanser prior to applying a clean dressing using gauze sponges, not tissue or cotton balls. ?Peri-Wound Care: Sween Lotion (Moisturizing lotion) 1 x Per

## 2021-09-17 ENCOUNTER — Encounter (HOSPITAL_BASED_OUTPATIENT_CLINIC_OR_DEPARTMENT_OTHER): Payer: PRIVATE HEALTH INSURANCE | Admitting: Internal Medicine

## 2021-09-17 ENCOUNTER — Other Ambulatory Visit: Payer: Self-pay

## 2021-09-17 DIAGNOSIS — I10 Essential (primary) hypertension: Secondary | ICD-10-CM

## 2021-09-17 DIAGNOSIS — I890 Lymphedema, not elsewhere classified: Secondary | ICD-10-CM

## 2021-09-17 DIAGNOSIS — I87312 Chronic venous hypertension (idiopathic) with ulcer of left lower extremity: Secondary | ICD-10-CM

## 2021-09-17 DIAGNOSIS — L97822 Non-pressure chronic ulcer of other part of left lower leg with fat layer exposed: Secondary | ICD-10-CM

## 2021-09-17 MED ORDER — METFORMIN HCL 500 MG PO TABS: ORAL_TABLET | ORAL | 1 refills | Status: DC

## 2021-09-22 NOTE — Progress Notes (Signed)
MOHAMMEDALI, KORSAK ([id number]) Visit Report for 09/17/2021 Chief Complaint Document Details Patient Name: Date of Service: [place], [name] 09/17/2021 11:15 A M Medical Record Number: [id number] Patient Account Number: [id number] Date of Birth/Sex: Treating RN: 06-Mar-1956 (66 y.o. [name] Primary Care Provider: [name] Other Clinician: Referring Provider: Treating Provider/Extender: Derek Jack in Treatment: 14 Information Obtained from: Patient Chief Complaint Left lower extremity wounds Electronic Signature(s) Signed: 09/17/2021 12:25:26 PM By: [name] DO Entered By: [name] on 09/17/2021 12:22:38 -------------------------------------------------------------------------------- HPI Details Patient Name: Date of Service: [name], [place]. 09/17/2021 11:15 A M Medical Record Number: [id number] Patient Account Number: [id number] Date of Birth/Sex: Treating RN: 1956/03/04 (66 y.o. [name] Primary Care Provider: [name] Other Clinician: Referring Provider: Treating Provider/Extender: Derek Jack in Treatment: 14 History of Present Illness HPI Description: Admission 06/11/2021 Mr. Deshone Frieder, [name] is a 66 year old male with a past medical history of chronic venous insufficiency and essential hypertension that presents to the clinic for a 80-month history of nonhealing ulcer to his left lower extremity. He thinks it started out as a bug bite and he scratched it. He has currently been keeping the area covered. He reports chronic pain to the wound bed. He has been given Keflex for this issue. He denies purulent drainage. He denies systemic signs of infection. 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 He is reported to not be a candidate for ablation due to morbid obesity and inability to wear compression. 2/16; patient presents  for follow-up. He has no issues or complaints today. He states he tolerated the compression wrap well. He continues to report chronic pain to the wound site. 2/23; patient presents for follow-up. He has no issues or complaints today. He reports improvement in his chronic pain to the wound site. He states that the [facility] antibiotics is arriving this week. He currently denies systemic signs of infection. 3/2; patient presents for follow-up. He has been using Hydrofera Blue and gentamicin ointment to the wound bed without any issues. He reports an improvement in chronic pain to the wound bed. He obtained the [facility] antibiotic but has not started it yet. He brought this in today. 07/09/2021: Wounds continue to decrease in size. They are less painful and he is able to tolerate debridement better. He has been using Keystone antibiotic under [employer]	[place] with Kerlix and Coban wrapping. We had not been able to get formal arterial studies to try and apply greater compression, but he is scheduled to undergo this tomorrow. Hopefully that will allow [place] to use 3 or 4 layer compression which I think he really needs. No concern for infection. 3/16; patient presents for follow-up. He has been using Keystone antibiotics with Hydrofera Blue. He has no issues or complaints today. He was able to obtain his ABIs. Resting ABI was 1.27 and TBI was 0.72. It was noted to have normal arterial Doppler exam of the bilateral lower extremities. There is no evidence of lower extremity arterial occlusion. 3/24; patient presents for follow-up. He has no issues or complaints today. He has been using Keystone antibiotics with Hydrofera Blue under 3 layer compression. The Hydrofera Blue was sticking to the wound bed when the wrap was removed today. Patient denies signs of infection. He reports improvement in his chronic pain to the wound bed 3/31; patient presents for follow-up. He has tolerated the compression wrap  well. He has no  issues or complaints today. 4/6; patient presents for follow-up. He has no issues or complaints today. He is doing well with Keystone antibiotics and Sorbact under 3 layer compression. 4/13; venous insufficiency wounds on the left lower leg in the setting of chronic venous insufficiency and lymphedema. Using [facility] and [address] One of his 2 open areas is healed the other appears smaller 4/20; patient presents for follow-up. He has 1 remaining wound. He has no issues or complaints today. He denies signs of infection. 4/28; left lateral lower leg venous insufficiency. Apparently the collagen was stuck to the wound otherwise things look really healthy here. He has stockings in waiting for the eventuality that this actually closes 5/4; patient presents for follow-up. Collagen under 3 layer compression is being used. He has no issues or complaints today. 5/16; patient presents for follow-up. He has been using Xeroform under 3 layer compression without issues. He denies signs of infection. 5/18; patient presents for follow-up. He has been using Xeroform under 3 layer compression. He has no issues or complaints today. He brought his compression stockings. Electronic Signature(s) Signed: 09/17/2021 12:25:26 PM By: [name] DO Entered By: [name] on 09/17/2021 12:22:58 -------------------------------------------------------------------------------- Physical Exam Details Patient Name: Date of Service: [name], [name] 09/17/2021 11:15 A M Medical Record Number: [id number] Patient Account Number: [id number] Date of Birth/Sex: Treating RN: 10/30/1955 (66 y.o. [name] Primary Care Provider: [name] Other Clinician: Referring Provider: Treating Provider/Extender: Derek Jack in Treatment: 14 Constitutional respirations regular, non-labored and within target range for patient.. Cardiovascular 2+ dorsalis pedis/posterior tibialis  pulses. Psychiatric pleasant and cooperative. Notes Left lower extremity: No open wounds present epithelization to the previous wound site. No signs of surrounding infection. Electronic Signature(s) Signed: 09/17/2021 12:25:26 PM By: [name] DO Entered By: [name] on 09/17/2021 12:23:27 -------------------------------------------------------------------------------- Physician Orders Details Patient Name: Date of Service: [name], [name] 09/17/2021 11:15 A M Medical Record Number: [id number] Patient Account Number: [id number] Date of Birth/Sex: Treating RN: 1955-07-31 (66 y.o. [name] Primary Care Provider: [name] Other Clinician: Referring Provider: Treating Provider/Extender: Derek Jack in Treatment: 14 Verbal / Phone Orders: No Diagnosis Coding Discharge From [facility] Discharge from Wound Care Center - Call clinic with any concerns or if wound reopens Bathing/ Shower/ Hygiene Other Bathing/Shower/Hygiene Orders/Instructions: - moisturize legs after shower or bath: daily Edema Control - Lymphedema / SCD / Other Elevate legs to the level of the heart or above for 30 minutes daily and/or when sitting, a frequency of: Avoid standing for long periods of time. Patient to wear own compression stockings every day. - Every day! Exercise regularly Moisturize legs daily. Electronic Signature(s) Signed: 09/17/2021 12:43:38 PM By: Redmond Pulling RN, [name] Signed: 09/17/2021 1:47:26 PM By: [name] DO Previous Signature: 09/17/2021 12:25:26 PM Version By: [name] DO Entered By: Redmond Pulling on 09/17/2021 12:36:47 -------------------------------------------------------------------------------- Problem List Details Patient Name: Date of Service: [place], [name] 09/17/2021 11:15 A M Medical Record Number: [id number] Patient Account Number: [id number] Date of Birth/Sex: Treating RN: Oct 23, 1955 (66 y.o. [name] Primary Care Provider: [name] Other Clinician: Referring Provider: Treating Provider/Extender: Derek Jack in Treatment: 14 Active Problems ICD-10 Encounter Code Description Active Date MDM Diagnosis [phone] Non-pressure chronic ulcer of other part of left lower leg with fat layer exposed2/01/2022 No Yes I89.0 Lymphedema, not elsewhere classified 06/11/2021 No Yes I87.312 Chronic venous hypertension (idiopathic)  with ulcer of left lower extremity 06/11/2021 No Yes I10 Essential (primary) hypertension 06/11/2021 No Yes Inactive Problems Resolved Problems Electronic Signature(s) Signed: 09/17/2021 12:25:26 PM By: [name] DO Entered By: [name] on 09/17/2021 12:22:30 -------------------------------------------------------------------------------- Progress Note Details Patient Name: Date of Service: [name], [place]. 09/17/2021 11:15 A M Medical Record Number: [id number] Patient Account Number: [id number] Date of Birth/Sex: Treating RN: 1955/10/13 (66 y.o. [name] Primary Care Provider: [name] Other Clinician: Referring Provider: Treating Provider/Extender: Derek Jack in Treatment: 14 Subjective Chief Complaint Information obtained from Patient Left lower extremity wounds History of Present Illness (HPI) Admission 06/11/2021 Mr. Attila Mccarthy, [name] is a 66 year old male with a past medical history of chronic venous insufficiency and essential hypertension that presents to the clinic for a 4-month history of nonhealing ulcer to his left lower extremity. He thinks it started out as a bug bite and he scratched it. He has currently been keeping the area covered. He reports chronic pain to the wound bed. He has been given Keflex for this issue. He denies purulent drainage. He denies systemic signs of infection. He saw a vein and vascular and they did reflux studies that showed a dilated  incompetent great saphenous vein. He is reported to not be a candidate for ablation due to morbid obesity and inability to wear compression. 2/16; patient presents for follow-up. He has no issues or complaints today. He states he tolerated the compression wrap well. He continues to report chronic pain to the wound site. 2/23; patient presents for follow-up. He has no issues or complaints today. He reports improvement in his chronic pain to the wound site. He states that the [facility] antibiotics is arriving this week. He currently denies systemic signs of infection. 3/2; patient presents for follow-up. He has been using Hydrofera Blue and gentamicin ointment to the wound bed without any issues. He reports an improvement in chronic pain to the wound bed. He obtained the [facility] antibiotic but has not started it yet. He brought this in today. 07/09/2021: Wounds continue to decrease in size. They are less painful and he is able to tolerate debridement better. He has been using Keystone antibiotic under [employer]	[place] with Kerlix and Coban wrapping. We had not been able to get formal arterial studies to try and apply greater compression, but he is scheduled to undergo this tomorrow. Hopefully that will allow [place] to use 3 or 4 layer compression which I think he really needs. No concern for infection. 3/16; patient presents for follow-up. He has been using [facility] antibiotics with Hydrofera Blue. He has no issues or complaints today. He was able to obtain his ABIs. Resting ABI was 1.27 and TBI was 0.72. It was noted to have normal arterial Doppler exam of the bilateral lower extremities. There is no evidence of lower extremity arterial occlusion. 3/24; patient presents for follow-up. He has no issues or complaints today. He has been using [facility] antibiotics with Hydrofera Blue under 3 layer compression. The Hydrofera Blue was sticking to the wound bed when the wrap was removed today. Patient denies signs of  infection. He reports improvement in his chronic pain to the wound bed 3/31; patient presents for follow-up. He has tolerated the compression wrap well. He has no issues or complaints today. 4/6; patient presents for follow-up. He has no issues or complaints today. He is doing well with Keystone antibiotics and Sorbact under 3 layer compression. 4/13; venous insufficiency wounds on the left lower leg  in the setting of chronic venous insufficiency and lymphedema. Using [facility] and [address] One of his 2 open areas is healed the other appears smaller 4/20; patient presents for follow-up. He has 1 remaining wound. He has no issues or complaints today. He denies signs of infection. 4/28; left lateral lower leg venous insufficiency. Apparently the collagen was stuck to the wound otherwise things look really healthy here. He has stockings in waiting for the eventuality that this actually closes 5/4; patient presents for follow-up. Collagen under 3 layer compression is being used. He has no issues or complaints today. 5/16; patient presents for follow-up. He has been using Xeroform under 3 layer compression without issues. He denies signs of infection. 5/18; patient presents for follow-up. He has been using Xeroform under 3 layer compression. He has no issues or complaints today. He brought his compression stockings. Patient History Information obtained from Patient. Family History Cancer - Father,Mother. Social History Never smoker, Marital Status - Married, Alcohol Use - Moderate, Drug Use - No History, Caffeine Use - Never. Medical History Respiratory Denies history of Aspiration, Asthma, Chronic Obstructive Pulmonary Disease (COPD), Pneumothorax, Sleep Apnea, Tuberculosis Cardiovascular Patient has history of Hypertension Denies history of Angina, Arrhythmia, Congestive Heart Failure, Coronary Artery Disease, Deep Vein Thrombosis, Hypotension, Myocardial Infarction, Peripheral Arterial Disease,  Peripheral Venous Disease, Phlebitis, Vasculitis Medical A Surgical History Notes nd Psychiatric Obesity >400lbs Objective Constitutional respirations regular, non-labored and within target range for patient.. Vitals Time Taken: 11:25 AM, Height: 75 in, Weight: 400 lbs, BMI: 50, Temperature: 98.3 F, Pulse: 78 bpm, Respiratory Rate: 18 breaths/min, Blood Pressure: 158/98 mmHg. Cardiovascular 2+ dorsalis pedis/posterior tibialis pulses. Psychiatric pleasant and cooperative. General Notes: Left lower extremity: No open wounds present epithelization to the previous wound site. No signs of surrounding infection. Integumentary (Hair, Skin) Wound #1 status is Open. Original cause of wound was Gradually Appeared. The date acquired was: 02/14/2021. The wound has been in treatment 14 weeks. The wound is located on the Left,Lateral Lower Leg. The wound measures 0cm length x 0cm width x 0cm depth; 0cm^2 area and 0cm^3 volume. There is Fat Layer (Subcutaneous Tissue) exposed. There is no tunneling or undermining noted. There is a none present amount of drainage noted. The wound margin is distinct with the outline attached to the wound base. There is no granulation within the wound bed. There is no necrotic tissue within the wound bed. Assessment Active Problems ICD-10 Non-pressure chronic ulcer of other part of left lower leg with fat layer exposed Lymphedema, not elsewhere classified Chronic venous hypertension (idiopathic) with ulcer of left lower extremity Essential (primary) hypertension Patient has done well with Xeroform under 3 layer compression. His wounds have healed. I recommended compression stockings daily. He knows to call with any questions or concerns. He can follow-up as needed. Plan Discharge From [facility] Services: Discharge from Wound Care Center - Call clinic with any concerns or if wound reopens Bathing/ Shower/ Hygiene: Other Bathing/Shower/Hygiene Orders/Instructions: -  moisturize legs after shower or bath: daily Edema Control - Lymphedema / SCD / Other: Elevate legs to the level of the heart or above for 30 minutes daily and/or when sitting, a frequency of: Avoid standing for long periods of time. Patient to wear own compression stockings every day. - Every day! Exercise regularly Moisturize legs daily. 1. Daily compression stockings 2. Follow-up as needed 3. Discharge from clinic due to closed wound Electronic Signature(s) Signed: 09/17/2021 12:25:26 PM By: [name] DO Entered By: [name] on 09/17/2021 12:24:18 -------------------------------------------------------------------------------- HxROS  Details Patient Name: Date of Service: Renetta Chalk 09/17/2021 11:15 A M Medical Record Number: [id number] Patient Account Number: [id number] Date of Birth/Sex: Treating RN: Mar 07, 1956 (66 y.o. [name] Primary Care Provider: [name] Other Clinician: Referring Provider: Treating Provider/Extender: Derek Jack in Treatment: 14 Information Obtained From Patient Respiratory Medical History: Negative for: Aspiration; Asthma; Chronic Obstructive Pulmonary Disease (COPD); Pneumothorax; Sleep Apnea; Tuberculosis Cardiovascular Medical History: Positive for: Hypertension Negative for: Angina; Arrhythmia; Congestive Heart Failure; Coronary Artery Disease; Deep Vein Thrombosis; Hypotension; Myocardial Infarction; Peripheral Arterial Disease; Peripheral Venous Disease; Phlebitis; Vasculitis Psychiatric Medical History: Past Medical History Notes: Obesity >400lbs Immunizations Pneumococcal Vaccine: Received Pneumococcal Vaccination: No Implantable Devices None Family and Social History Cancer: Yes - Father,Mother; Never smoker; Marital Status - Married; Alcohol Use: Moderate; Drug Use: No History; Caffeine Use: Never; Financial Concerns: No; Food, Clothing or Shelter Needs: No; Support  System Lacking: No; Transportation Concerns: No Electronic Signature(s) Signed: 09/17/2021 12:25:26 PM By: [name] DO Signed: 09/22/2021 4:34:51 PM By: [name], [name] Entered By: [name] on 09/17/2021 12:23:03 -------------------------------------------------------------------------------- SuperBill Details Patient Name: Date of Service: [name], [place] 09/17/2021 Medical Record Number: [id number] Patient Account Number: [id number] Date of Birth/Sex: Treating RN: 04-Jul-1955 (66 y.o. [name], [place] Primary Care Provider: [name] Other Clinician: Referring Provider: Treating Provider/Extender: Derek Jack in Treatment: 14 Diagnosis Coding ICD-10 Codes Code Description [phone] Non-pressure chronic ulcer of other part of left lower leg with fat layer exposed I89.0 Lymphedema, not elsewhere classified I87.312 Chronic venous hypertension (idiopathic) with ulcer of left lower extremity I10 Essential (primary) hypertension Facility Procedures CPT4 Code: [id number] Description: 212 - WOUND CARE VISIT-LEV 2 EST PT Modifier: 25 1 Quantity: Physician Procedures : CPT4 Code Description Modifier [id number] - WC PHYS LEVEL 3 - EST PT ICD-10 Diagnosis Description L97.822 Non-pressure chronic ulcer of other part of left lower leg with fat layer exposed I89.0 Lymphedema, not elsewhere classified I87.312 Chronic  venous hypertension (idiopathic) with ulcer of left lower extremity I10 Essential (primary) hypertension Quantity: 1 Electronic Signature(s) Signed: 09/17/2021 12:43:38 PM By: Redmond Pulling RN, [name] Signed: 09/17/2021 1:47:26 PM By: [name] DO Previous Signature: 09/17/2021 12:25:26 PM Version By: [name] DO Entered By: Redmond Pulling on 09/17/2021 12:35:05

## 2021-09-22 NOTE — Progress Notes (Signed)
[name], [name] ([id number]) Visit Report for 09/17/2021 Arrival Information Details Patient Name: Date of Service: [place], [name] 09/17/2021 11:15 A M Medical Record Number: [id number] Patient Account Number: [id number] Date of Birth/Sex: Treating RN: March 31, 1956 (66 y.o. [name] Primary Care Dagmar Adcox: [name] Other Clinician: Referring Krystyl Cannell: Treating Esaiah Wanless/Extender: Derek Jack in Treatment: 14 Visit Information History Since Last Visit Added or deleted any medications: No Patient Arrived: Ambulatory Any new allergies or adverse reactions: No Arrival Time: 11:34 Had a fall or experienced change in No Accompanied By: Self activities of daily living that may affect Transfer Assistance: None risk of falls: Patient Requires Transmission-Based Precautions: No Signs or symptoms of abuse/neglect since last visito No Patient Has Alerts: Yes Hospitalized since last visit: No Patient Alerts: Patient on Blood Thinner Implantable device outside of the clinic excluding No 07/10/2021 TBI L: 065 cellular tissue based products placed in the center 07/10/21 ABI: rest L 1.34 since last visit: 07/10/21 ABI: run L 0.92 Has Dressing in Place as Prescribed: Yes Has Compression in Place as Prescribed: Yes Pain Present Now: No Electronic Signature(s) Signed: 09/17/2021 12:43:38 PM By: Redmond Pulling RN, [name] Entered By: Redmond Pulling on 09/17/2021 11:35:30 -------------------------------------------------------------------------------- Clinic Level of Care Assessment Details Patient Name: Date of Service: [place], [name] 09/17/2021 11:15 A M Medical Record Number: [id number] Patient Account Number: [id number] Date of Birth/Sex: Treating RN: Dec 16, 1955 (66 y.o. [name] Primary Care Mariadejesus Cade: [name] Other Clinician: Referring Miro Balderson: Treating Jaz Mallick/Extender: Derek Jack in Treatment: 14 Clinic  Level of Care Assessment Items TOOL 4 Quantity Score X- 1 0 Use when only an EandM is performed on FOLLOW-UP visit ASSESSMENTS - Nursing Assessment / Reassessment X- 1 10 Reassessment of Co-morbidities (includes updates in patient status) X- 1 5 Reassessment of Adherence to Treatment Plan ASSESSMENTS - Wound and Skin A ssessment / Reassessment X - Simple Wound Assessment / Reassessment - one wound 1 5 []  - 0 Complex Wound Assessment / Reassessment - multiple wounds []  - 0 Dermatologic / Skin Assessment (not related to wound area) ASSESSMENTS - Focused Assessment X- 1 5 Circumferential Edema Measurements - multi extremities []  - 0 Nutritional Assessment / Counseling / Intervention []  - 0 Lower Extremity Assessment (monofilament, tuning fork, pulses) []  - 0 Peripheral Arterial Disease Assessment (using hand held doppler) ASSESSMENTS - Ostomy and/or Continence Assessment and Care []  - 0 Incontinence Assessment and Management []  - 0 Ostomy Care Assessment and Management (repouching, etc.) PROCESS - Coordination of Care X - Simple Patient / Family Education for ongoing care 1 15 []  - 0 Complex (extensive) Patient / Family Education for ongoing care X- 1 10 Staff obtains Chiropractor, Records, T Results / Process Orders est []  - 0 Staff telephones HHA, Nursing Homes / Clarify orders / etc []  - 0 Routine Transfer to another Facility (non-emergent condition) []  - 0 Routine Hospital Admission (non-emergent condition) []  - 0 New Admissions / Manufacturing engineer / Ordering NPWT Apligraf, etc. , []  - 0 Emergency Hospital Admission (emergent condition) X- 1 10 Simple Discharge Coordination []  - 0 Complex (extensive) Discharge Coordination PROCESS - Special Needs []  - 0 Pediatric / Minor Patient Management []  - 0 Isolation Patient Management []  - 0 Hearing / Language / Visual special needs []  - 0 Assessment of Community assistance (transportation, D/C planning, etc.) []   - 0 Additional assistance / Altered mentation []  - 0 Support Surface(s) Assessment (bed, cushion, seat, etc.) INTERVENTIONS - Wound  Cleansing / Measurement X - Simple Wound Cleansing - one wound 1 5 []  - 0 Complex Wound Cleansing - multiple wounds []  - 0 Wound Imaging (photographs - any number of wounds) []  - 0 Wound Tracing (instead of photographs) X- 1 5 Simple Wound Measurement - one wound []  - 0 Complex Wound Measurement - multiple wounds INTERVENTIONS - Wound Dressings []  - 0 Small Wound Dressing one or multiple wounds []  - 0 Medium Wound Dressing one or multiple wounds []  - 0 Large Wound Dressing one or multiple wounds []  - 0 Application of Medications - topical []  - 0 Application of Medications - injection INTERVENTIONS - Miscellaneous []  - 0 External ear exam []  - 0 Specimen Collection (cultures, biopsies, blood, body fluids, etc.) []  - 0 Specimen(s) / Culture(s) sent or taken to Lab for analysis []  - 0 Patient Transfer (multiple staff / / Similar devices) []  - 0 Simple Staple / Suture removal (25 or less) []  - 0 Complex Staple / Suture removal (26 or more) []  - 0 Hypo / Hyperglycemic Management (close monitor of Blood Glucose) []  - 0 Ankle / Brachial Index (ABI) - do not check if billed separately X- 1 5 Vital Signs Has the patient been seen at the hospital within the last three years: Yes Total Score: 75 Level Of Care: New/Established - Level 2 Electronic Signature(s) Signed: 09/17/2021 12:43:38 PM By: RN, [name] Entered By: on 09/17/2021 12:34:47 -------------------------------------------------------------------------------- Encounter Discharge Information Details Patient Name: Date of Service: , . 09/17/2021 11:15 A M Medical Record Number: Patient Account Number: Date of Birth/Sex: Treating RN: 1956/01/17 (66 y.o. Primary Care Sherian Valenza: Nurse, adult Other  Clinician: Referring Gerlad Pelzel: Treating Fredrich Cory/Extender: in Treatment: 14 Encounter Discharge Information Items Discharge Condition: Stable Ambulatory Status: Ambulatory Discharge Destination: Home Transportation: Private Auto Accompanied By: self Schedule Follow-up Appointment: Yes Clinical Summary of Care: Patient Declined Electronic Signature(s) Signed: 09/17/2021 12:43:38 PM By: RN, [name] Entered By: on 09/17/2021 12:37:37 -------------------------------------------------------------------------------- Lower Extremity Assessment Details Patient Name: Date of Service: Redmond Pulling, Redmond Pulling. 09/17/2021 11:15 A M Medical Record Number: French Ana Patient Account Number: Vikki Ports Date of Birth/Sex: Treating RN: Feb 15, 1956 (66 y.o. [id number] Primary Care Masato Pettie: 01/17/1956 Other Clinician: Referring Jaya Lapka: Treating Pearline Yerby/Extender: 66 in Treatment: 14 Edema Assessment Assessed: [name]: No] [name]: No] Edema: [Left: N] [Right: o] Calf Left: Right: Point of Measurement: 41 cm From Medial Instep 46 cm Ankle Left: Right: Point of Measurement: 15 cm From Medial Instep 27.3 cm Vascular Assessment Pulses: Dorsalis Pedis Palpable: [Left:Yes] Electronic Signature(s) Signed: 09/17/2021 12:43:38 PM By: 09/19/2021 RN, [name] Entered By: Redmond Pulling on 09/17/2021 11:37:27 -------------------------------------------------------------------------------- Multi Wound Chart Details Patient Name: Date of Service: 09/19/2021, French Ana. 09/17/2021 11:15 A M Medical Record Number: 09/19/2021 Patient Account Number: [id number] Date of Birth/Sex: Treating RN: July 19, 1955 (66 y.o. 64 Primary Care Keari Miu: [name] Other Clinician: Referring Sarahann Horrell: Treating Joab Carden/Extender: [name] in Treatment: 14 Vital  Signs Height(in): 75 Pulse(bpm): 78 Weight(lbs): 400 Blood Pressure(mmHg): 158/98 Body Mass Index(BMI): 50 Temperature(F): 98.3 Respiratory Rate(breaths/min): 18 Photos: [N/A:N/A] Left, Lateral Lower Leg N/A N/A Wound Location: Gradually Appeared N/A N/A Wounding Event: Venous Leg Ulcer N/A N/A Primary Etiology: Lymphedema N/A N/A Secondary Etiology: Hypertension N/A N/A Comorbid History: 02/14/2021 N/A N/A Date Acquired: 14 N/A N/A Weeks of Treatment: Open N/A  N/A Wound Status: No N/A N/A Wound Recurrence: Yes N/A N/A Clustered Wound: 1 N/A N/A Clustered Quantity: 0x0x0 N/A N/A Measurements L x W x D (cm) 0 N/A N/A A (cm) : rea 0 N/A N/A Volume (cm) : 100.00% N/A N/A % Reduction in A rea: 100.00% N/A N/A % Reduction in Volume: Full Thickness Without Exposed N/A N/A Classification: Support Structures None Present N/A N/A Exudate Amount: Distinct, outline attached N/A N/A Wound Margin: None Present (0%) N/A N/A Granulation Amount: None Present (0%) N/A N/A Necrotic Amount: Fat Layer (Subcutaneous Tissue): Yes N/A N/A Exposed Structures: Fascia: No Tendon: No Muscle: No Joint: No Bone: No Large (67-100%) N/A N/A Epithelialization: Treatment Notes Electronic Signature(s) Signed: 09/17/2021 12:25:26 PM By: [name] DO Signed: 09/22/2021 4:34:51 PM By: [name], [name] Entered By: [name] on 09/17/2021 12:22:33 -------------------------------------------------------------------------------- Multi-Disciplinary Care Plan Details Patient Name: Date of Service: [place], [name] 09/17/2021 11:15 A M Medical Record Number: [id number] Patient Account Number: [id number] Date of Birth/Sex: Treating RN: 1955-12-04 (66 y.o. [name] Primary Care Tekla Malachowski: [name] Other Clinician: Referring Jessie Schrieber: Treating Carlis Burnsworth/Extender: Derek Jack in Treatment: 14 Active Inactive Electronic  Signature(s) Signed: 09/17/2021 12:43:38 PM By: Redmond Pulling RN, [name] Entered By: Redmond Pulling on 09/17/2021 12:32:43 -------------------------------------------------------------------------------- Pain Assessment Details Patient Name: Date of Service: French Ana, Vikki Ports 09/17/2021 11:15 A M Medical Record Number: [id number] Patient Account Number: [id number] Date of Birth/Sex: Treating RN: 1956/01/31 (66 y.o. [name] Primary Care Oney Folz: [name] Other Clinician: Referring Baila Rouse: Treating Aaleyah Witherow/Extender: Derek Jack in Treatment: 14 Active Problems Location of Pain Severity and Description of Pain Patient Has Paino No Site Locations Pain Management and Medication Current Pain Management: Electronic Signature(s) Signed: 09/17/2021 12:43:38 PM By: Redmond Pulling RN, [name] Entered By: Redmond Pulling on 09/17/2021 11:36:34 -------------------------------------------------------------------------------- Patient/Caregiver Education Details Patient Name: Date of Service: Renetta Chalk 5/18/2023andnbsp11:15 A M Medical Record Number: [id number] Patient Account Number: [id number] Date of Birth/Gender: Treating RN: May 06, 1955 (66 y.o. [name] Primary Care Physician: [name] Other Clinician: Referring Physician: Treating Physician/Extender: Derek Jack in Treatment: 14 Education Assessment Education Provided To: Patient Education Topics Provided Basic Hygiene: Methods: Explain/Verbal Responses: State content correctly Wound/Skin Impairment: Methods: Explain/Verbal Responses: State content correctly Electronic Signature(s) Signed: 09/17/2021 12:43:38 PM By: Redmond Pulling RN, [name] Entered By: Redmond Pulling on 09/17/2021 12:33:13 -------------------------------------------------------------------------------- Wound Assessment Details Patient Name: Date of Service: [place], Vikki Ports 09/17/2021 11:15 A M Medical Record Number: [id number] Patient Account Number: [id number] Date of Birth/Sex: Treating RN: 1956/02/18 (66 y.o. [name] Primary Care Kierria Feigenbaum: [name] Other Clinician: Referring Izac Faulkenberry: Treating Hasnain Manheim/Extender: Derek Jack in Treatment: 14 Wound Status Wound Number: 1 Primary Etiology: Venous Leg Ulcer Wound Location: Left, Lateral Lower Leg Secondary Etiology: Lymphedema Wounding Event: Gradually Appeared Wound Status: Open Date Acquired: 02/14/2021 Comorbid History: Hypertension Weeks Of Treatment: 14 Clustered Wound: Yes Photos Wound Measurements Length: (cm) Width: (cm) Depth: (cm) Clustered Quantity: Area: (cm) Volume: (cm) 0 % Reduction in Area: 100% 0 % Reduction in Volume: 100% 0 Epithelialization: Large (67-100%) 1 Tunneling: No 0 Undermining: No 0 Wound Description Classification: Full Thickness Without Exposed Support Structures Wound Margin: Distinct, outline attached Exudate Amount: None Present Foul Odor After Cleansing: No Slough/Fibrino No Wound Bed Granulation Amount: None Present (0%) Exposed Structure Necrotic Amount: None Present (0%) Fascia Exposed: No Fat Layer (Subcutaneous Tissue) Exposed: Yes Tendon  Exposed: No Muscle Exposed: No Joint Exposed: No Bone Exposed: No Electronic Signature(s) Signed: 09/17/2021 12:43:38 PM By: Redmond Pulling RN, [name] Entered By: Redmond Pulling on 09/17/2021 11:34:37 -------------------------------------------------------------------------------- Vitals Details Patient Name: Date of Service: [name], [place]. 09/17/2021 11:15 A M Medical Record Number: [id number] Patient Account Number: [id number] Date of Birth/Sex: Treating RN: November 03, 1955 (66 y.o. [name] Primary Care Paddy Neis: [name] Other Clinician: Referring Brynlee Pennywell: Treating Maiya Kates/Extender: Derek Jack in  Treatment: 14 Vital Signs Time Taken: 11:25 Temperature (F): 98.3 Height (in): 75 Pulse (bpm): 78 Weight (lbs): 400 Respiratory Rate (breaths/min): 18 Body Mass Index (BMI): 50 Blood Pressure (mmHg): 158/98 Reference Range: 80 - 120 mg / dl Electronic Signature(s) Signed: 09/17/2021 12:43:38 PM By: Redmond Pulling RN, [name] Entered By: Redmond Pulling on 09/17/2021 11:36:18

## 2021-10-13 NOTE — Progress Notes (Signed)
[name], [name] ([id number]) Visit Report for Ryan Arrival Information Details Patient Name: Date of Service: [name], [name] [name] Medical Record Number: [id number] Patient Account Number: [id number] Date of Birth/Sex: Treating Porter: [name] (66 y.o. [name], Mill[place] Primary Care [name]: [name] Other Clinician: Referring [name]: Treating [name]/Extender: [name] in Treatment: 11 Visit Information History Since Last Visit Added or deleted any medications: No Patient Arrived: Ambulatory Any new allergies or adverse reactions: No Arrival Time: 10:41 Had a fall or experienced change in No Accompanied By: self activities of daily living that Ryan affect Transfer Assistance: None risk of falls: Patient Requires Transmission-Based Precautions: No Signs or symptoms of abuse/neglect since last visito No Patient Has Alerts: Yes Hospitalized since last visit: No Patient Alerts: Patient on Blood Thinner Implantable device outside of the clinic excluding No 07/10/2021 [name]: 065 cellular tissue based products placed in the center 07/10/21 [name] since last visit: 07/10/21 [name] Pain Present Now: No Electronic Signature(s) Signed: 10/13/2021 8:47:56 Porter By: [name] Entered By: [name] on [name] 10:42:12 -------------------------------------------------------------------------------- Compression Therapy Details Patient Name: Date of Service: [name] [name] Medical Record Number: [id number] Patient Account Number: [id number] Date of Birth/Sex: Treating Porter: [name] (66 y.o. [name] Primary Care [name]: [name] Other Clinician: Referring [name]: Treating [name]/Extender: [name] in Treatment: 11 Compression Therapy Performed for Wound Assessment: Wound #1 Left,Lateral Lower Leg Performed By: Clinician Ryan Stall, Porter Compression Type: Three Layer Post Procedure Diagnosis Same as Pre-procedure Electronic Signature(s) Signed: 08/28/2021 4:52:29 PM By: [name], [name] Entered By: [name] on [name] 10:58:54 -------------------------------------------------------------------------------- Encounter Discharge Information Details Patient Name: Date of Service: [name], Ryan Ports. [name] Medical Record Number: [id number] Patient Account Number: [id number] Date of Birth/Sex: Treating Porter: [name] (66 y.o. [name] Primary Care [name]: [name] Other Clinician: Referring [name]: Treating [name]/Extender: [name] in Treatment: 11 Encounter Discharge Information Items Discharge Condition: Stable Ambulatory Status: Ambulatory Discharge Destination: Home Transportation: Private Auto Accompanied By: self Schedule Follow-up Appointment: Yes Clinical Summary of Care: Electronic Signature(s) Signed: 08/28/2021 4:52:29 PM By: [name], [name] Entered By: [name] on [name] 11:01:38 -------------------------------------------------------------------------------- Lower Extremity Assessment Details Patient Name: Date of Service: [name], Ryan Ports. [name] Medical Record Number: [id number] Patient Account Number: [id number] Date of Birth/Sex: Treating Porter: [birth year] (66 y.o. [name] Primary Care [name]: [name] Other Clinician: Referring [name]: Treating [name]/Extender: [name] in Treatment: 11 Edema Assessment Assessed: [name]: Yes] [name]: No] Edema: [Left: N] [Right: o] Calf Left: Right: Point of Measurement: 41 cm From Medial Instep 48 cm Ankle Left: Right: Point of Measurement: 15 cm From Medial Instep 27.5 cm Vascular Assessment Pulses: Dorsalis Pedis Palpable: [Left:Yes] Electronic Signature(s) Signed: 08/28/2021 4:52:29 PM  By: [name], [name] Entered By: [name] on [name] 10:52:48 -------------------------------------------------------------------------------- Multi-Disciplinary Care Plan Details Patient Name: Date of Service: [name], Ryan Ports. [name] Medical Record Number: [id number] Patient Account Number: [id number] Date of Birth/Sex: Treating Porter: [name] (66 y.o. [name] Primary Care [name]: [name] Other Clinician: Referring [name]: Treating [name]/Extender: [name] in Treatment: 11 Active Inactive Venous Leg Ulcer Nursing Diagnoses: Potential for venous Insuffiency (use before diagnosis confirmed) Goals: Patient will maintain optimal edema control Date Initiated:  06/11/2021 Target Resolution Date: 10/02/2021 Goal Status: Active Patient/caregiver will verbalize understanding of disease process and disease management Date Initiated: 06/11/2021 Target Resolution Date: 10/02/2021 Goal Status: Active Interventions: Assess peripheral edema status every visit. Compression as ordered Provide education on venous insufficiency Notes: Electronic Signature(s) Signed: 08/28/2021 4:52:29 PM By: [name], [name] Entered By: [name] on [name] 10:55:25 -------------------------------------------------------------------------------- Pain Assessment Details Patient Name: Date of Service: [name], Ryan Ports. [name] Medical Record Number: [id number] Patient Account Number: [id number] Date of Birth/Sex: Treating Porter: [name] (66 y.o. [name] Primary Care [name]: [name] Other Clinician: Referring [name]: Treating [name]/Extender: [name] in Treatment: 11 Active Problems Location of Pain Severity and Description of Pain Patient Has Paino No Site Locations Pain Management and Medication Current Pain Management: Electronic Signature(s) Signed:  08/28/2021 4:52:29 PM By: [name], [name] Signed: 10/13/2021 8:47:56 Porter By: [name] Entered By: [name] on [name] 10:42:50 -------------------------------------------------------------------------------- Patient/Caregiver Education Details Patient Name: Date of Service: [name] 4/28/2023andnbsp10:30 A Porter Medical Record Number: [id number] Patient Account Number: [id number] Date of Birth/Gender: Treating Porter: [birth year] (66 y.o. [name] Primary Care Physician: [name] Other Clinician: Referring Physician: Treating Physician/Extender: [name] in Treatment: 11 Education Assessment Education Provided To: Patient Education Topics Provided Venous: Handouts: Controlling Swelling with Compression Stockings Methods: Explain/Verbal Responses: State content correctly Electronic Signature(s) Signed: 08/28/2021 4:52:29 PM By: [name], [name] Entered By: [name] on [name] 10:55:45 -------------------------------------------------------------------------------- Wound Assessment Details Patient Name: Date of Service: [name], Ryan Ports. [name] Medical Record Number: [id number] Patient Account Number: [id number] Date of Birth/Sex: Treating Porter: [birth year] Porter (66 y.o. [name] Primary Care Tylik Treese: [name] Other Clinician: Referring Evangaline Jou: Treating Sheri Prows/Extender: [name] in Treatment: 11 Wound Status Wound Number: 1 Primary Etiology: Venous Leg Ulcer Wound Location: Left, Lateral Lower Leg Secondary Etiology: Lymphedema Wounding Event: Gradually Appeared Wound Status: Open Date Acquired: 02/14/2021 Comorbid History: Hypertension Weeks Of Treatment: 11 Clustered Wound: Yes Photos Wound Measurements Length: (cm) 2 Width: (cm) 0. Depth: (cm) 0. Clustered Quantity: 1 Area: (cm) 1 Volume: (cm) 0 % Reduction in Area: 97.5% 7 %  Reduction in Volume: 98.7% 1 Epithelialization: Large (67-100%) Tunneling: No .1 Undermining: No .11 Wound Description Classification: Full Thickness Without Exposed Support Structures Wound Margin: Distinct, outline attached Exudate Amount: Medium Exudate Type: Serosanguineous Exudate Color: red, brown Foul Odor After Cleansing: No Slough/Fibrino No Wound Bed Granulation Amount: Large (67-100%) Exposed Structure Granulation Quality: Red Fascia Exposed: No Necrotic Amount: None Present (0%) Fat Layer (Subcutaneous Tissue) Exposed: Yes Tendon Exposed: No Muscle Exposed: No Joint Exposed: No Bone Exposed: No Electronic Signature(s) Signed: 08/28/2021 4:52:29 PM By: [name], [name] Entered By: [name] on [name] 10:54:35 -------------------------------------------------------------------------------- Vitals Details Patient Name: Date of Service: [name], [place]. [name] Medical Record Number: [id number] Patient Account Number: [id number] Date of Birth/Sex: Treating Porter: [birth year] (66 y.o. [name], [place] Primary Care Darielle Hancher: [name] Other Clinician: Referring Raley Novicki: Treating Arnold Kester/Extender: [name] in Treatment: 11 Vital Signs Time Taken: 10:42 Temperature (F): 98.7 Height (in): 75 Pulse (bpm): 79 Weight (lbs): 400 Respiratory Rate (breaths/min): 20 Body Mass Index (BMI): 50 Blood Pressure (mmHg): 137/99 Reference Range: 80 - 120 mg / dl Electronic Signature(s) Signed: 10/13/2021 8:47:56 Porter By: [name] Entered By: [name] on [name] 10:42:40

## 2021-10-24 ENCOUNTER — Other Ambulatory Visit: Payer: Self-pay | Admitting: Medical

## 2021-11-27 ENCOUNTER — Other Ambulatory Visit: Payer: Self-pay | Admitting: Medical

## 2021-11-27 NOTE — Telephone Encounter (Signed)
Called pt to advise of the need for an appointment. No answer and couldn't lvm. KH

## 2021-11-27 NOTE — Telephone Encounter (Signed)
Pt. Needs to call office for an appointment.

## 2022-01-24 ENCOUNTER — Other Ambulatory Visit: Payer: Self-pay | Admitting: Medical

## 2022-02-01 ENCOUNTER — Other Ambulatory Visit: Payer: Self-pay | Admitting: Medical

## 2022-02-01 NOTE — Telephone Encounter (Signed)
Tried to call pt as he needs an appt but vm is full. Pt last seen in January. Needs an appt and then we can refill med

## 2022-02-17 ENCOUNTER — Other Ambulatory Visit: Payer: Self-pay | Admitting: Medical

## 2022-02-17 NOTE — Telephone Encounter (Signed)
Tried to call pt has he is overdue for an appointment but Voicemail is full

## 2022-03-27 ENCOUNTER — Other Ambulatory Visit: Payer: Self-pay | Admitting: Medical

## 2022-03-29 NOTE — Telephone Encounter (Signed)
Tried to call pt but unable to leave a message as VM is full.   HE is overdue for an appointment.

## 2022-04-24 ENCOUNTER — Other Ambulatory Visit: Payer: Self-pay | Admitting: Medical

## 2022-05-03 DIAGNOSIS — C61 Malignant neoplasm of prostate: Secondary | ICD-10-CM

## 2022-05-03 HISTORY — DX: Malignant neoplasm of prostate: C61

## 2022-05-28 ENCOUNTER — Other Ambulatory Visit: Payer: Self-pay | Admitting: Medical

## 2022-05-28 NOTE — Telephone Encounter (Signed)
Tried to call pt as he is overdue for an appt but VM is full. I will deny this

## 2022-06-04 ENCOUNTER — Other Ambulatory Visit: Payer: Self-pay | Admitting: Medical

## 2022-06-04 ENCOUNTER — Telehealth: Payer: Self-pay | Admitting: Medical

## 2022-06-04 NOTE — Telephone Encounter (Signed)
I have called pt to try to leave a message that he is overdue for an appt but VM is full and can't leave a vm

## 2022-06-04 NOTE — Telephone Encounter (Signed)
Pt called back and said he was having insurance issues but has been fixed and he scheduled a med check for Monday 2/5 at 35.

## 2022-06-04 NOTE — Telephone Encounter (Signed)
Noted will see pt Monday and refill meds then

## 2022-06-07 ENCOUNTER — Ambulatory Visit: Payer: 59 | Admitting: Medical

## 2022-06-07 VITALS — BP 136/80 | HR 80 | Wt 397.4 lb

## 2022-06-07 DIAGNOSIS — R972 Elevated prostate specific antigen [PSA]: Secondary | ICD-10-CM

## 2022-06-07 DIAGNOSIS — R7301 Impaired fasting glucose: Secondary | ICD-10-CM

## 2022-06-07 DIAGNOSIS — R609 Edema, unspecified: Secondary | ICD-10-CM

## 2022-06-07 DIAGNOSIS — I10 Essential (primary) hypertension: Secondary | ICD-10-CM | POA: Diagnosis not present

## 2022-06-07 DIAGNOSIS — I872 Venous insufficiency (chronic) (peripheral): Secondary | ICD-10-CM

## 2022-06-07 DIAGNOSIS — E785 Hyperlipidemia, unspecified: Secondary | ICD-10-CM | POA: Diagnosis not present

## 2022-06-07 MED ORDER — POTASSIUM CHLORIDE ER 10 MEQ PO TBCR: 10.0000 meq | EXTENDED_RELEASE_TABLET | Freq: Every day | ORAL | 3 refills | Status: DC

## 2022-06-07 MED ORDER — VALSARTAN-HYDROCHLOROTHIAZIDE 320-12.5 MG PO TABS: 1.0000 | ORAL_TABLET | Freq: Every day | ORAL | 3 refills | Status: DC

## 2022-06-07 MED ORDER — ASPIRIN 81 MG PO TBEC: 81.0000 mg | DELAYED_RELEASE_TABLET | Freq: Every day | ORAL | 3 refills | Status: DC

## 2022-06-07 MED ORDER — ROSUVASTATIN CALCIUM 10 MG PO TABS: 10.0000 mg | ORAL_TABLET | Freq: Every day | ORAL | 1 refills | Status: DC

## 2022-06-07 MED ORDER — FUROSEMIDE 20 MG PO TABS: 20.0000 mg | ORAL_TABLET | Freq: Every day | ORAL | 1 refills | Status: DC

## 2022-06-07 NOTE — Progress Notes (Signed)
Subjective:  Ryan RAUF [name] is a 67 y.o. male who presents for Chief Complaint  Patient presents with   med check    Med check- no concerns     Here for med check.  He has history of hypertension, hyperlipidemia, impaired fasting glucose.  He had a lot of issues last year dealing with a leg wound and venous insufficiency.   Was seeing wound clinic.  Last visit here was January 2023.  He has not been back since that time as he was doing with the leg wound and venous ulcer, seen in wound clinic numerous times last year.  Fortunately the leg wound eventually healed up  Hypertension - compliant with medication Valsartan HCT 320/12.5mg  daily.  Has been out of aspirin for a few months.  Hyperlipidemia - been out of Crestor for 1-2 months  Impaired fasting glucose - was on metformin, but thinks he is out of this medication.  Elevated PSA last year-apparently he did not go to urology after we referred last year because his insurance was not taken by that office and we never got a call back about that.  He notes that his insurance ultimately did not pay for a lot of things last year which was very frustrating for him.  He ended up paying out-of-pocket for a lot of the wound care as well  No other aggravating or relieving factors.    No other c/o.  Past Medical History:  Diagnosis Date   Family history of colon cancer    father -questionable   Hypertension    previous HTN with 400lb weight   Obesity    has been up to 400lb   Pneumonia    Wears glasses    Current Outpatient Medications on File Prior to Visit  Medication Sig Dispense Refill   metFORMIN (GLUCOPHAGE) 500 MG tablet TAKE 1 TABLET BY MOUTH EVERY DAY WITH BREAKFAST 90 tablet 1   No current facility-administered medications on file prior to visit.    The following portions of the patient's history were reviewed and updated as appropriate: allergies, current medications, past family history, past medical history, past  social history, past surgical history and problem list.  ROS Otherwise as in subjective above    Objective: BP 136/80   Pulse 80   Wt (!) 397 lb 6.4 oz (180.3 kg)   SpO2 98%   BMI 49.67 kg/m   BP Readings from Last 3 Encounters:  06/07/22 136/80  05/27/21 (!) 150/96  05/07/21 120/70   Wt Readings from Last 3 Encounters:  06/07/22 (!) 397 lb 6.4 oz (180.3 kg)  05/27/21 (!) 422 lb 14.4 oz (191.8 kg)  05/07/21 (!) 420 lb (190.5 kg)   General appearance: alert, no distress, well developed, well nourished Neck: supple, no lymphadenopathy, no thyromegaly, no masses Heart: RRR, normal S1, S2, no murmurs Lungs: CTA bilaterally, no wheezes, rhonchi, or rales Pulses: 2+ radial pulses, 2+ pedal pulses, normal cap refill Ext: no edema, moderate varicose veins of both legs particularly on the right including thighs, scarring present from left lateral distal leg ulcer wound from last year     Assessment: Encounter Diagnoses  Name Primary?   Essential hypertension Yes   Hyperlipidemia, unspecified hyperlipidemia type    Impaired fasting blood sugar    Elevated PSA    Edema, unspecified type    Chronic venous insufficiency      Plan: Hypertension-continue current medication valsartan HCT  Leg edema and chronic venous insufficiency-I recommend regular exercise,  consider compression as her compression socks, continue Lasix daily and potassium  Impaired glucose-updated labs today  Elevated PSA last year-unfortunately he never saw urology last year after our referral due to insurance issues.  I discussed with him how important it was that he see the urologist if the PSA is elevated which I assume will be.  We discussed potential causes of elevated PSA which can include prostate cancer.  Hyperlipidemia-continue statin and aspirin   [name] was seen today for med check.  Diagnoses and all orders for this visit:  Essential hypertension -     Basic metabolic  panel  Hyperlipidemia, unspecified hyperlipidemia type  Impaired fasting blood sugar -     Hemoglobin A1c  Elevated PSA -     PSA, total and free  Edema, unspecified type  Chronic venous insufficiency  Other orders -     aspirin EC 81 MG tablet; Take 1 tablet (81 mg total) by mouth daily. Swallow whole. -     rosuvastatin (CRESTOR) 10 MG tablet; Take 1 tablet (10 mg total) by mouth daily. -     valsartan-hydrochlorothiazide (DIOVAN-HCT) 320-12.5 MG tablet; Take 1 tablet by mouth daily. -     potassium chloride (KLOR-CON) 10 MEQ tablet; Take 1 tablet (10 mEq total) by mouth daily. -     furosemide (LASIX) 20 MG tablet; Take 1 tablet (20 mg total) by mouth daily.   Follow up: pending labs

## 2022-06-08 ENCOUNTER — Other Ambulatory Visit: Payer: Self-pay | Admitting: Medical

## 2022-06-08 DIAGNOSIS — R972 Elevated prostate specific antigen [PSA]: Secondary | ICD-10-CM

## 2022-06-08 LAB — PSA, TOTAL AND FREE
PSA, Free Pct: 4.5 %
PSA, Free: 2.06 ng/mL
Prostate Specific Ag, Serum: 45.3 ng/mL — ABNORMAL HIGH (ref 0.0–4.0)

## 2022-06-08 LAB — HEMOGLOBIN A1C
Est. average glucose Bld gHb Est-mCnc: 120 mg/dL
Hgb A1c MFr Bld: 5.8 % — ABNORMAL HIGH (ref 4.8–5.6)

## 2022-06-08 LAB — BASIC METABOLIC PANEL
BUN/Creatinine Ratio: 21 (ref 10–24)
BUN: 25 mg/dL (ref 8–27)
CO2: 23 mmol/L (ref 20–29)
Calcium: 9.6 mg/dL (ref 8.6–10.2)
Chloride: 101 mmol/L (ref 96–106)
Creatinine, Ser: 1.17 mg/dL (ref 0.76–1.27)
Glucose: 101 mg/dL — ABNORMAL HIGH (ref 70–99)
Potassium: 4.8 mmol/L (ref 3.5–5.2)
Sodium: 139 mmol/L (ref 134–144)
eGFR: 70 mL/min/{1.73_m2} (ref >59)

## 2022-06-08 MED ORDER — METFORMIN HCL 500 MG PO TABS: ORAL_TABLET | ORAL | 1 refills | Status: DC

## 2022-06-08 NOTE — Progress Notes (Signed)
Labs show PSA higher than it was last year.  Most important  is that we need to currently get you into urology for consult.  Expect a phone call and make sure let me know if there is some issue getting an appointment in the near future within the next 30 days  Diabetes marker stable at prediabetic range, electrolytes okay.  Continue current medications.  Instead of metformin I recommend starting a weekly medication such as Wegovy or Zepbound injection that can help control sugars but help with weight loss.   This type of medication is a pen device with a very tiny needle that you barely feel.  The medicine typically works really well.  Side effects can include nausea constipation and acid reflux  If agreeable lets do this instead of metformin.  Continue rest of medicines as usual

## 2022-06-15 ENCOUNTER — Encounter: Payer: Self-pay | Admitting: Urology

## 2022-06-15 ENCOUNTER — Ambulatory Visit (INDEPENDENT_AMBULATORY_CARE_PROVIDER_SITE_OTHER): Payer: 59 | Admitting: Urology

## 2022-06-15 ENCOUNTER — Other Ambulatory Visit: Payer: Self-pay

## 2022-06-15 VITALS — BP 137/88 | HR 86 | Ht 75.0 in | Wt 370.0 lb

## 2022-06-15 DIAGNOSIS — R972 Elevated prostate specific antigen [PSA]: Secondary | ICD-10-CM | POA: Diagnosis not present

## 2022-06-15 LAB — URINALYSIS
Bilirubin, UA: NEGATIVE
Glucose, UA: NEGATIVE mg/dL
Ketones, UA: NEGATIVE
Leukocytes, UA: NEGATIVE
Nitrite, UA: NEGATIVE
Protein, UA: NEGATIVE
Spec Grav, UA: 1.015 (ref 1.010–1.025)
Urobilinogen, UA: 0.2 E.U./dL
pH, UA: 5.5 (ref 5.0–8.0)

## 2022-06-15 NOTE — Progress Notes (Unsigned)
Assessment: 1. Elevated PSA      Plan: Today I had a long discussion with the patient regarding his marked PSA elevation very worrisome for prostate cancer given the trend over time.  I recommended further evaluation including transrectal ultrasound and prostate biopsy.  Petra Kuba of procedure discussed in detail today including potential adverse events and complications.  I especially emphasized the risk of infection and need for antibiotic prophylaxis.  The patient had numerous questions today which were answered and following our discussion agrees to proceed with biopsy.  Will schedule next available.  Total time providing care during visit = 51 min  Chief Complaint:  Chief Complaint  Patient presents with   Elevated PSA    History of Present Illness:  Ryan KOVACICH [name] is a 67 y.o. male who is seen in consultation from Carlena Hurl, PA-C for evaluation of elevated psa.  Patient has a long history of elevated PSA-see PSA data below.  Today I reviewed his prior PSA data and prior urologic evaluation.  Patient was evaluated by Dr. Jeffie Pollock in 2018 at which time he recommended a ultrasound and biopsy.  The patient did not follow through with that and states that he had some insurance issues at that time.  At that visit, Dr. Jeffie Pollock also documented a 2+ but benign feeling prostate.  Today I cannot feel much except the very apex of his prostate given his body habitus associated with morbid obesity.  Patient was also referred to urology following his PSA elevation noted in November 2022 but did not follow-up.  Patient denies any current GU complaints.  He reports minimal lower urinary tract symptoms. IPSS = 2 Patient is unsure however he states that his father may have had prostate cancer.    PSA DATA: 06/2012  0.94 06/2013  2.79 01/2015  5.6 (5% free) 06/2016  6.6 03/2021 28.8 06/2022  45.3 (4.5% free)   Past Medical History:  Past Medical History:  Diagnosis Date   Family history of  colon cancer    father -questionable   Hypertension    previous HTN with 400lb weight   Obesity    has been up to 400lb   Pneumonia    Wears glasses     Past Surgical History:  Past Surgical History:  Procedure Laterality Date   unremarkable      Allergies:  No Known Allergies  Family History:  Family History  Problem Relation Age of Onset   Cancer Mother 42       metastases-unaware as to type of ca   Obesity Mother    Colon cancer Father 5       questionable   Liver disease Father    Alcohol abuse Father    Heart disease Neg Hx    Hypertension Neg Hx    Hyperlipidemia Neg Hx    Stroke Neg Hx    Diabetes Neg Hx    Esophageal cancer Neg Hx    Rectal cancer Neg Hx     Social History:  Social History   Tobacco Use   Smoking status: Never   Smokeless tobacco: Never  Substance Use Topics   Alcohol use: Yes    Comment: occasional only   Drug use: No    Review of symptoms:  Constitutional:  Negative for unexplained weight loss, night sweats, fever, chills ENT:  Negative for nose bleeds, sinus pain, painful swallowing CV:  Negative for chest pain, shortness of breath, exercise intolerance, palpitations, loss of consciousness Resp:  Negative for cough, wheezing, shortness of breath GI:  Negative for nausea, vomiting, diarrhea, bloody stools GU:  Positives noted in HPI; otherwise negative for gross hematuria, dysuria, urinary incontinence Neuro:  Negative for seizures, poor balance, limb weakness, slurred speech Psych:  Negative for lack of energy, depression, anxiety Endocrine:  Negative for polydipsia, polyuria, symptoms of hypoglycemia (dizziness, hunger, sweating) Hematologic:  Negative for anemia, purpura, petechia, prolonged or excessive bleeding, use of anticoagulants  Allergic:  Negative for difficulty breathing or choking as a result of exposure to anything; no shellfish allergy; no allergic response (rash/itch) to materials, foods  Physical exam: BP  137/88   Pulse 86   Ht 6' 3"$  (1.905 m)   Wt (!) 370 lb (167.8 kg)   BMI 46.25 kg/m  GENERAL APPEARANCE:  Morbidly obese BM, NAD HEENT: Atraumatic, Normocephalic ABDOMEN: Soft, non-tender, No Masses.  GU: Normal uncircumcised phallus.  Normal testes and cords.  No evidence of hernia. DRE: Normal sphincter tone; prostate is very difficult to feel due to his body habitus.  I can just make out the tip of his apex which is palpably normal.

## 2022-06-23 ENCOUNTER — Other Ambulatory Visit: Payer: PRIVATE HEALTH INSURANCE | Admitting: Urology

## 2022-07-05 ENCOUNTER — Telehealth: Payer: Self-pay

## 2022-07-05 NOTE — Telephone Encounter (Signed)
-----   Message from Dema Severin, [place] sent at 06/22/2022  2:41 PM EST ----- Regarding: prostate biopsy Instructions Call patient to review the biopsy instructions.

## 2022-07-05 NOTE — Telephone Encounter (Signed)
Spoke with patient regarding prep for biopsy. After explanation of prep procedure, patient verbalized understanding.

## 2022-07-07 ENCOUNTER — Ambulatory Visit (INDEPENDENT_AMBULATORY_CARE_PROVIDER_SITE_OTHER): Payer: 59 | Admitting: Urology

## 2022-07-07 ENCOUNTER — Encounter: Payer: Self-pay | Admitting: Urology

## 2022-07-07 ENCOUNTER — Ambulatory Visit (HOSPITAL_BASED_OUTPATIENT_CLINIC_OR_DEPARTMENT_OTHER)
Admission: RE | Admit: 2022-07-07 | Discharge: 2022-07-07 | Disposition: A | Discharge status: Home / Self Care | Payer: 59 | Source: Ambulatory Visit | Attending: Urology | Admitting: Urology

## 2022-07-07 VITALS — BP 144/87 | HR 80 | Ht 75.0 in | Wt 390.0 lb

## 2022-07-07 DIAGNOSIS — R972 Elevated prostate specific antigen [PSA]: Secondary | ICD-10-CM

## 2022-07-07 DIAGNOSIS — C61 Malignant neoplasm of prostate: Secondary | ICD-10-CM

## 2022-07-07 LAB — URINALYSIS
Bilirubin, UA: NEGATIVE
Blood, UA: NEGATIVE
Glucose, UA: NEGATIVE mg/dL
Ketones, POC UA: NEGATIVE mg/dL
Leukocytes, UA: NEGATIVE
Nitrite, UA: NEGATIVE
Spec Grav, UA: 1.025 (ref 1.010–1.025)
Urobilinogen, UA: 0.2 E.U./dL
pH, UA: 6 (ref 5.0–8.0)

## 2022-07-07 MED ORDER — CEFTRIAXONE SODIUM 1 G IJ SOLR
1.0000 g | Freq: Once | INTRAMUSCULAR | Status: AC
  Administered 2022-07-07: 1 g via INTRAMUSCULAR

## 2022-07-07 NOTE — Progress Notes (Signed)
   Assessment: 1. Elevated PSA     Plan: FU path-- patient will return in 1 week to review results and options  Chief Complaint: Elevated psa  HPI: Ryan Happ. is a 67 y.o. male who presents for continued evaluation of markedly elevated PSA (45).  Patient doing well.  UA clear today.   Portions of the above documentation were copied from a prior visit for review purposes only.  Allergies: No Known Allergies  PMH: Past Medical History:  Diagnosis Date   Family history of colon cancer    father -questionable   Hypertension    previous HTN with 400lb weight   Obesity    has been up to 400lb   Pneumonia    Wears glasses     PSH: Past Surgical History:  Procedure Laterality Date   unremarkable      SH: Social History   Tobacco Use   Smoking status: Never   Smokeless tobacco: Never  Substance Use Topics   Alcohol use: Yes    Comment: occasional only   Drug use: No    ROS: Constitutional:  Negative for fever, chills, weight loss CV: Negative for chest pain, previous MI, hypertension Respiratory:  Negative for shortness of breath, wheezing, sleep apnea, frequent cough GI:  Negative for nausea, vomiting, bloody stool, GERD  PE:   TRANSRECTAL ULTRASOUND AND PROSTATE BIOPSY  Indication:  Elevated PSA  Prophylactic antibiotic administration: Rocephin  All medications that could result in increased bleeding were discontinued within an appropriate period of the time of biopsy.  Risk including bleeding and infection were discussed.  Informed consent was obtained.  The patient was placed in the left lateral decubitus position.  PROCEDURE 1.  TRANSRECTAL ULTRASOUND OF THE PROSTATE  The 7 MHz transrectal probe was used to image the prostate.  Anal stenosis was not noted.  TRUS volume: 15.4 ml  Hypoechoic areas: Right PZ apex to base-- significant asymmetry R>>L  Hyperechoic areas: None  Central calcifications: present  Margins:  normal  Seminal  Vesicles: normal   PROCEDURE 2:  PROSTATE BIOPSY  A periprostatic block was performed using 1% lidocaine and transrectal ultrasound guidance. Under transrectal ultrasound guidance, and using the Biopty gun, prostate biopsies were obtained systematically from the apex, mid gland, and base bilaterally.  A total of 8 cores were obtained (due to small prostate volume especially on left).  Hemostasis was obtained with gentle pressure on the prostate.  The procedures were well-tolerated.  No significant bleeding was noted at the end of the procedure.  The patient was stable for discharge from the office.

## 2022-07-12 ENCOUNTER — Encounter: Payer: Self-pay | Admitting: Urology

## 2022-07-14 ENCOUNTER — Telehealth: Payer: Self-pay | Admitting: Medical

## 2022-07-14 NOTE — Telephone Encounter (Signed)
Pt called back and states he is getting tetanus at pharmacy

## 2022-07-14 NOTE — Telephone Encounter (Signed)
Pt called and states he needs a tetanus vaccine for a job but  his chat shows he is due for Tdap. Please advise is pt can have here. Pt can be reached at [phone].

## 2022-07-20 ENCOUNTER — Encounter: Payer: Self-pay | Admitting: Urology

## 2022-07-20 ENCOUNTER — Ambulatory Visit (INDEPENDENT_AMBULATORY_CARE_PROVIDER_SITE_OTHER): Payer: PRIVATE HEALTH INSURANCE | Admitting: Urology

## 2022-07-20 VITALS — BP 135/91 | HR 67 | Ht 75.0 in | Wt 360.0 lb

## 2022-07-20 DIAGNOSIS — C61 Malignant neoplasm of prostate: Secondary | ICD-10-CM | POA: Diagnosis not present

## 2022-07-20 NOTE — Progress Notes (Signed)
   Assessment: 1. Prostate cancer [facility])     Plan: Today had a long and detailed discussion with the patient regarding his prostate cancer diagnosis including his classification as being high risk.  I discussed with him the natural history of his disease as well as recommendations for further evaluation. Will obtain PSMA-pet for staging/metastatic evaluation. FU after PET   Total time= 42 min  Chief Complaint: Prostate biopsy results  HPI: [name] [name] is a 67 y.o. male who presents for follow-up regarding newly diagnosed prostate cancer.  Today patient reports no complications as a result of his recent biopsy.  Prostate cancer summary: Initial diagnosis 07/2022-TRUS/BX PreTx PSA:  45 Pathology: 5/8 cores positive (bilateral involvement) with large volume Gleason 3+4= 7 (8 core biopsy performed due to patient poorly tolerating procedure and very small gland (16ml)  DRE:  ? Difficult to feel due to body habitus IPSS= 2 Fam hx:  possible in father/uncertain   PSA DATA: [id number]             0.94 06/2013             2.79 01/2015             5.6 (5% free) 06/2016             6.6 03/2021           28.8 06/2022             45.3 (4.5% free)   Portions of the above documentation were copied from a prior visit for review purposes only.  Allergies: No Known Allergies  PMH: Past Medical History:  Diagnosis Date   Family history of colon cancer    father -questionable   Hypertension    previous HTN with 400lb weight   Obesity    has been up to 400lb   Pneumonia    Wears glasses     PSH: Past Surgical History:  Procedure Laterality Date   unremarkable      SH: Social History   Tobacco Use   Smoking status: Never   Smokeless tobacco: Never  Substance Use Topics   Alcohol use: Yes    Comment: occasional only   Drug use: No    ROS: Constitutional:  Negative for fever, chills, weight loss CV: Negative for chest pain, previous MI, hypertension Respiratory:   Negative for shortness of breath, wheezing, sleep apnea, frequent cough GI:  Negative for nausea, vomiting, bloody stool, GERD  PE: BP (!) 135/91   Pulse 67   Ht 6\' 3"  (1.905 m)   Wt (!) 360 lb (163.3 kg)   BMI 45.00 kg/m  GENERAL APPEARANCE:  Morbidly obese bm, NAD

## 2022-08-06 ENCOUNTER — Ambulatory Visit (HOSPITAL_COMMUNITY)
Admission: RE | Admit: 2022-08-06 | Discharge: 2022-08-06 | Disposition: A | Discharge status: Home / Self Care | Payer: 59 | Source: Ambulatory Visit | Attending: Urology | Admitting: Urology

## 2022-08-06 DIAGNOSIS — C61 Malignant neoplasm of prostate: Secondary | ICD-10-CM | POA: Insufficient documentation

## 2022-08-06 MED ORDER — PIFLIFOLASTAT F 18 (PYLARIFY) INJECTION
9.0000 | Freq: Once | INTRAVENOUS | Status: AC
  Administered 2022-08-06: 9 via INTRAVENOUS

## 2022-08-09 ENCOUNTER — Other Ambulatory Visit: Payer: Self-pay | Admitting: Urology

## 2022-08-09 ENCOUNTER — Telehealth: Payer: Self-pay | Admitting: Urology

## 2022-08-09 DIAGNOSIS — C61 Malignant neoplasm of prostate: Secondary | ICD-10-CM

## 2022-08-09 NOTE — Telephone Encounter (Signed)
Called patient today to review results of PSMA PET scan.  This showed no definite evidence of metastatic disease-questionable early pelvic node.  Patient would like to proceed with combination RT plus ADT.  Will set up office visit to initiate ADT and radiation oncology consult.

## 2022-08-12 NOTE — Progress Notes (Signed)
GU Location of Tumor / Histology: Prostate Ca  If Prostate Cancer, Gleason Score is (3 + 4) and PSA is (45.3 on 06/07/2022)  07/20/2022 Dr. Marcha Solders  Prostate cancer summary: Initial diagnosis 07/2022-TRUS/BX PreTx PSA:  45 Pathology: 5/8 cores positive (bilateral involvement) with large volume Gleason 3+4= 7 (8 core biopsy performed due to patient poorly tolerating procedure and very small gland (61ml)   PSA DATA: 06/2012             0.94 06/2013             2.79 01/2015             5.6 (5% free) 06/2016             6.6 03/2021           28.8 06/2022             45.3 (4.5% free)  Past/Anticipated interventions by urology, if any: 09/15/2022  Dr. Margo Aye  Past/Anticipated interventions by medical oncology, if any: NA  Weight changes, if any:  No  IPSS:  2 SHIM: 3  Bowel/Bladder complaints, if any:  No  Nausea/Vomiting, if any: No  Pain issues, if any:  0/10  SAFETY ISSUES: Prior radiation? No Pacemaker/ICD? No Possible current pregnancy? Male Is the patient on methotrexate? No  Current Complaints / other details:

## 2022-08-16 ENCOUNTER — Encounter: Payer: Self-pay | Admitting: Urology

## 2022-08-16 ENCOUNTER — Ambulatory Visit (INDEPENDENT_AMBULATORY_CARE_PROVIDER_SITE_OTHER): Payer: 59 | Admitting: Urology

## 2022-08-16 VITALS — BP 141/97 | HR 90 | Ht 75.0 in | Wt 360.0 lb

## 2022-08-16 DIAGNOSIS — Z79818 Long term (current) use of other agents affecting estrogen receptors and estrogen levels: Secondary | ICD-10-CM

## 2022-08-16 DIAGNOSIS — C61 Malignant neoplasm of prostate: Secondary | ICD-10-CM

## 2022-08-16 MED ORDER — DEGARELIX ACETATE(240 MG DOSE) 120 MG/VIAL ~~LOC~~ SOLR
240.0000 mg | Freq: Once | SUBCUTANEOUS | Status: AC
  Administered 2022-08-16: 240 mg via SUBCUTANEOUS

## 2022-08-16 NOTE — Progress Notes (Signed)
Firmagon Sub Q Injection  Due to Prostate Cancer patient is present today for a Firmagon Injection.   Medication: Deborra Medina (Degarelix)  Dose: 240mg  Location: right & left upper abdomen Lot: [id number] Exp: 10/31/2024  Patient tolerated well, no complications were noted  Performed by: Franchot Erichsen CMA & Christal Nichols CMA  Follow up: RTC in 1 mo for next injection.

## 2022-08-16 NOTE — Progress Notes (Signed)
  Assessment: 1. Malignant neoplasm of prostate   2. Androgen deprivation therapy     Plan: I discussed the role of androgen deprivation therapy and management of high risk prostate cancer.  Potential side effects of ADT discussed and information provided. Loading dose of degarelix given today Begin daily calcium and vit D supplements Return to office with Dr. Margo Aye in 1 month for next injection of Eligard 6 month Referral to Radiation Oncology pending  Chief Complaint: Prostate cancer Initiation of ADT  HPI: [name]. is a 67 y.o. male who presents for continued evaluation and management of newly diagnosed prostate cancer.   Prostate cancer summary: Initial diagnosis 07/2022-TRUS/BX PreTx PSA:  45 Pathology: 5/8 cores positive (bilateral involvement) with large volume Gleason 3+4= 7 (8 core biopsy performed due to patient poorly tolerating procedure and very small gland (69ml)  DRE:  ? Difficult to feel due to body habitus IPSS= 2 Fam hx:  possible in father/uncertain   PSA DATA: 06/2012             0.94 06/2013             2.79 01/2015             5.6 (5% free) 06/2016             6.6 03/2021           28.8 06/2022             45.3 (4.5% free)  PSMA PET scan from 08/06/2022 showed increased uptake in the right side of the prostate and an 8 mm right pelvic sidewall lymph node concerning for metastatic disease. He presents today for initiation of androgen deprivation therapy.  Portions of the above documentation were copied from a prior visit for review purposes only.  Allergies: No Known Allergies  PMH: Past Medical History:  Diagnosis Date   Family history of colon cancer    father -questionable   Hypertension    previous HTN with 400lb weight   Obesity    has been up to 400lb   Pneumonia    Wears glasses     PSH: Past Surgical History:  Procedure Laterality Date   unremarkable      SH: Social History   Tobacco Use   Smoking status: Never    Smokeless tobacco: Never  Substance Use Topics   Alcohol use: Yes    Comment: occasional only   Drug use: No    ROS: Constitutional:  Negative for fever, chills, weight loss CV: Negative for chest pain, previous MI, hypertension Respiratory:  Negative for shortness of breath, wheezing, sleep apnea, frequent cough GI:  Negative for nausea, vomiting, bloody stool, GERD  PE: BP (!) 141/97   Pulse 90   Ht 6\' 3"  (1.905 m)   Wt (!) 360 lb (163.3 kg)   BMI 45.00 kg/m  GENERAL APPEARANCE:  Well appearing, well developed, well nourished, NAD HEENT:  Atraumatic, normocephalic, oropharynx clear NECK:  Supple without lymphadenopathy or thyromegaly ABDOMEN:  Soft, non-tender, no masses EXTREMITIES:  Moves all extremities well, without clubbing, cyanosis, or edema NEUROLOGIC:  Alert and oriented x 3, normal gait, CN II-XII grossly intact MENTAL STATUS:  appropriate BACK:  Non-tender to palpation, No CVAT SKIN:  Warm, dry, and intact   Results: None

## 2022-08-17 ENCOUNTER — Ambulatory Visit
Admission: RE | Admit: 2022-08-17 | Discharge: 2022-08-17 | Disposition: A | Discharge status: Home / Self Care | Payer: 59 | Source: Ambulatory Visit | Attending: Radiation Oncology | Admitting: Radiation Oncology

## 2022-08-17 ENCOUNTER — Encounter: Payer: Self-pay | Admitting: Radiation Oncology

## 2022-08-17 ENCOUNTER — Other Ambulatory Visit: Payer: Self-pay

## 2022-08-17 VITALS — Ht 75.0 in | Wt 360.0 lb

## 2022-08-17 DIAGNOSIS — C61 Malignant neoplasm of prostate: Secondary | ICD-10-CM

## 2022-08-17 HISTORY — DX: Type 2 diabetes mellitus without complications: E11.9

## 2022-08-17 HISTORY — DX: Hyperlipidemia, unspecified: E78.5

## 2022-08-17 NOTE — Progress Notes (Signed)
Radiation Oncology         [phone] ________________________________  Initial Outpatient Consultation - Conducted via Telephone due to current COVID-19 concerns for limiting patient exposure  Name: Ryan LOVELAND [name] MRN: [id number]  Date: 08/17/2022  DOB: April 04, 1956  WU:[id number], Kermit Balo, PA-C  Joline Maxcy, MD   REFERRING PHYSICIAN: Joline Maxcy, MD  DIAGNOSIS: 67 y.o. gentleman with Stage T1c adenocarcinoma of the prostate with Gleason score of 3+4, and PSA of 45.3.    ICD-10-CM   1. Malignant neoplasm of prostate  C61       HISTORY OF PRESENT ILLNESS: Ryan Cozby. is a 67 y.o. male with a diagnosis of prostate cancer. He was previously referred to Dr. Annabell Howells in 2018 for an elevated PSA of 6.6. At that time, he was recommended to proceed with [place] and prostate biopsy. Unfortunately, due to insurance issues at that time, he did not follow through with this. He was referred again in late 2022 for a PSA of 28.8, but he again did not follow up. More recently, his PSA had increased up to 45.3 on labs obtained by his primary care provider, Ryan Oyster, PA-C.  Accordingly, he was referred for evaluation in urology by Dr. Margo Aye on 06/15/22,  digital rectal examination performed at that time was normal within limitation of patient's body habitus.  The patient proceeded to transrectal ultrasound with 8 biopsies of the prostate on 07/07/22.  Dr. Margo Aye had to abort the biopsy procedure after obtaining only 8 of a planned 12 core biopsies due to patient inability to tolerate the procedure. The prostate volume measured 15.4 cc.  Out of 8 core biopsies, 5 were positive.  The maximum Gleason score was 3+4, and this was seen in the right base, right mid, right apex, left lateral apex, and right lateral mid.  Given his significantly elevated PSA, he underwent staging PSMA PET scan on 08/06/22 showing increased radiotracer uptake within the right side of the prostate as well as an 8 mm right pelvic  sidewall lymph node with SUV max of 3.32.  While there is not any definitive evidence of metastasis, we cannot exclude early nodal metastasis but there is no additional tracer-avid lymph nodes, solid organ metastasis, or distant metastatic disease.    The patient reviewed the biopsy and imaging results with his urologist and he has kindly been referred today for discussion of potential radiation treatment options.  He has already started ADT with Firmagon injections in the [facility] urology office yesterday, 08/16/22 and is scheduled for a 64-month Lupron injection on 09/15/2022.   PREVIOUS RADIATION THERAPY: No  PAST MEDICAL HISTORY:  Past Medical History:  Diagnosis Date   Diabetes mellitus without complication    Family history of colon cancer    father -questionable   Hyperlipidemia    Hypertension    previous HTN with 400lb weight   Obesity    has been up to 400lb   Pneumonia    Wears glasses       PAST SURGICAL HISTORY: Past Surgical History:  Procedure Laterality Date   PROSTATE BIOPSY     unremarkable      FAMILY HISTORY:  Family History  Problem Relation Age of Onset   Cancer Mother 74       metastases-unaware as to type of ca   Obesity Mother    Colon cancer Father 34       questionable   Liver disease Father    Alcohol abuse  Father    Heart disease Neg Hx    Hypertension Neg Hx    Hyperlipidemia Neg Hx    Stroke Neg Hx    Diabetes Neg Hx    Esophageal cancer Neg Hx    Rectal cancer Neg Hx     SOCIAL HISTORY:  Social History   Socioeconomic History   Marital status: Married    Spouse name: Not on file   Number of children: Not on file   Years of education: Not on file   Highest education level: Not on file  Occupational History   Not on file  Tobacco Use   Smoking status: Never   Smokeless tobacco: Never  Vaping Use   Vaping Use: Never used  Substance and Sexual Activity   Alcohol use: Yes    Comment: occasional only   Drug use: No   Sexual  activity: Not Currently  Other Topics Concern   Not on file  Social History Narrative   Married, son in college, has 5 adopted children, some special needs ,some planning to adopt, Exercise with walking most mornings, walks 2 miles at a time typically. Works at [employer].   Social Determinants of Health   Financial Resource Strain: Not on file  Food Insecurity: No Food Insecurity (08/17/2022)   Hunger Vital Sign    Worried About Running Out of Food in the Last Year: Never true    Ran Out of Food in the Last Year: Never true  Transportation Needs: No Transportation Needs (08/17/2022)   PRAPARE - Administrator, Civil Service (Medical): No    Lack of Transportation (Non-Medical): No  Physical Activity: Not on file  Stress: Not on file  Social Connections: Not on file  Intimate Partner Violence: Not At Risk (08/17/2022)   Humiliation, Afraid, Rape, and Kick questionnaire    Fear of Current or Ex-Partner: No    Emotionally Abused: No    Physically Abused: No    Sexually Abused: No    ALLERGIES: Patient has no known allergies.  MEDICATIONS:  Current Outpatient Medications  Medication Sig Dispense Refill   Cholecalciferol (VITAMIN D-3) 25 MCG (1000 UT) CAPS Take by mouth.     aspirin EC 81 MG tablet Take 1 tablet (81 mg total) by mouth daily. Swallow whole. 90 tablet 3   furosemide (LASIX) 20 MG tablet Take 1 tablet (20 mg total) by mouth daily. 90 tablet 1   metFORMIN (GLUCOPHAGE) 500 MG tablet TAKE 1 TABLET BY MOUTH EVERY DAY WITH BREAKFAST 90 tablet 1   potassium chloride (KLOR-CON) 10 MEQ tablet Take 1 tablet (10 mEq total) by mouth daily. 90 tablet 3   rosuvastatin (CRESTOR) 10 MG tablet Take 1 tablet (10 mg total) by mouth daily. 90 tablet 1   valsartan-hydrochlorothiazide (DIOVAN-HCT) 320-12.5 MG tablet Take 1 tablet by mouth daily. 90 tablet 3   No current facility-administered medications for this encounter.    REVIEW OF SYSTEMS:  On  review of systems, the patient reports that he is doing well overall. He denies any chest pain, shortness of breath, cough, fevers, chills, night sweats, unintended weight changes. He denies any bowel disturbances, and denies abdominal pain, nausea or vomiting. He denies any new musculoskeletal or joint aches or pains. His IPSS was 2, indicating mild urinary symptoms. His SHIM was 3, indicating he has severe erectile dysfunction. A complete review of systems is obtained and is otherwise negative.    PHYSICAL EXAM:  Wt Readings from Last  3 Encounters:  08/17/22 (!) 360 lb (163.3 kg)  08/16/22 (!) 360 lb (163.3 kg)  07/20/22 (!) 360 lb (163.3 kg)   Temp Readings from Last 3 Encounters:  05/27/21 (!) 97.1 F (36.2 C)  05/07/21 98.3 F (36.8 C)  04/06/21 98.2 F (36.8 C)   BP Readings from Last 3 Encounters:  08/16/22 (!) 141/97  07/20/22 (!) 135/91  07/07/22 (!) 144/87   Pulse Readings from Last 3 Encounters:  08/16/22 90  07/20/22 67  07/07/22 80   Pain Assessment Pain Score: 0-No pain Pain Loc: Abdomen/10  Physical exam not performed in light of telephone consult visit format.   KPS = 100  100 - Normal; no complaints; no evidence of disease. 90   - Able to carry on normal activity; minor signs or symptoms of disease. 80   - Normal activity with effort; some signs or symptoms of disease. 82   - Cares for self; unable to carry on normal activity or to do active work. 60   - Requires occasional assistance, but is able to care for most of his personal needs. 50   - Requires considerable assistance and frequent medical care. 40   - Disabled; requires special care and assistance. 30   - Severely disabled; hospital admission is indicated although death not imminent. 20   - Very sick; hospital admission necessary; active supportive treatment necessary. 10   - Moribund; fatal processes progressing rapidly. 0     - Dead  Karnofsky DA, Abelmann WH, Craver LS and Burchenal [facility]  [phone]) The use of the nitrogen mustards in the palliative treatment of carcinoma: with particular reference to bronchogenic carcinoma Cancer 1 634-56  LABORATORY DATA:  Lab Results  Component Value Date   WBC 7.1 03/31/2021   HGB 14.3 03/31/2021   HCT 41.5 03/31/2021   MCV 89 03/31/2021   PLT 249 03/31/2021   Lab Results  Component Value Date   NA 139 06/07/2022   K 4.8 06/07/2022   CL 101 06/07/2022   CO2 23 06/07/2022   Lab Results  Component Value Date   ALT 16 03/31/2021   AST 25 03/31/2021   ALKPHOS 100 03/31/2021   BILITOT 0.6 03/31/2021     RADIOGRAPHY: NM PET (PSMA) SKULL TO MID THIGH  Result Date: 08/07/2022 CLINICAL DATA:  Prostate carcinoma . EXAM: NUCLEAR MEDICINE PET SKULL BASE TO THIGH TECHNIQUE: 9.05 mCi F18 Piflufolastat (Pylarify) was injected intravenously. Full-ring PET imaging was performed from the skull base to thigh after the radiotracer. CT data was obtained and used for attenuation correction and anatomic localization. COMPARISON:  None Available. FINDINGS: NECK No radiotracer activity in neck lymph nodes. Incidental CT finding: None. CHEST No radiotracer accumulation within mediastinal or hilar lymph nodes. No suspicious pulmonary nodules on the CT scan. Incidental CT finding: Aortic atherosclerotic calcifications. ABDOMEN/PELVIS Prostate: There is increased radiotracer uptake within the right side of prostate gland within SUV max of 23.12. Lymph nodes: Prominent right pelvic sidewall lymph node measures 8 mm and has an SUV max of 3.32, image 205/4. Liver: No evidence of liver metastasis. Incidental CT finding: Aortic atherosclerotic calcifications. SKELETON No focal activity to suggest skeletal metastasis. IMPRESSION: 1. Increased radiotracer uptake within the right side of the prostate gland compatible with primary prostate neoplasm. 2. 8 mm, right pelvic sidewall lymph node has an SUV max of 3.32. Cannot exclude early nodal metastasis. No additional tracer avid  lymph nodes identified. 3. No signs of solid organ metastasis or distant metastatic disease.  4. Aortic Atherosclerosis (ICD10-I70.0). Electronically Signed   By: Signa Kell M.D.   On: 08/07/2022 11:22      IMPRESSION/PLAN: This visit was conducted via Telephone to spare the patient unnecessary potential exposure in the healthcare setting during the current COVID-19 pandemic. 1. 67 y.o. gentleman with Stage T1c adenocarcinoma of the prostate with Gleason Score of 3+4, and PSA of 45.3. We discussed the patient's workup and outlined the nature of prostate cancer in this setting. The patient's T stage, Gleason's score, and PSA put him into the high risk group. Accordingly, he is eligible for a variety of potential treatment options including prostatectomy or LT-ADT in combination with either 8 weeks of external radiation or 5 weeks of external radiation preceded by a brachytherapy boost. We discussed the available radiation techniques, and focused on the details and logistics and delivery. The patient is not a candidate for brachytherapy boost with a prostate volume of 15 cc prior to starting ADT.  Therefore, we discussed and outlined the risks, benefits, short and long-term effects associated with daily external beam radiotherapy and compared and contrasted these with prostatectomy. We discussed the role of SpaceOAR in reducing the rectal toxicity associated with radiotherapy.  He was encouraged to ask questions that were answered to his stated satisfaction.  At the end of the conversation the patient is interested in moving forward with 8 weeks of external beam therapy concurrent with LT-ADT. He was started on ADT on 08/16/2022 with Firmagon injections and is scheduled for his 35-month Eligard injection on 09/15/2022. Since he did not tolerate the biopsy procedure well at all, we will forego placement of fiducial markers and proceed with coordinating for CT simulation/treatment planning in early June 2024, in  anticipation of beginning the daily radiation treatments around the second week of June 2024, approximately 2 months from the start of ADT.  The patient appears to have a good understanding of his disease and our treatment recommendations which are of curative intent and is in agreement with the stated plan.  Therefore, we share our discussion with Dr. Margo Aye and will move forward with treatment planning accordingly.  We enjoyed meeting with him today and look forward to continuing to participate in his care.   Given current concerns for patient exposure during the COVID-19 pandemic, this encounter was conducted via telephone. The patient was notified in advance and was offered a MyChart meeting to allow for face to face communication but unfortunately reported that he did not have the appropriate resources/technology to support such a visit and instead preferred to proceed with telephone consult. The patient has given verbal consent for this type of encounter. The attendants for this meeting include Margaretmary Dys MD, Ashlyn Bruning PA-C, and patient, Ryan DEBUSK [name]. During the encounter, Margaretmary Dys MD and Marcello Fennel PA-C were located at [facility] Radiation Oncology Department.  Patient, Ryan QUAM [name] was located at home.   We personally spent 60 minutes in this encounter including chart review, reviewing radiological studies, meeting face-to-face with the patient, entering orders and completing documentation.    Marguarite Arbour, PA-C    Margaretmary Dys, MD  [facility]  Radiation Oncology Direct Dial: [phone]  Fax: [phone] [email or web address]  Skype  [email or web address]   This document serves as a record of services personally performed by Margaretmary Dys, MD and Marcello Fennel, PA-C. It was created on their behalf by Mickie Bail, a trained medical scribe. The creation of this record is based on the scribe's  personal observations and the provider's  statements to them. This document has been checked and approved by the attending provider.

## 2022-08-18 ENCOUNTER — Telehealth: Payer: Self-pay

## 2022-08-18 NOTE — Telephone Encounter (Signed)
Spoke with [name] at [place] to get a prior [place] for [name].   PA approved from 08/18/22 thru 09/15/23   Auth # [id number]  Patient appt is 09/15/22.

## 2022-08-20 NOTE — Progress Notes (Signed)
RN left message for call back to assess any navigation needs.

## 2022-09-14 ENCOUNTER — Ambulatory Visit (INDEPENDENT_AMBULATORY_CARE_PROVIDER_SITE_OTHER): Payer: 59 | Admitting: Medical

## 2022-09-14 ENCOUNTER — Encounter: Payer: Self-pay | Admitting: Medical

## 2022-09-14 VITALS — BP 118/70 | HR 85 | Ht 75.0 in | Wt 392.4 lb

## 2022-09-14 DIAGNOSIS — Z23 Encounter for immunization: Secondary | ICD-10-CM

## 2022-09-14 DIAGNOSIS — Z1211 Encounter for screening for malignant neoplasm of colon: Secondary | ICD-10-CM

## 2022-09-14 DIAGNOSIS — I700 Atherosclerosis of aorta: Secondary | ICD-10-CM

## 2022-09-14 DIAGNOSIS — E559 Vitamin D deficiency, unspecified: Secondary | ICD-10-CM

## 2022-09-14 DIAGNOSIS — Z7185 Encounter for immunization safety counseling: Secondary | ICD-10-CM | POA: Diagnosis not present

## 2022-09-14 DIAGNOSIS — E785 Hyperlipidemia, unspecified: Secondary | ICD-10-CM | POA: Diagnosis not present

## 2022-09-14 DIAGNOSIS — I10 Essential (primary) hypertension: Secondary | ICD-10-CM

## 2022-09-14 DIAGNOSIS — Z0000 Encounter for general adult medical examination without abnormal findings: Secondary | ICD-10-CM | POA: Diagnosis not present

## 2022-09-14 DIAGNOSIS — I8393 Asymptomatic varicose veins of bilateral lower extremities: Secondary | ICD-10-CM

## 2022-09-14 DIAGNOSIS — R7301 Impaired fasting glucose: Secondary | ICD-10-CM

## 2022-09-14 MED ORDER — TIRZEPATIDE 5 MG/0.5ML ~~LOC~~ SOAJ: 5.0000 mg | SUBCUTANEOUS | 0 refills | Status: DC

## 2022-09-14 MED ORDER — TIRZEPATIDE 2.5 MG/0.5ML ~~LOC~~ SOAJ: 2.5000 mg | SUBCUTANEOUS | 0 refills | Status: DC

## 2022-09-14 NOTE — Progress Notes (Signed)
RN left voicemail for call back to assess any navigator needs prior to starting radiation treatment.

## 2022-09-14 NOTE — Addendum Note (Signed)
Addended by: Herminio Commons A on: 09/14/2022 02:05 PM   Modules accepted: Orders

## 2022-09-14 NOTE — Progress Notes (Signed)
Subjective:   HPI  Ryan LICO [name] is a 67 y.o. male who presents for Chief Complaint  Patient presents with   fasting cpe    Fasting cpe, no concerns    Patient Care Team: Calieb Lichtman, Kermit Balo, PA-C as PCP - General (Family Medicine) Cherlyn Cushing, RN as Oncology Nurse Navigator Joline Maxcy, MD as Referring Physician (Urology) Emilie Rutter, PA, vascular surgery Dr. Erick Blinks, GI Eye doctor Dentist   Concerns: Recent visit with urology, prostate biopsies.  He has started radiation for prostate cancer.  Compliant with his medications in general    Reviewed their medical, surgical, family, social, medication, and allergy history and updated chart as appropriate.  No Known Allergies  Past Medical History:  Diagnosis Date   Aortic atherosclerosis (HCC)    Diabetes mellitus without complication (HCC)    Family history of colon cancer    father -questionable   Hyperlipidemia    Hypertension    previous HTN with 400lb weight   Obesity    has been up to 400lb   Pneumonia    Prostate cancer (HCC) 2024   Wears glasses     Current Outpatient Medications on File Prior to Visit  Medication Sig Dispense Refill   aspirin EC 81 MG tablet Take 1 tablet (81 mg total) by mouth daily. Swallow whole. 90 tablet 3   Cholecalciferol (VITAMIN D-3) 25 MCG (1000 UT) CAPS Take by mouth.     furosemide (LASIX) 20 MG tablet Take 1 tablet (20 mg total) by mouth daily. 90 tablet 1   metFORMIN (GLUCOPHAGE) 500 MG tablet TAKE 1 TABLET BY MOUTH EVERY DAY WITH BREAKFAST 90 tablet 1   potassium chloride (KLOR-CON) 10 MEQ tablet Take 1 tablet (10 mEq total) by mouth daily. 90 tablet 3   rosuvastatin (CRESTOR) 10 MG tablet Take 1 tablet (10 mg total) by mouth daily. 90 tablet 1   valsartan-hydrochlorothiazide (DIOVAN-HCT) 320-12.5 MG tablet Take 1 tablet by mouth daily. 90 tablet 3   No current facility-administered medications on file prior to visit.      Current Outpatient Medications:     aspirin EC 81 MG tablet, Take 1 tablet (81 mg total) by mouth daily. Swallow whole., Disp: 90 tablet, Rfl: 3   Cholecalciferol (VITAMIN D-3) 25 MCG (1000 UT) CAPS, Take by mouth., Disp: , Rfl:    furosemide (LASIX) 20 MG tablet, Take 1 tablet (20 mg total) by mouth daily., Disp: 90 tablet, Rfl: 1   metFORMIN (GLUCOPHAGE) 500 MG tablet, TAKE 1 TABLET BY MOUTH EVERY DAY WITH BREAKFAST, Disp: 90 tablet, Rfl: 1   potassium chloride (KLOR-CON) 10 MEQ tablet, Take 1 tablet (10 mEq total) by mouth daily., Disp: 90 tablet, Rfl: 3   rosuvastatin (CRESTOR) 10 MG tablet, Take 1 tablet (10 mg total) by mouth daily., Disp: 90 tablet, Rfl: 1   tirzepatide (MOUNJARO) 2.5 MG/0.5ML Pen, Inject 2.5 mg into the skin once a week., Disp: 2 mL, Rfl: 0   tirzepatide (MOUNJARO) 5 MG/0.5ML Pen, Inject 5 mg into the skin once a week., Disp: 6 mL, Rfl: 0   valsartan-hydrochlorothiazide (DIOVAN-HCT) 320-12.5 MG tablet, Take 1 tablet by mouth daily., Disp: 90 tablet, Rfl: 3  Family History  Problem Relation Age of Onset   Cancer Mother 80       metastases-unaware as to type of ca   Obesity Mother    Colon cancer Father 54       questionable   Liver disease Father  Alcohol abuse Father    Heart disease Neg Hx    Hypertension Neg Hx    Hyperlipidemia Neg Hx    Stroke Neg Hx    Diabetes Neg Hx    Esophageal cancer Neg Hx    Rectal cancer Neg Hx     Past Surgical History:  Procedure Laterality Date   PROSTATE BIOPSY     unremarkable      Review of Systems  Constitutional:  Negative for chills, fever, malaise/fatigue and weight loss.  HENT:  Negative for congestion, ear pain, hearing loss, sore throat and tinnitus.   Eyes:  Negative for blurred vision, pain and redness.  Respiratory:  Negative for cough, hemoptysis and shortness of breath.   Cardiovascular:  Negative for chest pain, palpitations, orthopnea, claudication and leg swelling.  Gastrointestinal:  Negative for abdominal pain, blood in stool,  constipation, diarrhea, nausea and vomiting.  Genitourinary:  Negative for dysuria, flank pain, frequency, hematuria and urgency.  Musculoskeletal:  Negative for falls, joint pain and myalgias.  Skin:  Negative for itching and rash.  Neurological:  Negative for dizziness, tingling, speech change, weakness and headaches.  Endo/Heme/Allergies:  Negative for polydipsia. Does not bruise/bleed easily.  Psychiatric/Behavioral:  Negative for depression and memory loss. The patient is not nervous/anxious and does not have insomnia.        Objective:  BP 118/70   Pulse 85   Ht 6\' 3"  (1.905 m)   Wt (!) 392 lb 6.4 oz (178 kg)   BMI 49.05 kg/m   General appearance: alert, no distress, WD/WN, African American male Skin: no new worrisome lesions HEENT: normocephalic, conjunctiva/corneas normal, sclerae anicteric, PERRLA, EOMi, nares patent, no discharge or erythema, pharynx normal Oral cavity: MMM, tongue normal, teeth in good repair Neck: supple, no lymphadenopathy, no thyromegaly, no masses, normal ROM, no bruits Chest: non tender, normal shape and expansion Heart: RRR, normal S1, S2, no murmurs Lungs: CTA bilaterally, no wheezes, rhonchi, or rales Abdomen: +bs, soft, non tender, non distended, no masses, no hepatomegaly, no splenomegaly, no bruits Back: non tender, normal ROM, no scoliosis Musculoskeletal: upper extremities non tender, no obvious deformity, normal ROM throughout, lower extremities non tender, no obvious deformity, normal ROM throughout Extremities: Moderate varicose veins bilateral lower extremities, scarring and darker discoloration on the left lateral lower leg from venous stasis disease and prior ulceration , no edema, no cyanosis, no clubbing Pulses: 2+ symmetric, upper and lower extremities, normal cap refill Neurological: alert, oriented x 3, CN2-12 intact, strength normal upper extremities and lower extremities, sensation normal throughout, DTRs 2+ throughout, no  cerebellar signs, gait normal Psychiatric: normal affect, behavior normal, pleasant  GU/rectal - deferred/declined   Diabetic Foot Exam - Simple   Simple Foot Form Diabetic Foot exam was performed with the following findings: Yes 09/14/2022 12:26 PM  Visual Inspection See comments: Yes Sensation Testing Intact to touch and monofilament testing bilaterally: Yes Pulse Check Posterior Tibialis and Dorsalis pulse intact bilaterally: Yes Comments Thickened tissue callus tissue of several distal toes, flat feet, no other skin lesions      Assessment and Plan :   Encounter Diagnoses  Name Primary?   Encounter for health maintenance examination in adult Yes   Essential hypertension, benign    Hyperlipidemia, unspecified hyperlipidemia type    Vaccine counseling    Screen for colon cancer    Aortic atherosclerosis (HCC)    Impaired fasting blood sugar    Vitamin D deficiency    Need for Tdap vaccination  Varicose veins of both lower extremities, unspecified whether complicated     This visit was a preventative care visit, also known as wellness visit or routine physical.   Topics typically include healthy lifestyle, diet, exercise, preventative care, vaccinations, sick and well care, proper use of emergency dept and after hours care, as well as other concerns.     Separate significant issues discussed: Recent diagnosis of prostate cancer-status postradiation therapy initiated recently.  Glad he finally decided to go see urology after prior encouragement to go but declined in the past year.  Aortic atherosclerosis-continue statin  Hyperlipidemia-continue statin, labs today  Hypertension-at goal, continue current medicines  Diabetes-add Mounjaro.  Discussed risk and benefits of medication.  In a month we will plan on stopping metformin   General Recommendations: Continue to return yearly for your annual wellness and preventative care visits.  This gives [place] a chance to  discuss healthy lifestyle, exercise, vaccinations, review your chart record, and perform screenings where appropriate.  I recommend you see your eye doctor yearly for routine vision care.  I recommend you see your dentist yearly for routine dental care including hygiene visits twice yearly.   Vaccination  Immunization History  Administered Date(s) Administered   Influenza,inj,Quad PF,6+ Mos 04/11/2014, 03/30/2017   PPD Test 06/07/2012   Tdap 06/07/2012    Vaccine recommendations: Tdap Shingrix  Vaccines administered today: Counseled on the Tdap (tetanus, diptheria, and acellular pertussis) vaccine.  Vaccine information sheet given. Tdap vaccine given after consent obtained.    Screening for cancer: Colon cancer screening: Given recent history of prostate cancer diagnosis he wants to hold off on colon cancer screening at this time.  He has never had colon cancer screening despite prior recommendation   Skin cancer screening: Check your skin regularly for new changes, growing lesions, or other lesions of concern Come in for evaluation if you have skin lesions of concern.   Lung cancer screening: If you have a greater than 20 pack year history of tobacco use, then you may qualify for lung cancer screening with a chest CT scan.   Please call your insurance company to inquire about coverage for this test.   Pancreatic cancer:  no current screening test is available or routinely recommended. (risk factors: smoking, overweight or obese, diabetes, chronic pancreatitis, work exposure - dry cleaning, metal working, 65yo>, M>F, Tree surgeon, family hx/o, hereditary breast, ovarian, melanoma, lynch, peutz-jeghers).  Symptoms: jaundice, dark urine, light color or greasy stools, itchy skin, belly or back pain, weight loss, poor appetite, nause, vomiting, liver enlargement, DVT/blood clots.   We currently don't have screenings for other cancers besides breast, cervical, colon, and lung  cancers.  If you have a strong family history of cancer or have other cancer screening concerns, please let me know.  Genetic testing referral is an option for individuals with high cancer risk in the family.  There are some other cancer screenings in development currently.   Bone health: Get at least 150 minutes of aerobic exercise weekly Get weight bearing exercise at least once weekly Bone density test:  A bone density test is an imaging test that uses a type of X-ray to measure the amount of calcium and other minerals in your bones. The test may be used to diagnose or screen you for a condition that causes weak or thin bones (osteoporosis), predict your risk for a broken bone (fracture), or determine how well your osteoporosis treatment is working. The bone density test is recommended for females 65 and  older, or females or males <65 if certain risk factors such as thyroid disease, long term use of steroids such as for asthma or rheumatological issues, vitamin D deficiency, estrogen deficiency, family history of osteoporosis, self or family history of fragility fracture in first degree relative.    Heart health: Get at least 150 minutes of aerobic exercise weekly Limit alcohol It is important to maintain a healthy blood pressure and healthy cholesterol numbers  Heart disease screening: Screening for heart disease includes screening for blood pressure, fasting lipids, glucose/diabetes screening, BMI height to weight ratio, reviewed of smoking status, physical activity, and diet.    Goals include blood pressure 120/80 or less, maintaining a healthy lipid/cholesterol profile, preventing diabetes or keeping diabetes numbers under good control, not smoking or using tobacco products, exercising most days per week or at least 150 minutes per week of exercise, and eating healthy variety of fruits and vegetables, healthy oils, and avoiding unhealthy food choices like fried food, fast food, high sugar  and high cholesterol foods.    Other tests may possibly include EKG test, CT coronary calcium score, echocardiogram, exercise treadmill stress test.      Vascular disease screening: For higher risk individuals including smokers, diabetics, patients with known heart disease or high blood pressure, kidney disease, and others, screening for vascular disease or atherosclerosis of the arteries is available.  Examples may include carotid ultrasound, abdominal aortic ultrasound, ABI blood flow screening in the legs, thoracic aorta screening.   Medical care options: I recommend you continue to seek care here first for routine care.  We try really hard to have available appointments Monday through Friday daytime hours for sick visits, acute visits, and physicals.  Urgent care should be used for after hours and weekends for significant issues that cannot wait till the next day.  The emergency department should be used for significant potentially life-threatening emergencies.  The emergency department is expensive, can often have long wait times for less significant concerns, so try to utilize primary care, urgent care, or telemedicine when possible to avoid unnecessary trips to the emergency department.  Virtual visits and telemedicine have been introduced since the pandemic started in 2020, and can be convenient ways to receive medical care.  We offer virtual appointments as well to assist you in a variety of options to seek medical care.   Legal  Take the time to do a last will and testament, Advanced Directives including Health Care Power of Attorney and Living Will documents.  Don't leave your family with burdens that can be handled ahead of time.   Advanced Directives: I recommend you consider completing a Health Care Power of Attorney and Living Will.   These documents respect your wishes and help alleviate burdens on your loved ones if you were to become terminally ill or be in a position to need  those documents enforced.    You can complete Advanced Directives yourself, have them notarized, then have copies made for our office, for you and for anybody you feel should have them in safe keeping.  Or, you can have an attorney prepare these documents.   If you haven't updated your Last Will and Testament in a while, it may be worthwhile having an attorney prepare these documents together and save on some costs.       Spiritual and Emotional Health Keeping a healthy spiritual life can help you better manage your physical health. Your spiritual life can help you to cope with any issues that  may arise with your physical health.  Balance can keep [place] healthy and help [place] to recover.  If you are struggling with your spiritual health there are questions that you may want to ask yourself:  What makes me feel most complete? When do I feel most connected to the rest of the world? Where do I find the most inner strength? What am I doing when I feel whole?  Helpful tips: Being in nature. Some people feel very connected and at peace when they are walking outdoors or are outside. Helping others. Some feel the largest sense of wellbeing when they are of service to others. Being of service can take on many forms. It can be doing volunteer work, being kind to strangers, or offering a hand to a friend in need. Gratitude. Some people find they feel the most connected when they remain grateful. They may make lists of all the things they are grateful for or say a thank you out loud for all they have.    Emotional Health Are you in tune with your emotional health?  Check out this link: http://[email or web address]-love.[email or web address]/    Financial Health Make sure you use a budget for your personal finances Make sure you are insured against risks (health insurance, life insurance, auto insurance, etc) Save more, spend less Set financial goals If you need help in this area, good resources include counseling  through [employer] or other community resources, have a meeting with a Social research officer, government, and a good resource is the [employer]    Brode was seen today for fasting cpe.  Diagnoses and all orders for this visit:  Encounter for health maintenance examination in adult -     Comprehensive metabolic panel -     CBC with Differential/Platelet -     Lipid panel -     Hemoglobin A1c -     VITAMIN D 25 Hydroxy (Vit-D Deficiency, Fractures)  Essential hypertension, benign  Hyperlipidemia, unspecified hyperlipidemia type -     Lipid panel  Vaccine counseling  Screen for colon cancer  Aortic atherosclerosis (HCC)  Impaired fasting blood sugar -     Hemoglobin A1c  Vitamin D deficiency -     VITAMIN D 25 Hydroxy (Vit-D Deficiency, Fractures)  Need for Tdap vaccination  Varicose veins of both lower extremities, unspecified whether complicated  Other orders -     tirzepatide (MOUNJARO) 2.5 MG/0.5ML Pen; Inject 2.5 mg into the skin once a week. -     tirzepatide [facility]) 5 MG/0.5ML Pen; Inject 5 mg into the skin once a week.     Follow-up pending labs, yearly for physical

## 2022-09-15 ENCOUNTER — Encounter: Payer: Self-pay | Admitting: Urology

## 2022-09-15 ENCOUNTER — Ambulatory Visit (INDEPENDENT_AMBULATORY_CARE_PROVIDER_SITE_OTHER): Payer: 59 | Admitting: Urology

## 2022-09-15 ENCOUNTER — Other Ambulatory Visit: Payer: Self-pay | Admitting: Medical

## 2022-09-15 VITALS — BP 141/89 | HR 70

## 2022-09-15 DIAGNOSIS — C61 Malignant neoplasm of prostate: Secondary | ICD-10-CM | POA: Diagnosis not present

## 2022-09-15 LAB — CBC WITH DIFFERENTIAL/PLATELET
Basophils Absolute: 0 10*3/uL (ref 0.0–0.2)
Basos: 1 %
EOS (ABSOLUTE): 0.3 10*3/uL (ref 0.0–0.4)
Eos: 4 %
Hematocrit: 39.2 % (ref 37.5–51.0)
Hemoglobin: 13.2 g/dL (ref 13.0–17.7)
Immature Grans (Abs): 0 10*3/uL (ref 0.0–0.1)
Immature Granulocytes: 0 %
Lymphocytes Absolute: 2.5 10*3/uL (ref 0.7–3.1)
Lymphs: 39 %
MCH: 30.9 pg (ref 26.6–33.0)
MCHC: 33.7 g/dL (ref 31.5–35.7)
MCV: 92 fL (ref 79–97)
Monocytes Absolute: 0.6 10*3/uL (ref 0.1–0.9)
Monocytes: 10 %
Neutrophils Absolute: 3 10*3/uL (ref 1.4–7.0)
Neutrophils: 46 %
Platelets: 232 10*3/uL (ref 150–450)
RBC: 4.27 x10E6/uL (ref 4.14–5.80)
RDW: 12.3 % (ref 11.6–15.4)
WBC: 6.4 10*3/uL (ref 3.4–10.8)

## 2022-09-15 LAB — COMPREHENSIVE METABOLIC PANEL
ALT: 14 IU/L (ref 0–44)
AST: 20 IU/L (ref 0–40)
Albumin/Globulin Ratio: 1.2 (ref 1.2–2.2)
Albumin: 4.4 g/dL (ref 3.9–4.9)
Alkaline Phosphatase: 89 IU/L (ref 44–121)
BUN/Creatinine Ratio: 19 (ref 10–24)
BUN: 22 mg/dL (ref 8–27)
Bilirubin Total: 0.6 mg/dL (ref 0.0–1.2)
CO2: 20 mmol/L (ref 20–29)
Calcium: 9.7 mg/dL (ref 8.6–10.2)
Chloride: 102 mmol/L (ref 96–106)
Creatinine, Ser: 1.17 mg/dL (ref 0.76–1.27)
Globulin, Total: 3.6 g/dL (ref 1.5–4.5)
Glucose: 90 mg/dL (ref 70–99)
Potassium: 5 mmol/L (ref 3.5–5.2)
Sodium: 138 mmol/L (ref 134–144)
Total Protein: 8 g/dL (ref 6.0–8.5)
eGFR: 70 mL/min/{1.73_m2} (ref >59)

## 2022-09-15 LAB — VITAMIN D 25 HYDROXY (VIT D DEFICIENCY, FRACTURES): Vit D, 25-Hydroxy: 17.2 ng/mL — ABNORMAL LOW (ref 30.0–100.0)

## 2022-09-15 LAB — LIPID PANEL
Chol/HDL Ratio: 2.2 ratio (ref 0.0–5.0)
Cholesterol, Total: 144 mg/dL (ref 100–199)
HDL: 66 mg/dL (ref >39)
LDL Chol Calc (NIH): 60 mg/dL (ref 0–99)
Triglycerides: 102 mg/dL (ref 0–149)
VLDL Cholesterol Cal: 18 mg/dL (ref 5–40)

## 2022-09-15 LAB — HEMOGLOBIN A1C
Est. average glucose Bld gHb Est-mCnc: 120 mg/dL
Hgb A1c MFr Bld: 5.8 % — ABNORMAL HIGH (ref 4.8–5.6)

## 2022-09-15 MED ORDER — VITAMIN D (ERGOCALCIFEROL) 1.25 MG (50000 UNIT) PO CAPS: 50000.0000 [IU] | ORAL_CAPSULE | ORAL | 3 refills | Status: DC

## 2022-09-15 MED ORDER — LEUPROLIDE ACETATE (6 MONTH) 45 MG ~~LOC~~ KIT
45.0000 mg | PACK | Freq: Once | SUBCUTANEOUS | Status: AC
  Administered 2022-09-15: 45 mg via SUBCUTANEOUS

## 2022-09-15 NOTE — Progress Notes (Signed)
Diabetes marker stable, liver, kidney and electrolytes normal, blood counts normal, cholesterol looks good.  Vitamin D level is low.  Stop OTC vitamin D and I would like you to begin prescription Vitamin D 50,000 IU weekly.  We will plan to check vitamin D periodically along with calcium, such as repeating labs in 3 months.  Foods that contain vitamin D include seafood such as oysters, shrimp, salmon, herring, cod, egg yolks, mushrooms, milk, and foods fortified with Vitamin D such as orange juice, cereals.  Its also important to get some sun exposure regularly to absorb vitamin D.  Begin Moujaro injection weekly.  Start with 2.5mg  weekly for the first month, then increase to [facility] 5mg  after the first month assuming insurance covers this.  If you are able to start [facility], then stop Metformin.  Recheck in 3 months.

## 2022-09-15 NOTE — Progress Notes (Signed)
Assessment: 1. Malignant neoplasm of prostate [facility])     Plan: Patient will transition today to 69-month [facility].  Will plan on minimum of 18 months given his high risk disease.  He is scheduled to begin RT in June.  He will follow-up at the time of his next [facility] injection.-Sooner if problems arise.  Chief Complaint: Prostate cancer  HPI: [name]. is a 67 y.o. male who presents for continued evaluation of high risk localized prostate cancer. Please see my note dated 06/15/2022 at the time of initial visit for detailed history and exam. Clinically, patient continues to do well.  He denies specific GU complaints.  Prostate cancer summary: Initial diagnosis 07/2022-TRUS/BX PreTx PSA:  45 Pathology: 5/8 cores positive (bilateral involvement) with large volume Gleason 3+4= 7 (8 core biopsy performed due to patient poorly tolerating procedure and very small gland (15ml)   DRE:  ? Difficult to feel due to body habitus IPSS= 2 Fam hx:  possible in father/uncertain  PSMA PET 08/06/2022 IMPRESSION: 1. Increased radiotracer uptake within the right side of the prostate gland compatible with primary prostate neoplasm. 2. 8 mm, right pelvic sidewall lymph node has an SUV max of 3.32. Cannot exclude early nodal metastasis. No additional tracer avid lymph nodes identified. 3. No signs of solid organ metastasis or distant metastatic disease. 4. Aortic Atherosclerosis (ICD10-I70.0).   PSA DATA: 06/2012             0.94 06/2013             2.79 01/2015             5.6 (5% free) 06/2016             6.6 03/2021           28.8 06/2022             45.3 (4.5% free)     Portions of the above documentation were copied from a prior visit for review purposes only.  Allergies: No Known Allergies  PMH: Past Medical History:  Diagnosis Date   Aortic atherosclerosis (HCC)    Diabetes mellitus without complication (HCC)    Family history of colon cancer    father -questionable   Hyperlipidemia     Hypertension    previous HTN with 400lb weight   Obesity    has been up to 400lb   Pneumonia    Prostate cancer (HCC) 2024   Wears glasses     PSH: Past Surgical History:  Procedure Laterality Date   PROSTATE BIOPSY     unremarkable      SH: Social History   Tobacco Use   Smoking status: Never   Smokeless tobacco: Never  Vaping Use   Vaping Use: Never used  Substance Use Topics   Alcohol use: Yes    Comment: occasional only   Drug use: No    ROS: Constitutional:  Negative for fever, chills, weight loss CV: Negative for chest pain, previous MI, hypertension Respiratory:  Negative for shortness of breath, wheezing, sleep apnea, frequent cough GI:  Negative for nausea, vomiting, bloody stool, GERD  PE: BP (!) 141/89   Pulse 70  GENERAL APPEARANCE:  Well appearing, well developed, well nourished, NAD    Results: Results for orders placed or performed in visit on 09/14/22 (from the past 24 hour(s))  Comprehensive metabolic panel   Collection Time: 09/14/22 12:25 PM  Result Value Ref Range   Glucose 90 70 - 99 mg/dL  BUN 22 8 - 27 mg/dL   Creatinine, Ser 1.61 0.76 - 1.27 mg/dL   eGFR 70 >09 UE/AVW/0.98   BUN/Creatinine Ratio 19 10 - 24   Sodium 138 134 - 144 mmol/L   Potassium 5.0 3.5 - 5.2 mmol/L   Chloride 102 96 - 106 mmol/L   CO2 20 20 - 29 mmol/L   Calcium 9.7 8.6 - 10.2 mg/dL   Total Protein 8.0 6.0 - 8.5 g/dL   Albumin 4.4 3.9 - 4.9 g/dL   Globulin, Total 3.6 1.5 - 4.5 g/dL   Albumin/Globulin Ratio 1.2 1.2 - 2.2   Bilirubin Total 0.6 0.0 - 1.2 mg/dL   Alkaline Phosphatase 89 44 - 121 IU/L   AST 20 0 - 40 IU/L   ALT 14 0 - 44 IU/L  CBC with Differential/Platelet   Collection Time: 09/14/22 12:25 PM  Result Value Ref Range   WBC 6.4 3.4 - 10.8 x10E3/uL   RBC 4.27 4.14 - 5.80 x10E6/uL   Hemoglobin 13.2 13.0 - 17.7 g/dL   Hematocrit 11.9 14.7 - 51.0 %   MCV 92 79 - 97 fL   MCH 30.9 26.6 - 33.0 pg   MCHC 33.7 31.5 - 35.7 g/dL   RDW 82.9  56.2 - 13.0 %   Platelets 232 150 - 450 x10E3/uL   Neutrophils 46 Not Estab. %   Lymphs 39 Not Estab. %   Monocytes 10 Not Estab. %   Eos 4 Not Estab. %   Basos 1 Not Estab. %   Neutrophils Absolute 3.0 1.4 - 7.0 x10E3/uL   Lymphocytes Absolute 2.5 0.7 - 3.1 x10E3/uL   Monocytes Absolute 0.6 0.1 - 0.9 x10E3/uL   EOS (ABSOLUTE) 0.3 0.0 - 0.4 x10E3/uL   Basophils Absolute 0.0 0.0 - 0.2 x10E3/uL   Immature Granulocytes 0 Not Estab. %   Immature Grans (Abs) 0.0 0.0 - 0.1 x10E3/uL  Lipid panel   Collection Time: 09/14/22 12:25 PM  Result Value Ref Range   Cholesterol, Total 144 100 - 199 mg/dL   Triglycerides 865 0 - 149 mg/dL   HDL 66 >78 mg/dL   VLDL Cholesterol Cal 18 5 - 40 mg/dL   LDL Chol Calc (NIH) 60 0 - 99 mg/dL   Chol/HDL Ratio 2.2 0.0 - 5.0 ratio  Hemoglobin A1c   Collection Time: 09/14/22 12:25 PM  Result Value Ref Range   Hgb A1c MFr Bld 5.8 (H) 4.8 - 5.6 %   Est. average glucose Bld gHb Est-mCnc 120 mg/dL  VITAMIN D 25 Hydroxy (Vit-D Deficiency, Fractures)   Collection Time: 09/14/22 12:25 PM  Result Value Ref Range   Vit D, 25-Hydroxy 17.2 (L) 30.0 - 100.0 ng/mL

## 2022-09-15 NOTE — Progress Notes (Signed)
Eligard SubQ Injection   Due to Prostate Cancer patient is present today for a Eligard Injection.  Medication: Eligard 6 month Dose: 45 mg  Location: left  Lot: [id number] Exp: 01/2024  Patient tolerated well, no complications were noted  Performed by: Idelle Crouch., CMA(AAMA)   Patient's next follow up was scheduled for 03/22/23. This appointment was scheduled using wheel and given to patient today along with reminder continue on Vitamin D 800-1000iu and Calcium 1000-1200mg  daily while on Androgen Deprivation Therapy.  PA approval dates:  [id number] 08/18/22 thru 09/15/23

## 2022-10-06 ENCOUNTER — Other Ambulatory Visit: Payer: Self-pay | Admitting: Medical

## 2022-10-07 ENCOUNTER — Telehealth: Payer: Self-pay | Admitting: *Deleted

## 2022-10-07 NOTE — Telephone Encounter (Signed)
This was discontinued by Vincenza Hews . Pt was switched to [facility]

## 2022-10-07 NOTE — Telephone Encounter (Signed)
Called patient to remind ofsim appt.for 10-08-22- arrival time- 1:45 pm @ [facility], lvm for a return call

## 2022-10-08 ENCOUNTER — Ambulatory Visit
Admission: RE | Admit: 2022-10-08 | Discharge: 2022-10-08 | Disposition: A | Discharge status: Home / Self Care | Payer: 59 | Source: Ambulatory Visit | Attending: Radiation Oncology | Admitting: Radiation Oncology

## 2022-10-08 ENCOUNTER — Other Ambulatory Visit: Payer: Self-pay

## 2022-10-08 DIAGNOSIS — C61 Malignant neoplasm of prostate: Secondary | ICD-10-CM | POA: Diagnosis not present

## 2022-10-08 DIAGNOSIS — Z510 Encounter for antineoplastic radiation therapy: Secondary | ICD-10-CM | POA: Insufficient documentation

## 2022-10-08 NOTE — Progress Notes (Signed)
  Radiation Oncology         [phone]) [phone] ________________________________  Name: Ryan Schick [name] MRN: [id number]  Date: 10/08/2022  DOB: [birth year]  SIMULATION AND TREATMENT PLANNING NOTE    ICD-10-CM   1. Malignant neoplasm of prostate (HCC)  C61       DIAGNOSIS:  67 y.o. gentleman with Stage T1c adenocarcinoma of the prostate with Gleason Score of 3+4, and PSA of 45.3.   NARRATIVE:  The patient was brought to the CT Simulation planning suite.  Identity was confirmed.  All relevant records and images related to the planned course of therapy were reviewed.  The patient freely provided informed written consent to proceed with treatment after reviewing the details related to the planned course of therapy. The consent form was witnessed and verified by the simulation staff.  Then, the patient was set-up in a stable reproducible supine position for radiation therapy.  A vacuum lock pillow device was custom fabricated to position his legs in a reproducible immobilized position.  Then, I performed a urethrogram under sterile conditions to identify the prostatic apex.  CT images were obtained.  Surface markings were placed.  The CT images were loaded into the planning software.  Then the prostate target and avoidance structures including the rectum, bladder, bowel and hips were contoured.  Treatment planning then occurred.  The radiation prescription was entered and confirmed.  A total of one complex treatment devices was fabricated. I have requested : Intensity Modulated Radiotherapy (IMRT) is medically necessary for this case for the following reason:  Rectal sparing.Marland Kitchen  PLAN:   The prostate, seminal vesicles, and pelvic lymph nodes will initially be treated to 45 Gy in 25 fractions of 1.8 Gy followed by a boost to the prostate only, to 75 Gy with 15 additional fractions of 2.0 Gy; concurrent with LT-ADT (started 08/16/22).  ________________________________  Artist Pais Kathrynn Running, M.D.

## 2022-10-15 DIAGNOSIS — C61 Malignant neoplasm of prostate: Secondary | ICD-10-CM | POA: Diagnosis not present

## 2022-10-26 ENCOUNTER — Ambulatory Visit: Payer: 59

## 2022-10-27 ENCOUNTER — Other Ambulatory Visit: Payer: Self-pay

## 2022-10-27 ENCOUNTER — Ambulatory Visit
Admission: RE | Admit: 2022-10-27 | Discharge: 2022-10-27 | Disposition: A | Discharge status: Home / Self Care | Payer: 59 | Source: Ambulatory Visit | Attending: Radiation Oncology | Admitting: Radiation Oncology

## 2022-10-27 DIAGNOSIS — C61 Malignant neoplasm of prostate: Secondary | ICD-10-CM | POA: Diagnosis not present

## 2022-10-27 LAB — RAD ONC ARIA SESSION SUMMARY
Course Elapsed Days: 0
Plan Fractions Treated to Date: 1
Plan Prescribed Dose Per Fraction: 1.8 Gy
Plan Total Fractions Prescribed: 25
Plan Total Prescribed Dose: 45 Gy
Reference Point Dosage Given to Date: 1.8 Gy
Reference Point Session Dosage Given: 1.8 Gy
Session Number: 1

## 2022-10-28 ENCOUNTER — Ambulatory Visit
Admission: RE | Admit: 2022-10-28 | Discharge: 2022-10-28 | Disposition: A | Discharge status: Home / Self Care | Payer: 59 | Source: Ambulatory Visit | Attending: Radiation Oncology | Admitting: Radiation Oncology

## 2022-10-28 ENCOUNTER — Other Ambulatory Visit: Payer: Self-pay

## 2022-10-28 DIAGNOSIS — C61 Malignant neoplasm of prostate: Secondary | ICD-10-CM | POA: Diagnosis not present

## 2022-10-28 LAB — RAD ONC ARIA SESSION SUMMARY
Course Elapsed Days: 1
Plan Fractions Treated to Date: 2
Plan Prescribed Dose Per Fraction: 1.8 Gy
Plan Total Fractions Prescribed: 25
Plan Total Prescribed Dose: 45 Gy
Reference Point Dosage Given to Date: 3.6 Gy
Reference Point Session Dosage Given: 1.8 Gy
Session Number: 2

## 2022-10-29 ENCOUNTER — Other Ambulatory Visit: Payer: Self-pay

## 2022-10-29 ENCOUNTER — Ambulatory Visit
Admission: RE | Admit: 2022-10-29 | Discharge: 2022-10-29 | Disposition: A | Discharge status: Home / Self Care | Payer: 59 | Source: Ambulatory Visit | Attending: Radiation Oncology | Admitting: Radiation Oncology

## 2022-10-29 DIAGNOSIS — C61 Malignant neoplasm of prostate: Secondary | ICD-10-CM | POA: Diagnosis not present

## 2022-10-29 LAB — RAD ONC ARIA SESSION SUMMARY
Course Elapsed Days: 2
Plan Fractions Treated to Date: 3
Plan Prescribed Dose Per Fraction: 1.8 Gy
Plan Total Fractions Prescribed: 25
Plan Total Prescribed Dose: 45 Gy
Reference Point Dosage Given to Date: 5.4 Gy
Reference Point Session Dosage Given: 1.8 Gy
Session Number: 3

## 2022-10-30 ENCOUNTER — Telehealth: Payer: Self-pay | Admitting: Internal Medicine

## 2022-10-30 NOTE — Telephone Encounter (Signed)
Insurance will not cover mounjaro has he doesn't have type 2 diabetes.

## 2022-11-01 ENCOUNTER — Ambulatory Visit
Admission: RE | Admit: 2022-11-01 | Discharge: 2022-11-01 | Disposition: A | Discharge status: Home / Self Care | Payer: Medicaid Other | Source: Ambulatory Visit | Attending: Radiation Oncology | Admitting: Radiation Oncology

## 2022-11-01 ENCOUNTER — Other Ambulatory Visit: Payer: Self-pay | Admitting: Internal Medicine

## 2022-11-01 ENCOUNTER — Other Ambulatory Visit: Payer: Self-pay

## 2022-11-01 ENCOUNTER — Other Ambulatory Visit: Payer: Self-pay | Admitting: Medical

## 2022-11-01 DIAGNOSIS — C61 Malignant neoplasm of prostate: Secondary | ICD-10-CM | POA: Diagnosis not present

## 2022-11-01 DIAGNOSIS — Z510 Encounter for antineoplastic radiation therapy: Secondary | ICD-10-CM | POA: Diagnosis not present

## 2022-11-01 LAB — RAD ONC ARIA SESSION SUMMARY
Course Elapsed Days: 5
Plan Fractions Treated to Date: 4
Plan Prescribed Dose Per Fraction: 1.8 Gy
Plan Total Fractions Prescribed: 25
Plan Total Prescribed Dose: 45 Gy
Reference Point Dosage Given to Date: 7.2 Gy
Reference Point Session Dosage Given: 1.8 Gy
Session Number: 4

## 2022-11-01 MED ORDER — ZEPBOUND 2.5 MG/0.5ML ~~LOC~~ SOAJ: 2.5000 mg | SUBCUTANEOUS | 0 refills | Status: DC

## 2022-11-01 MED ORDER — ZEPBOUND 5 MG/0.5ML ~~LOC~~ SOAJ: 5.0000 mg | SUBCUTANEOUS | 1 refills | Status: DC

## 2022-11-01 NOTE — Telephone Encounter (Signed)
Pt was notified.  

## 2022-11-02 ENCOUNTER — Ambulatory Visit
Admission: RE | Admit: 2022-11-02 | Discharge: 2022-11-02 | Disposition: A | Discharge status: Home / Self Care | Payer: Medicaid Other | Source: Ambulatory Visit | Attending: Radiation Oncology | Admitting: Radiation Oncology

## 2022-11-02 ENCOUNTER — Other Ambulatory Visit: Payer: Self-pay

## 2022-11-02 DIAGNOSIS — Z510 Encounter for antineoplastic radiation therapy: Secondary | ICD-10-CM | POA: Diagnosis not present

## 2022-11-02 LAB — RAD ONC ARIA SESSION SUMMARY
Course Elapsed Days: 6
Plan Fractions Treated to Date: 5
Plan Prescribed Dose Per Fraction: 1.8 Gy
Plan Total Fractions Prescribed: 25
Plan Total Prescribed Dose: 45 Gy
Reference Point Dosage Given to Date: 9 Gy
Reference Point Session Dosage Given: 1.8 Gy
Session Number: 5

## 2022-11-03 ENCOUNTER — Other Ambulatory Visit: Payer: Self-pay

## 2022-11-03 ENCOUNTER — Ambulatory Visit
Admission: RE | Admit: 2022-11-03 | Discharge: 2022-11-03 | Disposition: A | Discharge status: Home / Self Care | Payer: Medicaid Other | Source: Ambulatory Visit | Attending: Radiation Oncology | Admitting: Radiation Oncology

## 2022-11-03 DIAGNOSIS — Z510 Encounter for antineoplastic radiation therapy: Secondary | ICD-10-CM | POA: Diagnosis not present

## 2022-11-03 LAB — RAD ONC ARIA SESSION SUMMARY
Course Elapsed Days: 7
Plan Fractions Treated to Date: 6
Plan Prescribed Dose Per Fraction: 1.8 Gy
Plan Total Fractions Prescribed: 25
Plan Total Prescribed Dose: 45 Gy
Reference Point Dosage Given to Date: 10.8 Gy
Reference Point Session Dosage Given: 1.8 Gy
Session Number: 6

## 2022-11-04 ENCOUNTER — Ambulatory Visit: Payer: Medicaid Other

## 2022-11-05 ENCOUNTER — Ambulatory Visit
Admission: RE | Admit: 2022-11-05 | Discharge: 2022-11-05 | Disposition: A | Discharge status: Home / Self Care | Payer: Medicaid Other | Source: Ambulatory Visit | Attending: Radiation Oncology | Admitting: Radiation Oncology

## 2022-11-05 ENCOUNTER — Other Ambulatory Visit: Payer: Self-pay

## 2022-11-05 DIAGNOSIS — Z510 Encounter for antineoplastic radiation therapy: Secondary | ICD-10-CM | POA: Diagnosis not present

## 2022-11-05 LAB — RAD ONC ARIA SESSION SUMMARY
Course Elapsed Days: 9
Plan Fractions Treated to Date: 7
Plan Prescribed Dose Per Fraction: 1.8 Gy
Plan Total Fractions Prescribed: 25
Plan Total Prescribed Dose: 45 Gy
Reference Point Dosage Given to Date: 12.6 Gy
Reference Point Session Dosage Given: 1.8 Gy
Session Number: 7

## 2022-11-08 ENCOUNTER — Ambulatory Visit
Admission: RE | Admit: 2022-11-08 | Discharge: 2022-11-08 | Disposition: A | Discharge status: Home / Self Care | Payer: Medicaid Other | Source: Ambulatory Visit | Attending: Radiation Oncology | Admitting: Radiation Oncology

## 2022-11-08 ENCOUNTER — Other Ambulatory Visit: Payer: Self-pay

## 2022-11-08 DIAGNOSIS — Z510 Encounter for antineoplastic radiation therapy: Secondary | ICD-10-CM | POA: Diagnosis not present

## 2022-11-08 LAB — RAD ONC ARIA SESSION SUMMARY
Course Elapsed Days: 12
Plan Fractions Treated to Date: 8
Plan Prescribed Dose Per Fraction: 1.8 Gy
Plan Total Fractions Prescribed: 25
Plan Total Prescribed Dose: 45 Gy
Reference Point Dosage Given to Date: 14.4 Gy
Reference Point Session Dosage Given: 1.8 Gy
Session Number: 8

## 2022-11-09 ENCOUNTER — Ambulatory Visit
Admission: RE | Admit: 2022-11-09 | Discharge: 2022-11-09 | Disposition: A | Discharge status: Home / Self Care | Payer: Medicaid Other | Source: Ambulatory Visit | Attending: Radiation Oncology | Admitting: Radiation Oncology

## 2022-11-09 ENCOUNTER — Other Ambulatory Visit: Payer: Self-pay

## 2022-11-09 DIAGNOSIS — Z510 Encounter for antineoplastic radiation therapy: Secondary | ICD-10-CM | POA: Diagnosis not present

## 2022-11-09 LAB — RAD ONC ARIA SESSION SUMMARY
Course Elapsed Days: 13
Plan Fractions Treated to Date: 9
Plan Prescribed Dose Per Fraction: 1.8 Gy
Plan Total Fractions Prescribed: 25
Plan Total Prescribed Dose: 45 Gy
Reference Point Dosage Given to Date: 16.2 Gy
Reference Point Session Dosage Given: 1.8 Gy
Session Number: 9

## 2022-11-10 ENCOUNTER — Other Ambulatory Visit: Payer: Self-pay

## 2022-11-10 ENCOUNTER — Ambulatory Visit
Admission: RE | Admit: 2022-11-10 | Discharge: 2022-11-10 | Disposition: A | Discharge status: Home / Self Care | Payer: Medicaid Other | Source: Ambulatory Visit | Attending: Radiation Oncology | Admitting: Radiation Oncology

## 2022-11-10 DIAGNOSIS — Z510 Encounter for antineoplastic radiation therapy: Secondary | ICD-10-CM | POA: Diagnosis not present

## 2022-11-10 LAB — RAD ONC ARIA SESSION SUMMARY
Course Elapsed Days: 14
Plan Fractions Treated to Date: 10
Plan Prescribed Dose Per Fraction: 1.8 Gy
Plan Total Fractions Prescribed: 25
Plan Total Prescribed Dose: 45 Gy
Reference Point Dosage Given to Date: 18 Gy
Reference Point Session Dosage Given: 1.8 Gy
Session Number: 10

## 2022-11-11 ENCOUNTER — Other Ambulatory Visit: Payer: Self-pay

## 2022-11-11 ENCOUNTER — Ambulatory Visit
Admission: RE | Admit: 2022-11-11 | Discharge: 2022-11-11 | Disposition: A | Discharge status: Home / Self Care | Payer: Medicaid Other | Source: Ambulatory Visit | Attending: Radiation Oncology | Admitting: Radiation Oncology

## 2022-11-11 DIAGNOSIS — Z510 Encounter for antineoplastic radiation therapy: Secondary | ICD-10-CM | POA: Diagnosis not present

## 2022-11-11 LAB — RAD ONC ARIA SESSION SUMMARY
Course Elapsed Days: 15
Plan Fractions Treated to Date: 11
Plan Prescribed Dose Per Fraction: 1.8 Gy
Plan Total Fractions Prescribed: 25
Plan Total Prescribed Dose: 45 Gy
Reference Point Dosage Given to Date: 19.8 Gy
Reference Point Session Dosage Given: 1.8 Gy
Session Number: 11

## 2022-11-12 ENCOUNTER — Ambulatory Visit: Payer: Medicaid Other

## 2022-11-15 ENCOUNTER — Other Ambulatory Visit: Payer: Self-pay

## 2022-11-15 ENCOUNTER — Ambulatory Visit
Admission: RE | Admit: 2022-11-15 | Discharge: 2022-11-15 | Disposition: A | Discharge status: Home / Self Care | Payer: Medicaid Other | Source: Ambulatory Visit | Attending: Radiation Oncology

## 2022-11-15 DIAGNOSIS — Z510 Encounter for antineoplastic radiation therapy: Secondary | ICD-10-CM | POA: Diagnosis not present

## 2022-11-15 LAB — RAD ONC ARIA SESSION SUMMARY
Course Elapsed Days: 19
Plan Fractions Treated to Date: 12
Plan Prescribed Dose Per Fraction: 1.8 Gy
Plan Total Fractions Prescribed: 25
Plan Total Prescribed Dose: 45 Gy
Reference Point Dosage Given to Date: 21.6 Gy
Reference Point Session Dosage Given: 1.8 Gy
Session Number: 12

## 2022-11-16 ENCOUNTER — Ambulatory Visit
Admission: RE | Admit: 2022-11-16 | Discharge: 2022-11-16 | Disposition: A | Discharge status: Home / Self Care | Payer: Medicaid Other | Source: Ambulatory Visit | Attending: Radiation Oncology | Admitting: Radiation Oncology

## 2022-11-16 ENCOUNTER — Other Ambulatory Visit: Payer: Self-pay

## 2022-11-16 DIAGNOSIS — Z510 Encounter for antineoplastic radiation therapy: Secondary | ICD-10-CM | POA: Diagnosis not present

## 2022-11-16 LAB — RAD ONC ARIA SESSION SUMMARY
Course Elapsed Days: 20
Plan Fractions Treated to Date: 13
Plan Prescribed Dose Per Fraction: 1.8 Gy
Plan Total Fractions Prescribed: 25
Plan Total Prescribed Dose: 45 Gy
Reference Point Dosage Given to Date: 23.4 Gy
Reference Point Session Dosage Given: 1.8 Gy
Session Number: 13

## 2022-11-17 ENCOUNTER — Other Ambulatory Visit: Payer: Self-pay

## 2022-11-17 ENCOUNTER — Ambulatory Visit
Admission: RE | Admit: 2022-11-17 | Discharge: 2022-11-17 | Disposition: A | Discharge status: Home / Self Care | Payer: Medicaid Other | Source: Ambulatory Visit | Attending: Radiation Oncology | Admitting: Radiation Oncology

## 2022-11-17 DIAGNOSIS — Z510 Encounter for antineoplastic radiation therapy: Secondary | ICD-10-CM | POA: Diagnosis not present

## 2022-11-17 LAB — RAD ONC ARIA SESSION SUMMARY
Course Elapsed Days: 21
Plan Fractions Treated to Date: 14
Plan Prescribed Dose Per Fraction: 1.8 Gy
Plan Total Fractions Prescribed: 25
Plan Total Prescribed Dose: 45 Gy
Reference Point Dosage Given to Date: 25.2 Gy
Reference Point Session Dosage Given: 1.8 Gy
Session Number: 14

## 2022-11-18 ENCOUNTER — Ambulatory Visit
Admission: RE | Admit: 2022-11-18 | Discharge: 2022-11-18 | Disposition: A | Discharge status: Home / Self Care | Payer: Medicaid Other | Source: Ambulatory Visit | Attending: Radiation Oncology | Admitting: Radiation Oncology

## 2022-11-18 ENCOUNTER — Other Ambulatory Visit: Payer: Self-pay

## 2022-11-18 DIAGNOSIS — Z510 Encounter for antineoplastic radiation therapy: Secondary | ICD-10-CM | POA: Diagnosis not present

## 2022-11-18 LAB — RAD ONC ARIA SESSION SUMMARY
Course Elapsed Days: 22
Plan Fractions Treated to Date: 15
Plan Prescribed Dose Per Fraction: 1.8 Gy
Plan Total Fractions Prescribed: 25
Plan Total Prescribed Dose: 45 Gy
Reference Point Dosage Given to Date: 27 Gy
Reference Point Session Dosage Given: 1.8 Gy
Session Number: 15

## 2022-11-19 ENCOUNTER — Other Ambulatory Visit: Payer: Self-pay

## 2022-11-19 ENCOUNTER — Ambulatory Visit
Admission: RE | Admit: 2022-11-19 | Discharge: 2022-11-19 | Disposition: A | Discharge status: Home / Self Care | Payer: Medicaid Other | Source: Ambulatory Visit | Attending: Radiation Oncology | Admitting: Radiation Oncology

## 2022-11-19 DIAGNOSIS — Z510 Encounter for antineoplastic radiation therapy: Secondary | ICD-10-CM | POA: Diagnosis not present

## 2022-11-19 LAB — RAD ONC ARIA SESSION SUMMARY
Course Elapsed Days: 23
Plan Fractions Treated to Date: 16
Plan Prescribed Dose Per Fraction: 1.8 Gy
Plan Total Fractions Prescribed: 25
Plan Total Prescribed Dose: 45 Gy
Reference Point Dosage Given to Date: 28.8 Gy
Reference Point Session Dosage Given: 1.8 Gy
Session Number: 16

## 2022-11-22 ENCOUNTER — Other Ambulatory Visit: Payer: Self-pay

## 2022-11-22 ENCOUNTER — Ambulatory Visit
Admission: RE | Admit: 2022-11-22 | Discharge: 2022-11-22 | Disposition: A | Discharge status: Home / Self Care | Payer: Medicaid Other | Source: Ambulatory Visit | Attending: Radiation Oncology | Admitting: Radiation Oncology

## 2022-11-22 DIAGNOSIS — Z510 Encounter for antineoplastic radiation therapy: Secondary | ICD-10-CM | POA: Diagnosis not present

## 2022-11-22 LAB — RAD ONC ARIA SESSION SUMMARY
Course Elapsed Days: 26
Plan Fractions Treated to Date: 17
Plan Prescribed Dose Per Fraction: 1.8 Gy
Plan Total Fractions Prescribed: 25
Plan Total Prescribed Dose: 45 Gy
Reference Point Dosage Given to Date: 30.6 Gy
Reference Point Session Dosage Given: 1.8 Gy
Session Number: 17

## 2022-11-23 ENCOUNTER — Ambulatory Visit
Admission: RE | Admit: 2022-11-23 | Discharge: 2022-11-23 | Disposition: A | Discharge status: Home / Self Care | Payer: Medicaid Other | Source: Ambulatory Visit | Attending: Radiation Oncology | Admitting: Radiation Oncology

## 2022-11-23 ENCOUNTER — Other Ambulatory Visit: Payer: Self-pay

## 2022-11-23 DIAGNOSIS — Z510 Encounter for antineoplastic radiation therapy: Secondary | ICD-10-CM | POA: Diagnosis not present

## 2022-11-23 LAB — RAD ONC ARIA SESSION SUMMARY
Course Elapsed Days: 27
Plan Fractions Treated to Date: 18
Plan Prescribed Dose Per Fraction: 1.8 Gy
Plan Total Fractions Prescribed: 25
Plan Total Prescribed Dose: 45 Gy
Reference Point Dosage Given to Date: 32.4 Gy
Reference Point Session Dosage Given: 1.8 Gy
Session Number: 18

## 2022-11-24 ENCOUNTER — Ambulatory Visit
Admission: RE | Admit: 2022-11-24 | Discharge: 2022-11-24 | Disposition: A | Discharge status: Home / Self Care | Payer: Medicaid Other | Source: Ambulatory Visit | Attending: Radiation Oncology

## 2022-11-24 ENCOUNTER — Other Ambulatory Visit: Payer: Self-pay

## 2022-11-24 DIAGNOSIS — Z510 Encounter for antineoplastic radiation therapy: Secondary | ICD-10-CM | POA: Diagnosis not present

## 2022-11-24 LAB — RAD ONC ARIA SESSION SUMMARY
Course Elapsed Days: 28
Plan Fractions Treated to Date: 19
Plan Prescribed Dose Per Fraction: 1.8 Gy
Plan Total Fractions Prescribed: 25
Plan Total Prescribed Dose: 45 Gy
Reference Point Dosage Given to Date: 34.2 Gy
Reference Point Session Dosage Given: 1.8 Gy
Session Number: 19

## 2022-11-25 ENCOUNTER — Ambulatory Visit
Admission: RE | Admit: 2022-11-25 | Discharge: 2022-11-25 | Disposition: A | Discharge status: Home / Self Care | Payer: Medicaid Other | Source: Ambulatory Visit | Attending: Radiation Oncology | Admitting: Radiation Oncology

## 2022-11-25 ENCOUNTER — Other Ambulatory Visit: Payer: Self-pay

## 2022-11-25 DIAGNOSIS — Z510 Encounter for antineoplastic radiation therapy: Secondary | ICD-10-CM | POA: Diagnosis not present

## 2022-11-25 LAB — RAD ONC ARIA SESSION SUMMARY
Course Elapsed Days: 29
Plan Fractions Treated to Date: 20
Plan Prescribed Dose Per Fraction: 1.8 Gy
Plan Total Fractions Prescribed: 25
Plan Total Prescribed Dose: 45 Gy
Reference Point Dosage Given to Date: 36 Gy
Reference Point Session Dosage Given: 1.8 Gy
Session Number: 20

## 2022-11-26 ENCOUNTER — Ambulatory Visit
Admission: RE | Admit: 2022-11-26 | Discharge: 2022-11-26 | Disposition: A | Discharge status: Home / Self Care | Payer: Medicaid Other | Source: Ambulatory Visit | Attending: Radiation Oncology | Admitting: Radiation Oncology

## 2022-11-26 ENCOUNTER — Other Ambulatory Visit: Payer: Self-pay

## 2022-11-26 ENCOUNTER — Ambulatory Visit
Admission: RE | Admit: 2022-11-26 | Discharge: 2022-11-26 | Disposition: A | Discharge status: Home / Self Care | Payer: Medicaid Other | Source: Ambulatory Visit | Attending: Radiation Oncology

## 2022-11-26 DIAGNOSIS — Z510 Encounter for antineoplastic radiation therapy: Secondary | ICD-10-CM | POA: Diagnosis not present

## 2022-11-26 LAB — RAD ONC ARIA SESSION SUMMARY
Course Elapsed Days: 30
Plan Fractions Treated to Date: 21
Plan Prescribed Dose Per Fraction: 1.8 Gy
Plan Total Fractions Prescribed: 25
Plan Total Prescribed Dose: 45 Gy
Reference Point Dosage Given to Date: 37.8 Gy
Reference Point Session Dosage Given: 1.8 Gy
Session Number: 21

## 2022-11-29 ENCOUNTER — Ambulatory Visit
Admission: RE | Admit: 2022-11-29 | Discharge: 2022-11-29 | Disposition: A | Discharge status: Home / Self Care | Payer: Medicaid Other | Source: Ambulatory Visit | Attending: Radiation Oncology | Admitting: Radiation Oncology

## 2022-11-29 ENCOUNTER — Other Ambulatory Visit: Payer: Self-pay

## 2022-11-29 DIAGNOSIS — Z510 Encounter for antineoplastic radiation therapy: Secondary | ICD-10-CM | POA: Diagnosis not present

## 2022-11-29 LAB — RAD ONC ARIA SESSION SUMMARY
Course Elapsed Days: 33
Plan Fractions Treated to Date: 22
Plan Prescribed Dose Per Fraction: 1.8 Gy
Plan Total Fractions Prescribed: 25
Plan Total Prescribed Dose: 45 Gy
Reference Point Dosage Given to Date: 39.6 Gy
Reference Point Session Dosage Given: 1.8 Gy
Session Number: 22

## 2022-11-30 ENCOUNTER — Other Ambulatory Visit: Payer: Self-pay

## 2022-11-30 ENCOUNTER — Ambulatory Visit
Admission: RE | Admit: 2022-11-30 | Discharge: 2022-11-30 | Disposition: A | Discharge status: Home / Self Care | Payer: Medicaid Other | Source: Ambulatory Visit | Attending: Radiation Oncology

## 2022-11-30 DIAGNOSIS — Z510 Encounter for antineoplastic radiation therapy: Secondary | ICD-10-CM | POA: Diagnosis not present

## 2022-11-30 LAB — RAD ONC ARIA SESSION SUMMARY
Course Elapsed Days: 34
Plan Fractions Treated to Date: 23
Plan Prescribed Dose Per Fraction: 1.8 Gy
Plan Total Fractions Prescribed: 25
Plan Total Prescribed Dose: 45 Gy
Reference Point Dosage Given to Date: 41.4 Gy
Reference Point Session Dosage Given: 1.8 Gy
Session Number: 23

## 2022-12-01 ENCOUNTER — Other Ambulatory Visit: Payer: Self-pay

## 2022-12-01 ENCOUNTER — Ambulatory Visit: Payer: Medicaid Other

## 2022-12-01 DIAGNOSIS — Z510 Encounter for antineoplastic radiation therapy: Secondary | ICD-10-CM | POA: Diagnosis not present

## 2022-12-01 LAB — RAD ONC ARIA SESSION SUMMARY
Course Elapsed Days: 35
Plan Fractions Treated to Date: 24
Plan Prescribed Dose Per Fraction: 1.8 Gy
Plan Total Fractions Prescribed: 25
Plan Total Prescribed Dose: 45 Gy
Reference Point Dosage Given to Date: 43.2 Gy
Reference Point Session Dosage Given: 1.8 Gy
Session Number: 24

## 2022-12-02 ENCOUNTER — Ambulatory Visit: Payer: Medicaid Other

## 2022-12-02 ENCOUNTER — Other Ambulatory Visit: Payer: Self-pay

## 2022-12-02 DIAGNOSIS — Z510 Encounter for antineoplastic radiation therapy: Secondary | ICD-10-CM | POA: Diagnosis not present

## 2022-12-02 DIAGNOSIS — C61 Malignant neoplasm of prostate: Secondary | ICD-10-CM | POA: Diagnosis not present

## 2022-12-02 LAB — RAD ONC ARIA SESSION SUMMARY
Course Elapsed Days: 36
Plan Fractions Treated to Date: 25
Plan Prescribed Dose Per Fraction: 1.8 Gy
Plan Total Fractions Prescribed: 25
Plan Total Prescribed Dose: 45 Gy
Reference Point Dosage Given to Date: 45 Gy
Reference Point Session Dosage Given: 1.8 Gy
Session Number: 25

## 2022-12-03 ENCOUNTER — Other Ambulatory Visit: Payer: Self-pay

## 2022-12-03 ENCOUNTER — Ambulatory Visit
Admission: RE | Admit: 2022-12-03 | Discharge: 2022-12-03 | Disposition: A | Discharge status: Home / Self Care | Payer: Medicaid Other | Source: Ambulatory Visit | Attending: Radiation Oncology | Admitting: Radiation Oncology

## 2022-12-03 DIAGNOSIS — Z510 Encounter for antineoplastic radiation therapy: Secondary | ICD-10-CM | POA: Diagnosis not present

## 2022-12-03 LAB — RAD ONC ARIA SESSION SUMMARY
Course Elapsed Days: 37
Plan Fractions Treated to Date: 1
Plan Prescribed Dose Per Fraction: 2 Gy
Plan Total Fractions Prescribed: 15
Plan Total Prescribed Dose: 30 Gy
Reference Point Dosage Given to Date: 2 Gy
Reference Point Session Dosage Given: 2 Gy
Session Number: 26

## 2022-12-06 ENCOUNTER — Other Ambulatory Visit: Payer: Self-pay

## 2022-12-06 ENCOUNTER — Ambulatory Visit
Admission: RE | Admit: 2022-12-06 | Discharge: 2022-12-06 | Disposition: A | Discharge status: Home / Self Care | Payer: Medicaid Other | Source: Ambulatory Visit | Attending: Radiation Oncology | Admitting: Radiation Oncology

## 2022-12-06 DIAGNOSIS — Z510 Encounter for antineoplastic radiation therapy: Secondary | ICD-10-CM | POA: Diagnosis not present

## 2022-12-06 LAB — RAD ONC ARIA SESSION SUMMARY
Course Elapsed Days: 40
Plan Fractions Treated to Date: 2
Plan Prescribed Dose Per Fraction: 2 Gy
Plan Total Fractions Prescribed: 15
Plan Total Prescribed Dose: 30 Gy
Reference Point Dosage Given to Date: 4 Gy
Reference Point Session Dosage Given: 2 Gy
Session Number: 27

## 2022-12-07 ENCOUNTER — Other Ambulatory Visit: Payer: Self-pay

## 2022-12-07 ENCOUNTER — Other Ambulatory Visit: Payer: Self-pay | Admitting: Medical

## 2022-12-07 ENCOUNTER — Ambulatory Visit
Admission: RE | Admit: 2022-12-07 | Discharge: 2022-12-07 | Disposition: A | Discharge status: Home / Self Care | Payer: Medicaid Other | Source: Ambulatory Visit | Attending: Radiation Oncology

## 2022-12-07 DIAGNOSIS — Z510 Encounter for antineoplastic radiation therapy: Secondary | ICD-10-CM | POA: Diagnosis not present

## 2022-12-07 LAB — RAD ONC ARIA SESSION SUMMARY
Course Elapsed Days: 41
Plan Fractions Treated to Date: 3
Plan Prescribed Dose Per Fraction: 2 Gy
Plan Total Fractions Prescribed: 15
Plan Total Prescribed Dose: 30 Gy
Reference Point Dosage Given to Date: 6 Gy
Reference Point Session Dosage Given: 2 Gy
Session Number: 28

## 2022-12-07 NOTE — Telephone Encounter (Signed)
Per notes, pt is not suppose to be on metformin if on mounjaro. [name] is on med list.

## 2022-12-08 ENCOUNTER — Other Ambulatory Visit: Payer: Self-pay

## 2022-12-08 ENCOUNTER — Ambulatory Visit
Admission: RE | Admit: 2022-12-08 | Discharge: 2022-12-08 | Disposition: A | Discharge status: Home / Self Care | Payer: Medicaid Other | Source: Ambulatory Visit | Attending: Radiation Oncology | Admitting: Radiation Oncology

## 2022-12-08 DIAGNOSIS — Z510 Encounter for antineoplastic radiation therapy: Secondary | ICD-10-CM | POA: Diagnosis not present

## 2022-12-08 LAB — RAD ONC ARIA SESSION SUMMARY
Course Elapsed Days: 42
Plan Fractions Treated to Date: 4
Plan Prescribed Dose Per Fraction: 2 Gy
Plan Total Fractions Prescribed: 15
Plan Total Prescribed Dose: 30 Gy
Reference Point Dosage Given to Date: 8 Gy
Reference Point Session Dosage Given: 2 Gy
Session Number: 29

## 2022-12-09 ENCOUNTER — Ambulatory Visit: Payer: Medicaid Other

## 2022-12-10 ENCOUNTER — Other Ambulatory Visit: Payer: Self-pay

## 2022-12-10 ENCOUNTER — Ambulatory Visit
Admission: RE | Admit: 2022-12-10 | Discharge: 2022-12-10 | Disposition: A | Discharge status: Home / Self Care | Payer: Medicaid Other | Source: Ambulatory Visit | Attending: Radiation Oncology | Admitting: Radiation Oncology

## 2022-12-10 ENCOUNTER — Ambulatory Visit: Payer: Medicaid Other

## 2022-12-10 DIAGNOSIS — Z510 Encounter for antineoplastic radiation therapy: Secondary | ICD-10-CM | POA: Diagnosis not present

## 2022-12-10 LAB — RAD ONC ARIA SESSION SUMMARY
Course Elapsed Days: 44
Plan Fractions Treated to Date: 5
Plan Prescribed Dose Per Fraction: 2 Gy
Plan Total Fractions Prescribed: 15
Plan Total Prescribed Dose: 30 Gy
Reference Point Dosage Given to Date: 10 Gy
Reference Point Session Dosage Given: 2 Gy
Session Number: 30

## 2022-12-13 ENCOUNTER — Other Ambulatory Visit: Payer: Self-pay

## 2022-12-13 ENCOUNTER — Ambulatory Visit
Admission: RE | Admit: 2022-12-13 | Discharge: 2022-12-13 | Disposition: A | Discharge status: Home / Self Care | Payer: Medicaid Other | Source: Ambulatory Visit | Attending: Radiation Oncology | Admitting: Radiation Oncology

## 2022-12-13 DIAGNOSIS — Z510 Encounter for antineoplastic radiation therapy: Secondary | ICD-10-CM | POA: Diagnosis not present

## 2022-12-13 LAB — RAD ONC ARIA SESSION SUMMARY
Course Elapsed Days: 47
Plan Fractions Treated to Date: 6
Plan Prescribed Dose Per Fraction: 2 Gy
Plan Total Fractions Prescribed: 15
Plan Total Prescribed Dose: 30 Gy
Reference Point Dosage Given to Date: 12 Gy
Reference Point Session Dosage Given: 2 Gy
Session Number: 31

## 2022-12-14 ENCOUNTER — Other Ambulatory Visit: Payer: Self-pay

## 2022-12-14 ENCOUNTER — Ambulatory Visit
Admission: RE | Admit: 2022-12-14 | Discharge: 2022-12-14 | Disposition: A | Discharge status: Home / Self Care | Payer: Medicaid Other | Source: Ambulatory Visit | Attending: Radiation Oncology

## 2022-12-14 DIAGNOSIS — Z510 Encounter for antineoplastic radiation therapy: Secondary | ICD-10-CM | POA: Diagnosis not present

## 2022-12-14 LAB — RAD ONC ARIA SESSION SUMMARY
Course Elapsed Days: 48
Plan Fractions Treated to Date: 7
Plan Prescribed Dose Per Fraction: 2 Gy
Plan Total Fractions Prescribed: 15
Plan Total Prescribed Dose: 30 Gy
Reference Point Dosage Given to Date: 14 Gy
Reference Point Session Dosage Given: 2 Gy
Session Number: 32

## 2022-12-15 ENCOUNTER — Ambulatory Visit
Admission: RE | Admit: 2022-12-15 | Discharge: 2022-12-15 | Disposition: A | Discharge status: Home / Self Care | Payer: Medicaid Other | Source: Ambulatory Visit | Attending: Radiation Oncology

## 2022-12-15 ENCOUNTER — Other Ambulatory Visit: Payer: Self-pay

## 2022-12-15 DIAGNOSIS — Z510 Encounter for antineoplastic radiation therapy: Secondary | ICD-10-CM | POA: Diagnosis not present

## 2022-12-15 LAB — RAD ONC ARIA SESSION SUMMARY
Course Elapsed Days: 49
Plan Fractions Treated to Date: 8
Plan Prescribed Dose Per Fraction: 2 Gy
Plan Total Fractions Prescribed: 15
Plan Total Prescribed Dose: 30 Gy
Reference Point Dosage Given to Date: 16 Gy
Reference Point Session Dosage Given: 2 Gy
Session Number: 33

## 2022-12-16 ENCOUNTER — Ambulatory Visit
Admission: RE | Admit: 2022-12-16 | Discharge: 2022-12-16 | Disposition: A | Discharge status: Home / Self Care | Payer: Medicaid Other | Source: Ambulatory Visit | Attending: Radiation Oncology | Admitting: Radiation Oncology

## 2022-12-16 ENCOUNTER — Other Ambulatory Visit: Payer: Self-pay

## 2022-12-16 DIAGNOSIS — Z510 Encounter for antineoplastic radiation therapy: Secondary | ICD-10-CM | POA: Diagnosis not present

## 2022-12-16 LAB — RAD ONC ARIA SESSION SUMMARY
Course Elapsed Days: 50
Plan Fractions Treated to Date: 9
Plan Prescribed Dose Per Fraction: 2 Gy
Plan Total Fractions Prescribed: 15
Plan Total Prescribed Dose: 30 Gy
Reference Point Dosage Given to Date: 18 Gy
Reference Point Session Dosage Given: 2 Gy
Session Number: 34

## 2022-12-17 ENCOUNTER — Other Ambulatory Visit: Payer: Self-pay

## 2022-12-17 ENCOUNTER — Ambulatory Visit
Admission: RE | Admit: 2022-12-17 | Discharge: 2022-12-17 | Disposition: A | Discharge status: Home / Self Care | Payer: Medicaid Other | Source: Ambulatory Visit | Attending: Radiation Oncology | Admitting: Radiation Oncology

## 2022-12-17 DIAGNOSIS — Z510 Encounter for antineoplastic radiation therapy: Secondary | ICD-10-CM | POA: Diagnosis not present

## 2022-12-17 LAB — RAD ONC ARIA SESSION SUMMARY
Course Elapsed Days: 51
Plan Fractions Treated to Date: 10
Plan Prescribed Dose Per Fraction: 2 Gy
Plan Total Fractions Prescribed: 15
Plan Total Prescribed Dose: 30 Gy
Reference Point Dosage Given to Date: 20 Gy
Reference Point Session Dosage Given: 2 Gy
Session Number: 35

## 2022-12-20 ENCOUNTER — Ambulatory Visit
Admission: RE | Admit: 2022-12-20 | Discharge: 2022-12-20 | Disposition: A | Discharge status: Home / Self Care | Payer: Medicaid Other | Source: Ambulatory Visit | Attending: Radiation Oncology | Admitting: Radiation Oncology

## 2022-12-20 ENCOUNTER — Other Ambulatory Visit: Payer: Self-pay

## 2022-12-20 DIAGNOSIS — Z510 Encounter for antineoplastic radiation therapy: Secondary | ICD-10-CM | POA: Diagnosis not present

## 2022-12-20 LAB — RAD ONC ARIA SESSION SUMMARY
Course Elapsed Days: 54
Plan Fractions Treated to Date: 11
Plan Prescribed Dose Per Fraction: 2 Gy
Plan Total Fractions Prescribed: 15
Plan Total Prescribed Dose: 30 Gy
Reference Point Dosage Given to Date: 22 Gy
Reference Point Session Dosage Given: 2 Gy
Session Number: 36

## 2022-12-21 ENCOUNTER — Ambulatory Visit: Payer: BC Managed Care – PPO | Admitting: Medical

## 2022-12-21 ENCOUNTER — Ambulatory Visit: Payer: Medicaid Other

## 2022-12-21 ENCOUNTER — Ambulatory Visit
Admission: RE | Admit: 2022-12-21 | Discharge: 2022-12-21 | Disposition: A | Discharge status: Home / Self Care | Payer: Medicaid Other | Source: Ambulatory Visit | Attending: Radiation Oncology

## 2022-12-21 ENCOUNTER — Other Ambulatory Visit: Payer: Self-pay

## 2022-12-21 ENCOUNTER — Encounter: Payer: Self-pay | Admitting: Medical

## 2022-12-21 VITALS — BP 132/86 | HR 79 | Ht 75.0 in | Wt 388.0 lb

## 2022-12-21 DIAGNOSIS — E559 Vitamin D deficiency, unspecified: Secondary | ICD-10-CM

## 2022-12-21 DIAGNOSIS — I10 Essential (primary) hypertension: Secondary | ICD-10-CM

## 2022-12-21 DIAGNOSIS — C61 Malignant neoplasm of prostate: Secondary | ICD-10-CM

## 2022-12-21 DIAGNOSIS — R7301 Impaired fasting glucose: Secondary | ICD-10-CM | POA: Diagnosis not present

## 2022-12-21 DIAGNOSIS — E785 Hyperlipidemia, unspecified: Secondary | ICD-10-CM

## 2022-12-21 DIAGNOSIS — Z510 Encounter for antineoplastic radiation therapy: Secondary | ICD-10-CM | POA: Diagnosis not present

## 2022-12-21 LAB — RAD ONC ARIA SESSION SUMMARY
Course Elapsed Days: 55
Plan Fractions Treated to Date: 12
Plan Prescribed Dose Per Fraction: 2 Gy
Plan Total Fractions Prescribed: 15
Plan Total Prescribed Dose: 30 Gy
Reference Point Dosage Given to Date: 24 Gy
Reference Point Session Dosage Given: 2 Gy
Session Number: 37

## 2022-12-21 MED ORDER — WEGOVY 0.25 MG/0.5ML ~~LOC~~ SOAJ: 0.2500 mg | SUBCUTANEOUS | 0 refills | Status: DC

## 2022-12-21 MED ORDER — WEGOVY 0.5 MG/0.5ML ~~LOC~~ SOAJ: 0.5000 mg | SUBCUTANEOUS | 0 refills | Status: DC

## 2022-12-21 NOTE — Progress Notes (Signed)
Subjective:  Ryan HENIGE [name] is a 67 y.o. male who presents for Chief Complaint  Patient presents with   Follow-up    3 month follow up, no new concerns. Looks like [name] was not covered and Zepbound was sent on 11/01/22 he never heard anything from pharmacy-but his insurance changed 7/5 to [employer].      Here for f/u.  Last visit vitamin D was low.  We initiated vitamin D 50k weekly which he is taking without c/o  Last visit we wanted to begin medicaiton for weight loss zepbound or mounjaro.  [name] initially denied by insurance, but he or our office never got any word on whether zepbound  was covered or not.  He is exercising, walking regularly  He is begin treated for prostate cancer.  Been getting radiation therapy for several weeks.  Last radiation treatment this week  Compliant with his other medicaiton as usual  No other aggravating or relieving factors.    No other c/o.   Past Medical History:  Diagnosis Date   Aortic atherosclerosis (HCC)    Family history of colon cancer    father -questionable   Hyperlipidemia    Hypertension    previous HTN with 400lb weight   Obesity    has been up to 400lb   Pneumonia    Prostate cancer (HCC) 2024   Wears glasses    Current Outpatient Medications on File Prior to Visit  Medication Sig Dispense Refill   aspirin EC 81 MG tablet Take 1 tablet (81 mg total) by mouth daily. Swallow whole. 90 tablet 3   furosemide (LASIX) 20 MG tablet TAKE 1 TABLET BY MOUTH DAILY 90 tablet 0   potassium chloride (KLOR-CON) 10 MEQ tablet Take 1 tablet (10 mEq total) by mouth daily. 90 tablet 3   rosuvastatin (CRESTOR) 10 MG tablet TAKE 1 TABLET BY MOUTH DAILY 90 tablet 0   valsartan-hydrochlorothiazide (DIOVAN-HCT) 320-12.5 MG tablet Take 1 tablet by mouth daily. 90 tablet 3   Vitamin D, Ergocalciferol, (DRISDOL) 1.25 MG (50000 UNIT) CAPS capsule Take 1 capsule (50,000 Units total) by mouth every 7 (seven) days. 12 capsule 3   tirzepatide  (ZEPBOUND) 2.5 MG/0.5ML Pen Inject 2.5 mg into the skin once a week. (Patient not taking: Reported on 12/21/2022) 2 mL 0   tirzepatide (ZEPBOUND) 5 MG/0.5ML Pen Inject 5 mg into the skin once a week. (Patient not taking: Reported on 12/21/2022) 2 mL 1   No current facility-administered medications on file prior to visit.     The following portions of the patient's history were reviewed and updated as appropriate: allergies, current medications, past family history, past medical history, past social history, past surgical history and problem list.  ROS Otherwise as in subjective above    Objective: BP 132/86   Pulse 79   Ht 6\' 3"  (1.905 m)   Wt (!) 388 lb (176 kg)   SpO2 96%   BMI 48.50 kg/m   General appearance: alert, no distress, well developed, well nourished HEENT: normocephalic, sclerae anicteric, conjunctiva pink and moist, TMs pearly, nares patent, no discharge or erythema, pharynx normal Oral cavity: MMM, no lesions Neck: supple, no lymphadenopathy, no thyromegaly, no masses Heart: RRR, normal S1, S2, no murmurs Lungs: CTA bilaterally, no wheezes, rhonchi, or rales Abdomen: +bs, soft, non tender, non distended, no masses, no hepatomegaly, no splenomegaly Pulses: 2+ radial pulses, 2+ pedal pulses, normal cap refill Ext: no edema     Assessment: Encounter Diagnoses  Name  Primary?   Vitamin D deficiency Yes   Malignant neoplasm of prostate (HCC)    Essential hypertension, benign    Impaired fasting blood sugar    Hyperlipidemia, unspecified hyperlipidemia type      Plan: Vitamin D deficiency - lab today, continue Vit D 50k weekly supplement  Prostate cancer - seeing urology and oncology.  Continue follow up, and wished him well with his last radiation treatment this week  HTN - continue Valsartan HCT 320/12.5mg  daily, lasix 20mg  daily  Hyperlipidemia - continue Crestor 10mg  daily, aspirin 81mg  daily  Obesity/ BMI 48 - try again with medication to help with  weight loss.  Continue healthy eating habits and exercise he is doing.  Will watch for prior authorization   Kailin was seen today for follow-up.  Diagnoses and all orders for this visit:  Vitamin D deficiency -     VITAMIN D 25 Hydroxy (Vit-D Deficiency, Fractures)  Malignant neoplasm of prostate (HCC)  Essential hypertension, benign  Impaired fasting blood sugar  Hyperlipidemia, unspecified hyperlipidemia type  Other orders -     Semaglutide-Weight Management (WEGOVY) 0.25 MG/0.5ML SOAJ; Inject 0.25 mg into the skin once a week. -     Semaglutide-Weight Management (WEGOVY) 0.5 MG/0.5ML SOAJ; Inject 0.5 mg into the skin once a week.    Follow up: pending labs and call back

## 2022-12-22 ENCOUNTER — Other Ambulatory Visit: Payer: Self-pay

## 2022-12-22 ENCOUNTER — Ambulatory Visit
Admission: RE | Admit: 2022-12-22 | Discharge: 2022-12-22 | Disposition: A | Discharge status: Home / Self Care | Payer: Medicaid Other | Source: Ambulatory Visit | Attending: Radiation Oncology | Admitting: Radiation Oncology

## 2022-12-22 ENCOUNTER — Other Ambulatory Visit: Payer: Self-pay | Admitting: Medical

## 2022-12-22 ENCOUNTER — Ambulatory Visit: Payer: Medicaid Other

## 2022-12-22 ENCOUNTER — Telehealth: Payer: Self-pay

## 2022-12-22 DIAGNOSIS — Z510 Encounter for antineoplastic radiation therapy: Secondary | ICD-10-CM | POA: Diagnosis not present

## 2022-12-22 LAB — RAD ONC ARIA SESSION SUMMARY
Course Elapsed Days: 56
Plan Fractions Treated to Date: 13
Plan Prescribed Dose Per Fraction: 2 Gy
Plan Total Fractions Prescribed: 15
Plan Total Prescribed Dose: 30 Gy
Reference Point Dosage Given to Date: 26 Gy
Reference Point Session Dosage Given: 2 Gy
Session Number: 38

## 2022-12-22 LAB — VITAMIN D 25 HYDROXY (VIT D DEFICIENCY, FRACTURES): Vit D, 25-Hydroxy: 40.6 ng/mL (ref 30.0–100.0)

## 2022-12-22 NOTE — Telephone Encounter (Signed)
PA waiting for approval from insurance (Key: [id number])

## 2022-12-22 NOTE — Progress Notes (Signed)
Vitamin D improved. Continue Vitamin D supplement.  Begin the weight loss injectable medication.

## 2022-12-23 ENCOUNTER — Ambulatory Visit: Payer: 59

## 2022-12-23 ENCOUNTER — Ambulatory Visit: Payer: Medicaid Other

## 2022-12-23 ENCOUNTER — Other Ambulatory Visit: Payer: Self-pay

## 2022-12-23 ENCOUNTER — Ambulatory Visit
Admission: RE | Admit: 2022-12-23 | Discharge: 2022-12-23 | Disposition: A | Discharge status: Home / Self Care | Payer: Medicaid Other | Source: Ambulatory Visit | Attending: Radiation Oncology | Admitting: Radiation Oncology

## 2022-12-23 DIAGNOSIS — Z510 Encounter for antineoplastic radiation therapy: Secondary | ICD-10-CM | POA: Diagnosis not present

## 2022-12-23 LAB — RAD ONC ARIA SESSION SUMMARY
Course Elapsed Days: 57
Plan Fractions Treated to Date: 14
Plan Prescribed Dose Per Fraction: 2 Gy
Plan Total Fractions Prescribed: 15
Plan Total Prescribed Dose: 30 Gy
Reference Point Dosage Given to Date: 28 Gy
Reference Point Session Dosage Given: 2 Gy
Session Number: 39

## 2022-12-23 NOTE — Telephone Encounter (Signed)
Pt notified of approval. Approval faxed to pharmacy [facility])

## 2022-12-23 NOTE — Telephone Encounter (Signed)
Jjesus Hoggle (Key: [id number]) Rx #: [id number] Wegovy 0.25MG /0.5ML auto-injectors  PA Case: [id number],  Status: Approved,  Coverage Starts on: 12/22/2022 12:00:00 AM,  Coverage Ends on: 06/20/2023 12:00:00 AM..  Authorization Expiration Date: June 20, 2023.

## 2022-12-24 ENCOUNTER — Ambulatory Visit: Payer: Medicaid Other

## 2022-12-24 ENCOUNTER — Ambulatory Visit
Admission: RE | Admit: 2022-12-24 | Discharge: 2022-12-24 | Disposition: A | Discharge status: Home / Self Care | Payer: Medicaid Other | Source: Ambulatory Visit | Attending: Radiation Oncology | Admitting: Radiation Oncology

## 2022-12-24 ENCOUNTER — Other Ambulatory Visit: Payer: Self-pay

## 2022-12-24 DIAGNOSIS — Z510 Encounter for antineoplastic radiation therapy: Secondary | ICD-10-CM | POA: Diagnosis not present

## 2022-12-24 DIAGNOSIS — C61 Malignant neoplasm of prostate: Secondary | ICD-10-CM

## 2022-12-24 LAB — RAD ONC ARIA SESSION SUMMARY
Course Elapsed Days: 58
Plan Fractions Treated to Date: 15
Plan Prescribed Dose Per Fraction: 2 Gy
Plan Total Fractions Prescribed: 15
Plan Total Prescribed Dose: 30 Gy
Reference Point Dosage Given to Date: 30 Gy
Reference Point Session Dosage Given: 2 Gy
Session Number: 40

## 2022-12-27 ENCOUNTER — Ambulatory Visit: Payer: Medicaid Other

## 2022-12-27 NOTE — Radiation Completion Notes (Addendum)
Radiation Oncology         [phone]) [phone] ________________________________  Name: [name] MRN: [id number]  Date: 12/24/2022  DOB: [birth year]  Patient Name: [name], [name] MRN: [id number] Date of Birth: 1956-03-07 Referring Physician: Marcha Solders, M.D. Date of Service: 2022-12-27 Radiation Oncologist: Margaretmary Bayley, M.D. [facility]     RADIATION ONCOLOGY END OF TREATMENT NOTE     Diagnosis: 67 y.o. gentleman with Stage T1c adenocarcinoma of the prostate with Gleason score of 3+4, and PSA of 45.3.   Intent: Curative     ==========DELIVERED PLANS==========  First Treatment Date: 2022-10-27 - Last Treatment Date: 2022-12-24   Plan Name: Prostate_Pelv Site: Prostate Technique: IMRT Mode: Photon Dose Per Fraction: 1.8 Gy Prescribed Dose (Delivered / Prescribed): 45 Gy / 45 Gy Prescribed Fxs (Delivered / Prescribed): 25 / 25   Plan Name: Prostate_Bst Site: Prostate Technique: IMRT Mode: Photon Dose Per Fraction: 2 Gy Prescribed Dose (Delivered / Prescribed): 30 Gy / 30 Gy Prescribed Fxs (Delivered / Prescribed): 15 / 15     ==========ON TREATMENT VISIT DATES========== 2022-10-29, 2022-11-05, 2022-11-19, 2022-11-26, 2022-12-03, 2022-12-17, 2022-12-24     See weekly On Treatment Notes in Epic for details.  He tolerated the radiation treatments fairly well with some increased nocturia and modest fatigue.  The patient will receive a call in about one month from the radiation oncology department. He will continue follow up with his urologist, Dr. Margo Aye, as well.  ------------------------------------------------   Margaretmary Dys, MD [facility]  Radiation Oncology Direct Dial: [phone]  Fax: [phone] [email or web address]  Skype  [email or web address]

## 2022-12-28 ENCOUNTER — Other Ambulatory Visit: Payer: Self-pay | Admitting: Medical

## 2022-12-29 NOTE — Progress Notes (Signed)
Patient was a RadOnc Consult on 08/17/22 for his stage T1c adenocarcinoma of the prostate with Gleason score of 3+4, and PSA of 45.3.  Patient proceed with treatment recommendations of 8 weeks of external beam therapy concurrent with LT-ADT (started 08/16/22) and had his final radiation treatment on 12/24/2022.   Patient is scheduled for a post treatment nurse call on 02/08/23 and has ongoing follow up's with urology on 03/22/23 for additional Eligard.

## 2023-01-08 ENCOUNTER — Other Ambulatory Visit: Payer: Self-pay | Admitting: Medical

## 2023-01-12 ENCOUNTER — Other Ambulatory Visit: Payer: Self-pay | Admitting: Medical

## 2023-01-12 ENCOUNTER — Telehealth: Payer: Self-pay | Admitting: Medical

## 2023-01-12 NOTE — Telephone Encounter (Signed)
[name] declined medication since he will be starting on Wegovy

## 2023-01-12 NOTE — Telephone Encounter (Signed)
Patient called re Metformin refill request from pharmacy, states pharmacy has sent request but no response. Advised pt per Martie Lee that he does not need to take Metformin with the injectable medication. He states that he has [place] but has not started it yet but he plans to start it on Monday

## 2023-01-18 ENCOUNTER — Telehealth: Payer: Self-pay

## 2023-01-18 NOTE — Telephone Encounter (Signed)
Updated pt on status of PA. States he is worried about the side effects and would like to schedule an appt next month to discuss with Vincenza Hews. Still has 4 pens from previous rx.

## 2023-01-18 NOTE — Telephone Encounter (Signed)
Key: [id number] Rx #: [id number] Drug: JYNWGN 0.5MG /0.5ML auto-injectors Form: [employer] Electronic PA Form [phone] NCPDP) Message from Plan: Submit Bill To Other Processor Or Primary Payer

## 2023-01-18 NOTE — Telephone Encounter (Signed)
Spoke with insurance on 01/17/16. They will allow rx to go through for 24 hrs. Called pharmacy to notify. Rx goes through but is on temporary back order from demand. [facility]ed [facility] [address].

## 2023-02-08 ENCOUNTER — Ambulatory Visit
Admission: RE | Admit: 2023-02-08 | Discharge: 2023-02-08 | Disposition: A | Discharge status: Home / Self Care | Payer: Medicaid Other | Source: Ambulatory Visit | Attending: Radiation Oncology | Admitting: Radiation Oncology

## 2023-02-08 NOTE — Progress Notes (Signed)
Radiation Oncology         [phone]) [phone] ________________________________  Name: Ryan Schick [name] MRN: [id number]  Date of Service: 02/08/2023  DOB: 1956/02/09  Post Treatment Telephone Note  Diagnosis:  C61 Malignant neoplasm of prostate (as documented in provider EOT note)  Pre Treatment IPSS Score: 2 (as documented in the provider consult note)  The patient was available for call today.   Symptoms of fatigue have improved since completing therapy.  Symptoms of bladder changes have improved since completing therapy. Current symptoms include none, and medications for bladder symptoms include none.  Symptoms of bowel changes have improved since completing therapy. Current symptoms include none, and medications for bowel symptoms include none.   Post Treatment IPSS Score: IPSS Questionnaire (AUA-7): Over the past month.   1)  How often have you had a sensation of not emptying your bladder completely after you finish urinating?  0 - Not at all  2)  How often have you had to urinate again less than two hours after you finished urinating? 0 - Not at all  3)  How often have you found you stopped and started again several times when you urinated?  0 - Not at all  4) How difficult have you found it to postpone urination?  0 - Not at all  5) How often have you had a weak urinary stream?  0 - Not at all  6) How often have you had to push or strain to begin urination?  0 - Not at all  7) How many times did you most typically get up to urinate from the time you went to bed until the time you got up in the morning?  1 - 1 time  Total score:  1. Which indicates mild symptoms  0-7 mildly symptomatic   8-19 moderately symptomatic   20-35 severely symptomatic    Patient has a scheduled follow up visit with his urologist, Dr. Margo Aye, on 03/2023 for ongoing surveillance. He was counseled that PSA levels will be drawn in the urology office, and was reassured that additional time is expected to  improve bowel and bladder symptoms. He was encouraged to call back with concerns or questions regarding radiation.  This concludes the interaction.  Ruel Favors, LPN

## 2023-02-10 ENCOUNTER — Other Ambulatory Visit: Payer: Self-pay | Admitting: Urology

## 2023-02-10 DIAGNOSIS — C61 Malignant neoplasm of prostate: Secondary | ICD-10-CM

## 2023-02-22 ENCOUNTER — Encounter: Payer: Self-pay | Admitting: *Deleted

## 2023-03-10 ENCOUNTER — Encounter: Payer: Self-pay | Admitting: *Deleted

## 2023-03-10 ENCOUNTER — Inpatient Hospital Stay: Payer: Medicare Other | Attending: Adult Health | Admitting: *Deleted

## 2023-03-10 DIAGNOSIS — C61 Malignant neoplasm of prostate: Secondary | ICD-10-CM

## 2023-03-10 NOTE — Progress Notes (Signed)
  SCP reviewed and completed.  Pt will get post-tx treatment PSA labs in November at [facility]. A copy of prostate oncology treatment summary has been forwarded to PCP.

## 2023-03-22 ENCOUNTER — Ambulatory Visit (INDEPENDENT_AMBULATORY_CARE_PROVIDER_SITE_OTHER): Payer: Medicare Other | Admitting: Urology

## 2023-03-22 ENCOUNTER — Encounter: Payer: Self-pay | Admitting: Urology

## 2023-03-22 DIAGNOSIS — C61 Malignant neoplasm of prostate: Secondary | ICD-10-CM

## 2023-03-22 MED ORDER — LEUPROLIDE ACETATE (6 MONTH) 45 MG ~~LOC~~ KIT
45.0000 mg | PACK | Freq: Once | SUBCUTANEOUS | Status: AC
Start: 2023-03-22 — End: 2023-03-22
  Administered 2023-03-22: 45 mg via SUBCUTANEOUS

## 2023-03-22 NOTE — Addendum Note (Signed)
Addended by: Malva Cogan L on: 03/22/2023 11:45 AM   Modules accepted: Orders

## 2023-03-22 NOTE — Progress Notes (Signed)
   Assessment: 1. Malignant neoplasm of prostate [facility])     Plan: 68-month LHRH administered today with plans on 18 months of therapy. Follow-up 6 months PSA today  Chief Complaint: Prostate cancer  HPI: [name]. is a 67 y.o. male who presents for continued evaluation of prostate cancer.  Patient states that he is doing very well tolerating ADT without much difficulty.  He does have some mild hot flashes but tolerates them well.   Please see my note dated 06/15/2022 at the time of initial visit for detailed history and exam.    Prostate cancer summary: Initial diagnosis 07/2022-TRUS/BX PreTx PSA:  45 Pathology: 5/8 cores positive (bilateral involvement) with large volume Gleason 3+4= 7 (8 core biopsy performed due to patient poorly tolerating procedure and very small gland (15ml)   DRE:  ? Difficult to feel due to body habitus IPSS= 2 Fam hx:  possible in father/uncertain   PSMA PET 08/06/2022 IMPRESSION: 1. Increased radiotracer uptake within the right side of the prostate gland compatible with primary prostate neoplasm. 2. 8 mm, right pelvic sidewall lymph node has an SUV max of 3.32. Cannot exclude early nodal metastasis. No additional tracer avid lymph nodes identified. 3. No signs of solid organ metastasis or distant metastatic disease. 4. Aortic Atherosclerosis (ICD10-I70.0).   PSA DATA: 06/2012             0.94 06/2013             2.79 01/2015             5.6 (5% free) 06/2016             6.6 03/2021           28.8 06/2022             45.3 (4.5% free) 12/2022  completed RT    Portions of the above documentation were copied from a prior visit for review purposes only.  Allergies: No Known Allergies  PMH: Past Medical History:  Diagnosis Date   Aortic atherosclerosis (HCC)    Family history of colon cancer    father -questionable   Hyperlipidemia    Hypertension    previous HTN with 400lb weight   Obesity    has been up to 400lb   Pneumonia     Prostate cancer (HCC) 2024   Wears glasses     PSH: Past Surgical History:  Procedure Laterality Date   PROSTATE BIOPSY     unremarkable      SH: Social History   Tobacco Use   Smoking status: Never   Smokeless tobacco: Never  Vaping Use   Vaping status: Never Used  Substance Use Topics   Alcohol use: Yes    Comment: occasional only   Drug use: No    ROS: Constitutional:  Negative for fever, chills, weight loss CV: Negative for chest pain, previous MI, hypertension Respiratory:  Negative for shortness of breath, wheezing, sleep apnea, frequent cough GI:  Negative for nausea, vomiting, bloody stool, GERD  PE: There were no vitals taken for this visit. GENERAL APPEARANCE:  Well appearing, well developed, well nourished, NAD

## 2023-03-22 NOTE — Progress Notes (Signed)
Eligard SubQ Injection   Due to Prostate Cancer patient is present today for a Eligard Injection.  Medication: Eligard 6 month Dose: 45 mg  Location: left  Lot: [id number] Exp: 04/2024  Patient tolerated well, no complications were noted  Performed by: Idelle Crouch., CMA(AAMA)   Patient's next follow up was scheduled for May 2025. This appointment was scheduled using wheel and given to patient today along with reminder continue on Vitamin D 800-1000iu and Calcium 1000-1200mg  daily while on Androgen Deprivation Therapy.  PA approval dates:  [id number]  08/18/22 thru 09/15/23

## 2023-03-23 LAB — PSA: Prostate Specific Ag, Serum: 1.6 ng/mL (ref 0.0–4.0)

## 2023-03-26 ENCOUNTER — Other Ambulatory Visit: Payer: Self-pay | Admitting: Medical

## 2023-06-25 ENCOUNTER — Other Ambulatory Visit: Payer: Self-pay | Admitting: Medical

## 2023-09-01 ENCOUNTER — Ambulatory Visit: Admitting: Urology

## 2023-09-06 ENCOUNTER — Other Ambulatory Visit: Payer: Self-pay | Admitting: Medical

## 2023-09-06 ENCOUNTER — Ambulatory Visit: Payer: Medicare Other | Admitting: Urology

## 2023-09-18 ENCOUNTER — Other Ambulatory Visit: Payer: Self-pay | Admitting: Medical

## 2023-09-19 NOTE — Telephone Encounter (Signed)
 Last checked 12/21/22 does pt. Need to remain on this.

## 2023-09-27 NOTE — Progress Notes (Signed)
   Assessment: No diagnosis found.   Plan: 66-month LHRH administered today with plans on 18 months of therapy. Follow-up 6 months PSA today  Chief Complaint: Prostate cancer  HPI: [name]. is a 68 y.o. male who presents for continued evaluation of prostate cancer.  Patient states that he is doing very well tolerating ADT without much difficulty.  He does have some mild hot flashes but tolerates them well.   Please see my note dated 06/15/2022 at the time of initial visit for detailed history and exam.    Prostate cancer summary: Initial diagnosis 07/2022-TRUS/BX PreTx PSA:  45 Pathology: 5/8 cores positive (bilateral involvement) with large volume Gleason 3+4= 7 (8 core biopsy performed due to patient poorly tolerating procedure and very small gland (15ml)   DRE:  ? Difficult to feel due to body habitus IPSS= 2 Fam hx:  possible in father/uncertain   PSMA PET 08/06/2022 IMPRESSION: 1. Increased radiotracer uptake within the right side of the prostate gland compatible with primary prostate neoplasm. 2. 8 mm, right pelvic sidewall lymph node has an SUV max of 3.32. Cannot exclude early nodal metastasis. No additional tracer avid lymph nodes identified. 3. No signs of solid organ metastasis or distant metastatic disease. 4. Aortic Atherosclerosis (ICD10-I70.0).   PSA DATA: 06/2012             0.94 06/2013             2.79 01/2015             5.6 (5% free) 06/2016             6.6 03/2021           28.8 06/2022             45.3 (4.5% free) 12/24/2022  completed RT (40 IMRT treatments)    Portions of the above documentation were copied from a prior visit for review purposes only.  Allergies: No Known Allergies  PMH: Past Medical History:  Diagnosis Date   Aortic atherosclerosis (HCC)    Family history of colon cancer    father -questionable   Hyperlipidemia    Hypertension    previous HTN with 400lb weight   Obesity    has been up to 400lb   Pneumonia     Prostate cancer (HCC) 2024   Wears glasses     PSH: Past Surgical History:  Procedure Laterality Date   PROSTATE BIOPSY     unremarkable      SH: Social History   Tobacco Use   Smoking status: Never   Smokeless tobacco: Never  Vaping Use   Vaping status: Never Used  Substance Use Topics   Alcohol use: Yes    Comment: occasional only   Drug use: No    ROS: Constitutional:  Negative for fever, chills, weight loss CV: Negative for chest pain, previous MI, hypertension Respiratory:  Negative for shortness of breath, wheezing, sleep apnea, frequent cough GI:  Negative for nausea, vomiting, bloody stool, GERD  PE: There were no vitals taken for this visit. GENERAL APPEARANCE:  Well appearing, well developed, well nourished, NAD

## 2023-09-28 ENCOUNTER — Ambulatory Visit (INDEPENDENT_AMBULATORY_CARE_PROVIDER_SITE_OTHER): Admitting: Urology

## 2023-09-28 ENCOUNTER — Encounter: Payer: Self-pay | Admitting: Urology

## 2023-09-28 VITALS — BP 163/90 | HR 83 | Ht 75.0 in | Wt 340.0 lb

## 2023-09-28 DIAGNOSIS — Z79899 Other long term (current) drug therapy: Secondary | ICD-10-CM | POA: Diagnosis not present

## 2023-09-28 DIAGNOSIS — C61 Malignant neoplasm of prostate: Secondary | ICD-10-CM

## 2023-09-28 MED ORDER — LEUPROLIDE ACETATE (6 MONTH) 45 MG ~~LOC~~ KIT
45.0000 mg | PACK | Freq: Once | SUBCUTANEOUS | Status: AC
  Administered 2023-09-28: 45 mg via SUBCUTANEOUS

## 2023-09-28 NOTE — Progress Notes (Signed)
 Eligard  SubQ Injection   Due to Prostate Cancer patient is present today for a Eligard  Injection.  Medication: Eligard  6 month Dose: 45 mg  Location: left  Lot: [id number] Exp: 12/2024  Patient tolerated well, no complications were noted  Performed by: C. Candee Cha, LPN   Per Dr. Joie Narrow patient is to continue therapy for 6 months . This appointment was scheduled using wheel and given to patient today along with reminder continue on Vitamin D  800-1000iu and Calcium  1000-1200mg  daily while on Androgen Deprivation Therapy.  PA approval dates:

## 2023-09-29 LAB — PSA: Prostate Specific Ag, Serum: 1.2 ng/mL (ref 0.0–4.0)

## 2023-09-29 LAB — TESTOSTERONE: Testosterone: 72 ng/dL — ABNORMAL LOW (ref 264–916)

## 2023-10-03 ENCOUNTER — Ambulatory Visit: Payer: Self-pay

## 2023-12-11 ENCOUNTER — Other Ambulatory Visit: Payer: Self-pay | Admitting: Medical

## 2023-12-12 NOTE — Telephone Encounter (Signed)
 Ok to refill potassium.

## 2023-12-12 NOTE — Telephone Encounter (Signed)
 Cpe scheduled 01/11/24

## 2023-12-12 NOTE — Telephone Encounter (Signed)
 Please schedule patient for CPE per shane

## 2024-01-04 ENCOUNTER — Other Ambulatory Visit: Payer: Self-pay | Admitting: Medical

## 2024-01-11 ENCOUNTER — Encounter: Payer: Self-pay | Admitting: Medical

## 2024-01-11 ENCOUNTER — Ambulatory Visit (INDEPENDENT_AMBULATORY_CARE_PROVIDER_SITE_OTHER): Admitting: Medical

## 2024-01-11 VITALS — BP 110/70 | Ht 74.0 in | Wt >= 6400 oz

## 2024-01-11 DIAGNOSIS — Z8546 Personal history of malignant neoplasm of prostate: Secondary | ICD-10-CM | POA: Insufficient documentation

## 2024-01-11 DIAGNOSIS — I8393 Asymptomatic varicose veins of bilateral lower extremities: Secondary | ICD-10-CM

## 2024-01-11 DIAGNOSIS — I872 Venous insufficiency (chronic) (peripheral): Secondary | ICD-10-CM

## 2024-01-11 DIAGNOSIS — Z1211 Encounter for screening for malignant neoplasm of colon: Secondary | ICD-10-CM

## 2024-01-11 DIAGNOSIS — Z7185 Encounter for immunization safety counseling: Secondary | ICD-10-CM

## 2024-01-11 DIAGNOSIS — Z23 Encounter for immunization: Secondary | ICD-10-CM | POA: Diagnosis not present

## 2024-01-11 DIAGNOSIS — E559 Vitamin D deficiency, unspecified: Secondary | ICD-10-CM | POA: Diagnosis not present

## 2024-01-11 DIAGNOSIS — I10 Essential (primary) hypertension: Secondary | ICD-10-CM

## 2024-01-11 DIAGNOSIS — E785 Hyperlipidemia, unspecified: Secondary | ICD-10-CM

## 2024-01-11 DIAGNOSIS — R7301 Impaired fasting glucose: Secondary | ICD-10-CM | POA: Diagnosis not present

## 2024-01-11 DIAGNOSIS — Z0000 Encounter for general adult medical examination without abnormal findings: Secondary | ICD-10-CM

## 2024-01-11 DIAGNOSIS — M545 Low back pain, unspecified: Secondary | ICD-10-CM

## 2024-01-11 DIAGNOSIS — I700 Atherosclerosis of aorta: Secondary | ICD-10-CM

## 2024-01-11 DIAGNOSIS — M62830 Muscle spasm of back: Secondary | ICD-10-CM

## 2024-01-11 LAB — LIPID PANEL

## 2024-01-11 MED ORDER — TIZANIDINE HCL 4 MG PO TABS: 4.0000 mg | ORAL_TABLET | Freq: Two times a day (BID) | ORAL | 0 refills | Status: AC | PRN

## 2024-01-11 NOTE — Patient Instructions (Signed)
 Prostate cancer Diagnosed with Gleason score 3+4=7. On leuprolide  injections. - Next leuprolide  injection due in November. - Follow-up with urologist Dr. Matilda.  Right lower back pain Significant pain with movement, affecting sleep. Differential includes musculoskeletal strain or pressure-related pain. -You can use Tizanidine  muscle relaxer as needed once or twice daily for spasm and pain.   This can cause drowsiness.  - work on stretching, avoid heavy lifting  -If not improving in the next week, lets consider xray  Essential hypertension Blood pressure management ongoing. - Continue Valsartan  HCT 320/12.5 mg daily and Lasix  20 mg daily.  pre diabetes mellitus No glucometer use reported. Due for A1c testing. - Continue metformin  500 mg daily. - Order A1c test.  Hyperlipidemia Lipid management ongoing. - Continue Crestor  10 mg daily and aspirin  81 mg daily.  Obesity Weight gain noted since last visit.  Aortic atherosclerosis Cholesterol buildup in the aorta noted on previous PET scan.  Vitamin D  deficiency - Continue vitamin D  supplement 50,000 units weekly. - Order vitamin D  level.  Adult Wellness Visit Discussed general health maintenance, including dental and eye care, and vaccinations. - Recommend annual dental and eye exams. - Administer flu vaccine today. - Discuss pneumococcal vaccine; he may choose to receive it today or at a later date.       This visit was a preventative care visit, also known as wellness visit or routine physical.   Topics typically include healthy lifestyle, diet, exercise, preventative care, vaccinations, sick and well care, proper use of emergency dept and after hours care, as well as other concerns.     Recommendations: Continue to return yearly for your annual wellness and preventative care visits.  This gives us  a chance to discuss healthy lifestyle, exercise, vaccinations, review your chart record, and perform screenings where  appropriate.  I recommend you see your eye doctor yearly for routine vision care.  I recommend you see your dentist yearly for routine dental care including hygiene visits twice yearly.   Vaccination recommendations were reviewed Immunization History  Administered Date(s) Administered   Influenza,inj,Quad PF,6+ Mos 04/11/2014, 03/30/2017   PPD Test 06/07/2012   Tdap 06/07/2012, 07/19/2022, 09/14/2022   Counseled on the influenza virus vaccine.  Vaccine information sheet given.  Influenza vaccine given after consent obtained.  Counseled on Shingrix , prevnar vaccines   Screening for cancer: Colon cancer screening: Declines for now.  No prior screening . He has a lot of fear related to diagnosis of prostate cancer last year.  Skin cancer screening: Check your skin regularly for new changes, growing lesions, or other lesions of concern Come in for evaluation if you have skin lesions of concern.  Lung cancer screening: If you have a greater than 20 pack year history of tobacco use, then you may qualify for lung cancer screening with a chest CT scan.   Please call your insurance company to inquire about coverage for this test.  We currently don't have screenings for other cancers besides breast, cervical, colon, and lung cancers.  If you have a strong family history of cancer or have other cancer screening concerns, please let me know.    Bone health: Get at least 150 minutes of aerobic exercise weekly Get weight bearing exercise at least once weekly Bone density test:  A bone density test is an imaging test that uses a type of X-ray to measure the amount of calcium  and other minerals in your bones. The test may be used to diagnose or screen you for a  condition that causes weak or thin bones (osteoporosis), predict your risk for a broken bone (fracture), or determine how well your osteoporosis treatment is working. The bone density test is recommended for females 65 and older, or  females or males <65 if certain risk factors such as thyroid disease, long term use of steroids such as for asthma or rheumatological issues, vitamin D  deficiency, estrogen deficiency, family history of osteoporosis, self or family history of fragility fracture in first degree relative.    Heart health: Get at least 150 minutes of aerobic exercise weekly Limit alcohol It is important to maintain a healthy blood pressure and healthy cholesterol numbers  Heart disease screening: Screening for heart disease includes screening for blood pressure, fasting lipids, glucose/diabetes screening, BMI height to weight ratio, reviewed of smoking status, physical activity, and diet.    Goals include blood pressure 120/80 or less, maintaining a healthy lipid/cholesterol profile, preventing diabetes or keeping diabetes numbers under good control, not smoking or using tobacco products, exercising most days per week or at least 150 minutes per week of exercise, and eating healthy variety of fruits and vegetables, healthy oils, and avoiding unhealthy food choices like fried food, fast food, high sugar and high cholesterol foods.    Other tests may possibly include EKG test, CT coronary calcium  score, echocardiogram, exercise treadmill stress test.    Medical care options: I recommend you continue to seek care here first for routine care.  We try really hard to have available appointments Monday through Friday daytime hours for sick visits, acute visits, and physicals.  Urgent care should be used for after hours and weekends for significant issues that cannot wait till the next day.  The emergency department should be used for significant potentially life-threatening emergencies.  The emergency department is expensive, can often have long wait times for less significant concerns, so try to utilize primary care, urgent care, or telemedicine when possible to avoid unnecessary trips to the emergency department.  Virtual  visits and telemedicine have been introduced since the pandemic started in 2020, and can be convenient ways to receive medical care.  We offer virtual appointments as well to assist you in a variety of options to seek medical care.

## 2024-01-11 NOTE — Progress Notes (Signed)
 Name: Ryan OLIVENCIA [name]   Date of Visit: 01/11/24   Date of last visit with me: 01/04/2024   CHIEF COMPLAINT:  Chief Complaint  Patient presents with   Annual Exam    Fasting cpe, flu shot today, back pain       HPI:  Discussed the use of AI scribe software for clinical note transcription with the patient, who gave verbal consent to proceed.  History of Present Illness   Ryan Folmar. is a 68 year old male who presents for a well visit.  He has been experiencing significant pain in his right lower back for the past couple of weeks, which worsens with movement and when getting up from a seated position. The pain does not radiate to his buttocks or legs, and there are no tingling sensations in his legs. He describes the pain as a pressure and notes difficulty sleeping due to the discomfort.  His medical history includes hypertension, high cholesterol, diabetes, aortic atherosclerosis, obesity, and prostate cancer diagnosed in March 2024 with a Gleason score of 3+4=7. A PET scan in April 2024 was performed, and the patient recalls that it mainly focused on the prostate and a right pelvic lymph node. He is currently receiving leuprolide  injections every six months, with the next one due in November.  His current medications include Valsartan  HCT 320/12.5 mg daily and Lasix  20 mg daily for hypertension, Crestor  10 mg daily and aspirin  81 mg daily for high cholesterol, and metformin  500 mg daily for prediabetes. He also takes a vitamin D  supplement of 50,000 units weekly. He does not monitor his blood sugar levels at home as he does not have a glucometer.  He is a nonsmoker and has gained weight since his last visit. 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       No Known Allergies  Past Medical History:  Diagnosis Date   Aortic atherosclerosis (HCC)    Family history of colon cancer    father -questionable   Hyperlipidemia    Hypertension     previous HTN with 400lb weight   Obesity    has been up to 400lb   Pneumonia    Prostate cancer (HCC) 2024   Wears glasses       Current Outpatient Medications:    aspirin  EC (ASPIRIN  LOW DOSE) 81 MG tablet, SWALLOW ONE WHOLE TABLET BY MOUTH DAILY AS DIRECTED, Disp: 30 tablet, Rfl: 0   furosemide  (LASIX ) 20 MG tablet, TAKE 1 TABLET BY MOUTH DAILY, Disp: 30 tablet, Rfl: 0   metFORMIN  (GLUCOPHAGE ) 500 MG tablet, TAKE 1 TABLET BY MOUTH DAILY WITH BREAKFAST, Disp: 30 tablet, Rfl: 0   potassium chloride  (KLOR-CON ) 10 MEQ tablet, TAKE 1 TABLET BY MOUTH DAILY, Disp: 30 tablet, Rfl: 0   rosuvastatin  (CRESTOR ) 10 MG tablet, TAKE 1 TABLET BY MOUTH DAILY, Disp: 30 tablet, Rfl: 0   tiZANidine  (ZANAFLEX ) 4 MG tablet, Take 1 tablet (4 mg total) by mouth 2 (two) times daily as needed for muscle spasms., Disp: 20 tablet, Rfl: 0   valsartan -hydrochlorothiazide  (DIOVAN -HCT) 320-12.5 MG tablet, TAKE 1 TABLET BY MOUTH DAILY, Disp: 30 tablet, Rfl: 0   Vitamin D , Ergocalciferol , (DRISDOL ) 1.25 MG (50000 UNIT) CAPS capsule, TAKE 1 CAPSULE BY MOUTH ONCE WEEKLY, Disp: 4 capsule, Rfl: 0  Family History  Problem Relation Age of Onset   Cancer Mother 62       metastases-unaware as to type of ca   Obesity  Mother    Colon cancer Father 52       questionable   Liver disease Father    Alcohol abuse Father    Heart disease Neg Hx    Hypertension Neg Hx    Hyperlipidemia Neg Hx    Stroke Neg Hx    Diabetes Neg Hx    Esophageal cancer Neg Hx    Rectal cancer Neg Hx     Past Surgical History:  Procedure Laterality Date   PROSTATE BIOPSY     unremarkable        OBJECTIVE:       01/11/2024   10:16 AM  Depression screen PHQ 2/9  Decreased Interest 0  Down, Depressed, Hopeless 0  PHQ - 2 Score 0     BP Readings from Last 3 Encounters:  01/11/24 110/70  09/28/23 (!) 163/90  12/21/22 132/86    BP 110/70   Ht 6' 2 (1.88 m)   Wt (!) 412 lb 3.2 oz (187 kg)   BMI 52.92 kg/m    Physical Exam       General appearence: alert, no distress, WD/WN, African American male HEENT: normocephalic, sclerae anicteric, PERRLA, EOMi, nares patent, no discharge or erythema, pharynx normal Oral cavity: MMM, no lesions Neck: supple, no lymphadenopathy, no thyromegaly, no masses, no bruits Heart: RRR, normal S1, S2, no murmurs Lungs: CTA bilaterally, no wheezes, rhonchi, or rales Abdomen: +bs, soft, non tender, non distended, no masses, no hepatomegaly, no splenomegaly Back: non tender Musculoskeletal: mild tenderness right lateral lumbar region, but no other tenderness, pain with ROM which is relatively full, nontender, no swelling, no obvious deformity Extremities: no edema, no cyanosis, no clubbing Pulses: 2+ symmetric, upper and lower extremities, normal cap refill Neurological: alert, oriented x 3, CN2-12 intact, strength normal upper extremities and lower extremities, sensation normal throughout, DTRs 2+ throughout, no cerebellar signs, gait normal Psychiatric: normal affect, behavior normal, pleasant  GU/rectal - decline/deferred   ASSESSMENT/PLAN:   Encounter Diagnoses  Name Primary?   Encounter for health maintenance examination in adult Yes   Needs flu shot    Essential hypertension, benign    Hyperlipidemia, unspecified hyperlipidemia type    Impaired fasting blood sugar    Vitamin D  deficiency    Venous insufficiency    Varicose veins of both lower extremities, unspecified whether complicated    Vaccine counseling    Screen for colon cancer    Chronic venous insufficiency    Aortic atherosclerosis (HCC)    History of prostate cancer    Acute right-sided low back pain, unspecified whether sciatica present    Back spasm        Prostate cancer Diagnosed with Gleason score 3+4=7. On leuprolide  injections. - Next leuprolide  injection due in November. - Follow-up with urologist Dr. Matilda.  Right lower back pain Significant pain with movement, affecting sleep.  Differential includes musculoskeletal strain or pressure-related pain. -You can use Tizanidine  muscle relaxer as needed once or twice daily for spasm and pain.   This can cause drowsiness.  - work on stretching, avoid heavy lifting  -If not improving in the next week, lets consider xray  Essential hypertension Blood pressure management ongoing. - Continue Valsartan  HCT 320/12.5 mg daily and Lasix  20 mg daily.  pre diabetes mellitus No glucometer use reported. Due for A1c testing. - Continue metformin  500 mg daily. - Order A1c test.  Hyperlipidemia Lipid management ongoing. - Continue Crestor  10 mg daily and aspirin  81 mg daily.  Obesity Weight gain  noted since last visit.  Aortic atherosclerosis Cholesterol buildup in the aorta noted on previous PET scan.  Vitamin D  deficiency - Continue vitamin D  supplement 50,000 units weekly. - Order vitamin D  level.  Adult Wellness Visit Discussed general health maintenance, including dental and eye care, and vaccinations. - Recommend annual dental and eye exams. - Administer flu vaccine today. - Discuss pneumococcal vaccine; he may choose to receive it today or at a later date.       This visit was a preventative care visit, also known as wellness visit or routine physical.   Topics typically include healthy lifestyle, diet, exercise, preventative care, vaccinations, sick and well care, proper use of emergency dept and after hours care, as well as other concerns.     Recommendations: Continue to return yearly for your annual wellness and preventative care visits.  This gives us  a chance to discuss healthy lifestyle, exercise, vaccinations, review your chart record, and perform screenings where appropriate.  I recommend you see your eye doctor yearly for routine vision care.  I recommend you see your dentist yearly for routine dental care including hygiene visits twice yearly.   Vaccination recommendations were  reviewed Immunization History  Administered Date(s) Administered   Influenza,inj,Quad PF,6+ Mos 04/11/2014, 03/30/2017   PPD Test 06/07/2012   Tdap 06/07/2012, 07/19/2022, 09/14/2022   Counseled on the influenza virus vaccine.  Vaccine information sheet given.  Influenza vaccine given after consent obtained.  Counseled on Shingrix , prevnar vaccines   Screening for cancer: Colon cancer screening: Declines for now.  No prior screening . He has a lot of fear related to diagnosis of prostate cancer last year.  Skin cancer screening: Check your skin regularly for new changes, growing lesions, or other lesions of concern Come in for evaluation if you have skin lesions of concern.  Lung cancer screening: If you have a greater than 20 pack year history of tobacco use, then you may qualify for lung cancer screening with a chest CT scan.   Please call your insurance company to inquire about coverage for this test.  We currently don't have screenings for other cancers besides breast, cervical, colon, and lung cancers.  If you have a strong family history of cancer or have other cancer screening concerns, please let me know.    Bone health: Get at least 150 minutes of aerobic exercise weekly Get weight bearing exercise at least once weekly Bone density test:  A bone density test is an imaging test that uses a type of X-ray to measure the amount of calcium  and other minerals in your bones. The test may be used to diagnose or screen you for a condition that causes weak or thin bones (osteoporosis), predict your risk for a broken bone (fracture), or determine how well your osteoporosis treatment is working. The bone density test is recommended for females 65 and older, or females or males <65 if certain risk factors such as thyroid disease, long term use of steroids such as for asthma or rheumatological issues, vitamin D  deficiency, estrogen deficiency, family history of osteoporosis, self or  family history of fragility fracture in first degree relative.    Heart health: Get at least 150 minutes of aerobic exercise weekly Limit alcohol It is important to maintain a healthy blood pressure and healthy cholesterol numbers  Heart disease screening: Screening for heart disease includes screening for blood pressure, fasting lipids, glucose/diabetes screening, BMI height to weight ratio, reviewed of smoking status, physical activity, and diet.  Goals include blood pressure 120/80 or less, maintaining a healthy lipid/cholesterol profile, preventing diabetes or keeping diabetes numbers under good control, not smoking or using tobacco products, exercising most days per week or at least 150 minutes per week of exercise, and eating healthy variety of fruits and vegetables, healthy oils, and avoiding unhealthy food choices like fried food, fast food, high sugar and high cholesterol foods.    Other tests may possibly include EKG test, CT coronary calcium  score, echocardiogram, exercise treadmill stress test.    Medical care options: I recommend you continue to seek care here first for routine care.  We try really hard to have available appointments Monday through Friday daytime hours for sick visits, acute visits, and physicals.  Urgent care should be used for after hours and weekends for significant issues that cannot wait till the next day.  The emergency department should be used for significant potentially life-threatening emergencies.  The emergency department is expensive, can often have long wait times for less significant concerns, so try to utilize primary care, urgent care, or telemedicine when possible to avoid unnecessary trips to the emergency department.  Virtual visits and telemedicine have been introduced since the pandemic started in 2020, and can be convenient ways to receive medical care.  We offer virtual appointments as well to assist you in a variety of options to seek medical  care.   Tobby was seen today for annual exam.  Diagnoses and all orders for this visit:  Encounter for health maintenance examination in adult -     CBC -     Comprehensive metabolic panel with GFR -     Lipid panel -     TSH -     Hemoglobin A1c -     Urinalysis, Routine w reflex microscopic -     VITAMIN D  25 Hydroxy (Vit-D Deficiency, Fractures)  Needs flu shot -     Flu vaccine HIGH DOSE PF(Fluzone Trivalent)  Essential hypertension, benign -     Comprehensive metabolic panel with GFR  Hyperlipidemia, unspecified hyperlipidemia type -     Lipid panel  Impaired fasting blood sugar -     Hemoglobin A1c  Vitamin D  deficiency -     VITAMIN D  25 Hydroxy (Vit-D Deficiency, Fractures)  Venous insufficiency  Varicose veins of both lower extremities, unspecified whether complicated  Vaccine counseling  Screen for colon cancer  Chronic venous insufficiency  Aortic atherosclerosis (HCC)  History of prostate cancer  Acute right-sided low back pain, unspecified whether sciatica present  Back spasm  Other orders -     tiZANidine  (ZANAFLEX ) 4 MG tablet; Take 1 tablet (4 mg total) by mouth 2 (two) times daily as needed for muscle spasms.   Follow-up pending labs, yearly for physical      [facility]

## 2024-01-12 ENCOUNTER — Other Ambulatory Visit: Payer: Self-pay | Admitting: Medical

## 2024-01-12 ENCOUNTER — Ambulatory Visit: Payer: Self-pay | Admitting: Medical

## 2024-01-12 LAB — CBC
Hematocrit: 35.8 % — ABNORMAL LOW (ref 37.5–51.0)
Hemoglobin: 11.9 g/dL — ABNORMAL LOW (ref 13.0–17.7)
MCH: 31.4 pg (ref 26.6–33.0)
MCHC: 33.2 g/dL (ref 31.5–35.7)
MCV: 95 fL (ref 79–97)
Platelets: 222 x10E3/uL (ref 150–450)
RBC: 3.79 x10E6/uL — ABNORMAL LOW (ref 4.14–5.80)
RDW: 12.5 % (ref 11.6–15.4)
WBC: 4.9 x10E3/uL (ref 3.4–10.8)

## 2024-01-12 LAB — URINALYSIS, ROUTINE W REFLEX MICROSCOPIC
Specific Gravity, UA: 1.009 (ref 1.005–1.030)
Urobilinogen, Ur: 0.2 mg/dL (ref 0.2–1.0)
pH, UA: 6 (ref 5.0–7.5)

## 2024-01-12 LAB — COMPREHENSIVE METABOLIC PANEL WITH GFR
ALT: 16 IU/L (ref 0–44)
AST: 18 IU/L (ref 0–40)
Albumin: 4.4 g/dL (ref 3.9–4.9)
Alkaline Phosphatase: 92 IU/L (ref 44–121)
BUN/Creatinine Ratio: 15 (ref 10–24)
BUN: 16 mg/dL (ref 8–27)
Bilirubin Total: 0.5 mg/dL (ref 0.0–1.2)
CO2: 19 mmol/L — AB (ref 20–29)
Calcium: 9.8 mg/dL (ref 8.6–10.2)
Chloride: 104 mmol/L (ref 96–106)
Creatinine, Ser: 1.06 mg/dL (ref 0.76–1.27)
Globulin, Total: 3.3 g/dL (ref 1.5–4.5)
Glucose: 104 mg/dL — AB (ref 70–99)
Potassium: 4.1 mmol/L (ref 3.5–5.2)
Sodium: 140 mmol/L (ref 134–144)
Total Protein: 7.7 g/dL (ref 6.0–8.5)
eGFR: 78 mL/min/1.73 (ref >59)

## 2024-01-12 LAB — LIPID PANEL
Cholesterol, Total: 158 mg/dL (ref 100–199)
HDL: 68 mg/dL (ref >39)
LDL CALC COMMENT:: 2.3 ratio (ref 0.0–5.0)
LDL Chol Calc (NIH): 69 mg/dL (ref 0–99)
Triglycerides: 120 mg/dL (ref 0–149)
VLDL Cholesterol Cal: 21 mg/dL (ref 5–40)

## 2024-01-12 LAB — TSH: TSH: 2.66 u[IU]/mL (ref 0.450–4.500)

## 2024-01-12 LAB — HEMOGLOBIN A1C
Est. average glucose Bld gHb Est-mCnc: 126 mg/dL
Hgb A1c MFr Bld: 6 % — ABNORMAL HIGH (ref 4.8–5.6)

## 2024-01-12 LAB — VITAMIN D 25 HYDROXY (VIT D DEFICIENCY, FRACTURES): Vit D, 25-Hydroxy: 36.3 ng/mL (ref 30.0–100.0)

## 2024-01-12 MED ORDER — ROSUVASTATIN CALCIUM 10 MG PO TABS: 10.0000 mg | ORAL_TABLET | Freq: Every day | ORAL | 2 refills | Status: AC

## 2024-01-12 MED ORDER — VALSARTAN-HYDROCHLOROTHIAZIDE 320-12.5 MG PO TABS: 1.0000 | ORAL_TABLET | Freq: Every day | ORAL | 2 refills | Status: AC

## 2024-01-12 MED ORDER — FERROUS GLUCONATE 324 (38 FE) MG PO TABS: 324.0000 mg | ORAL_TABLET | Freq: Every day | ORAL | 0 refills | Status: DC

## 2024-01-12 MED ORDER — ASPIRIN 81 MG PO TBEC: 81.0000 mg | DELAYED_RELEASE_TABLET | Freq: Every day | ORAL | 2 refills | Status: AC

## 2024-01-12 MED ORDER — VITAMIN D (ERGOCALCIFEROL) 1.25 MG (50000 UNIT) PO CAPS: 50000.0000 [IU] | ORAL_CAPSULE | ORAL | 2 refills | Status: AC

## 2024-01-12 MED ORDER — FUROSEMIDE 20 MG PO TABS: 20.0000 mg | ORAL_TABLET | Freq: Every day | ORAL | 2 refills | Status: AC

## 2024-01-12 MED ORDER — METFORMIN HCL 500 MG PO TABS: 500.0000 mg | ORAL_TABLET | Freq: Every day | ORAL | 2 refills | Status: AC

## 2024-01-12 MED ORDER — MULTIVITAMIN ADULT PO TABS: 1.0000 | ORAL_TABLET | Freq: Every day | ORAL | 3 refills | Status: AC

## 2024-01-12 MED ORDER — POTASSIUM CHLORIDE ER 10 MEQ PO TBCR: 10.0000 meq | EXTENDED_RELEASE_TABLET | Freq: Every day | ORAL | 2 refills | Status: AC

## 2024-01-12 NOTE — Progress Notes (Signed)
 Labs showed vitamin D  okay, thyroid okay, cholesterol looks pretty good, diabetes marker stable at 6.0%, liver and electrolytes and kidney okay.  You are a little bit anemic though low hemoglobin.  Given the anemia it is typically recommended to have evaluation by gastroenterology for colonoscopy to look for any sources of bleeding or blood loss.  If agreeable I will place that referral  I recommend taking a multivitamin daily and consider taking an over-the-counter iron tablet once daily.  Continue your current medications.  Plan to follow-up in 4 months

## 2024-01-13 ENCOUNTER — Telehealth: Payer: Self-pay

## 2024-01-13 NOTE — Telephone Encounter (Signed)
 Copied from CRM [id number]. Topic: Clinical - Lab/Test Results >> Jan 13, 2024  1:21 PM Santiya F wrote: Reason for CRM: Patient is calling in because he received his lab results yesterday and doesn't have any questions at this time. Patient says he will get back to the office regarding the colonoscopy.

## 2024-01-23 ENCOUNTER — Ambulatory Visit: Payer: Self-pay

## 2024-01-23 NOTE — Telephone Encounter (Signed)
 Patient requesting pain medication until appt on Wed. Confirmed NKDA and preferred pharmacy. States muscle relaxer previously prescribed did not provide relief.  FYI Only or Action Required?: Action required by provider: clinical question for provider.  Patient was last seen in primary care on 01/11/2024 by Bulah Alm RAMAN, PA-C.  Called Nurse Triage reporting Pain.  Symptoms began several months ago.  Interventions attempted: Prescription medications: muscle relaxer.  Symptoms are: gradually worsening.  Triage Disposition: See Within 3 Days in Office (overriding See HCP Within 4 Hours (Or PCP Triage))  Patient/caregiver understands and will follow disposition?:  Reason for Disposition  [1] SEVERE pain (e.g., excruciating, unable to do any normal activities) AND [2] not improved 2 hours after pain medicine  Answer Assessment - Initial Assessment Questions Prescribed muscle relaxer for same in Sept. States PCP asked him to call if no improvement. Denies recent trauma. Denies dysuria over than baseline/had tx for prostate. Denies change in urine color. Offered same day visit at alternate location, pt preferred to be seen in PCP office Wed.   1. ONSET: When did the muscle aches or body pains start?      Late October  2. LOCATION: What part of your body is hurting? (e.g., entire body, arms, legs)      Right side pain, states above hip  3. SEVERITY: How bad is the pain? (Scale 1-10; or mild, moderate, severe)     Moderate-severe  4. CAUSE: What do you think is causing the pains?     Unknown, possibly new bed  5. FEVER: Do you have a fever? If Yes, ask: What is your temperature, how was it measured, and  when did it start?      Denies  Protocols used: Muscle Aches and Body Pain-A-AH Copied from CRM [id number]. Topic: Clinical - Red Word Triage >> Jan 23, 2024  9:33 AM Leonette SQUIBB wrote: Red Word that prompted transfer to Nurse Triage: rt side pain , not able to sleep

## 2024-01-24 ENCOUNTER — Other Ambulatory Visit: Payer: Self-pay | Admitting: Medical

## 2024-01-24 MED ORDER — HYDROCODONE-ACETAMINOPHEN 5-325 MG PO TABS: 1.0000 | ORAL_TABLET | Freq: Two times a day (BID) | ORAL | 0 refills | Status: DC | PRN

## 2024-01-24 NOTE — Telephone Encounter (Signed)
 Pt was notified.

## 2024-01-25 ENCOUNTER — Ambulatory Visit: Admitting: Medical

## 2024-01-25 VITALS — BP 132/88 | HR 65 | Wt >= 6400 oz

## 2024-01-25 DIAGNOSIS — T148XXA Other injury of unspecified body region, initial encounter: Secondary | ICD-10-CM

## 2024-01-25 DIAGNOSIS — S39012D Strain of muscle, fascia and tendon of lower back, subsequent encounter: Secondary | ICD-10-CM

## 2024-01-25 DIAGNOSIS — M545 Low back pain, unspecified: Secondary | ICD-10-CM | POA: Diagnosis not present

## 2024-01-25 MED ORDER — NAPROXEN 500 MG PO TABS: 500.0000 mg | ORAL_TABLET | Freq: Two times a day (BID) | ORAL | 0 refills | Status: AC

## 2024-01-25 NOTE — Progress Notes (Signed)
 Subjective:  [name]. is a 68 y.o. male who presents for Chief Complaint  Patient presents with   Acute Visit    Side pain. Was seen on 9/10, started a couple week piror to that. Aching pain.      Here for right low back pain/side pain.  Been hurting a few weeks now.   No radiation down leg.  Hurts worse sitting or lying.  Stays in the back.   No pain in buttock.   No numbness or tingling.  No weakness in legs . No fever.  No urinary or gastrointestinal symptoms.   Last visit muscle relaxer prescribed and that didn't help.  Norco called out yesterday but he hasn't tried that yet.     He recalls cutting grass and doing lawn work ,but no other injury, trauma or fall.  Currently sees urology on androgen deprivation therapy.  Sees them again in 03/2024.  No other aggravating or relieving factors.    No other c/o.   Past Medical History:  Diagnosis Date   Aortic atherosclerosis    Family history of colon cancer    father -questionable   Hyperlipidemia    Hypertension    previous HTN with 400lb weight   Obesity    has been up to 400lb   Pneumonia    Prostate cancer (HCC) 2024   Wears glasses    Current Outpatient Medications on File Prior to Visit  Medication Sig Dispense Refill   aspirin  EC (ASPIRIN  LOW DOSE) 81 MG tablet Take 1 tablet (81 mg total) by mouth daily. Swallow whole. 90 tablet 2   ferrous gluconate  (FERGON) 324 MG tablet Take 1 tablet (324 mg total) by mouth daily with breakfast. 90 tablet 0   furosemide  (LASIX ) 20 MG tablet Take 1 tablet (20 mg total) by mouth daily. 90 tablet 2   HYDROcodone -acetaminophen  (NORCO/VICODIN) 5-325 MG tablet Take 1 tablet by mouth 2 (two) times daily as needed for moderate pain (pain score 4-6). 12 tablet 0   metFORMIN  (GLUCOPHAGE ) 500 MG tablet Take 1 tablet (500 mg total) by mouth daily with breakfast. 90 tablet 2   Multiple Vitamin (MULTIVITAMIN ADULT) TABS Take 1 tablet by mouth daily. 90 tablet 3   potassium chloride   (KLOR-CON ) 10 MEQ tablet Take 1 tablet (10 mEq total) by mouth daily. 90 tablet 2   rosuvastatin  (CRESTOR ) 10 MG tablet Take 1 tablet (10 mg total) by mouth daily. 90 tablet 2   tiZANidine  (ZANAFLEX ) 4 MG tablet Take 1 tablet (4 mg total) by mouth 2 (two) times daily as needed for muscle spasms. 20 tablet 0   valsartan -hydrochlorothiazide  (DIOVAN -HCT) 320-12.5 MG tablet Take 1 tablet by mouth daily. 90 tablet 2   Vitamin D , Ergocalciferol , (DRISDOL ) 1.25 MG (50000 UNIT) CAPS capsule Take 1 capsule (50,000 Units total) by mouth once a week. 4 capsule 2   No current facility-administered medications on file prior to visit.   Past Surgical History:  Procedure Laterality Date   PROSTATE BIOPSY     unremarkable       The following portions of the patient's history were reviewed and updated as appropriate: allergies, current medications, past family history, past medical history, past social history, past surgical history and problem list.  ROS Otherwise as in subjective above  Objective: BP 132/88   Pulse 65   Wt (!) 413 lb 3.2 oz (187.4 kg)   SpO2 97%   BMI 53.05 kg/m   General appearance: alert, no distress, well developed,  well nourished Tender right lateral lumbar spine paraspinal region and lateral oblique, otherwise nontender, flexion to 100 degrees, ext normal, no swelling or deformity Abdomen nontender, no mass, no organomegaly Legs nontender with relative normal ROM Legs neurovascularly intact   Assessment: Encounter Diagnoses  Name Primary?   Right-sided low back pain without sciatica, unspecified chronicity Yes   Muscle strain    Back strain, subsequent encounter      Plan: Use the stretches we discussed daily such as back flexion, extension, rotation, leg raises, leg stretches, pretzel stretch  Begin Naprosyn  1 tablet twice daily for 7 days  Use your tizanidine  muscle relaxer at night the next 3-4 nights, then as needed  Use the Norco /hydrocodone  for  breakthrough pain  If not much improved in the next 10-12 days, consider xray, massage, or physical therapy referral  Please go to [facility] for your back xray.   Their hours are 8am - 4:30 pm Monday - Friday.  Take your insurance card with you.  [facility] [phone]   [address] [place], [place] [address]   [name] was seen today for acute visit.  Diagnoses and all orders for this visit:  Right-sided low back pain without sciatica, unspecified chronicity -     DG Lumbar Spine 2-3 Views  Muscle strain -     DG Lumbar Spine 2-3 Views  Back strain, subsequent encounter -     DG Lumbar Spine 2-3 Views  Other orders -     naproxen  (NAPROSYN ) 500 MG tablet; Take 1 tablet (500 mg total) by mouth 2 (two) times daily with a meal.    Follow up: prn

## 2024-01-25 NOTE — Patient Instructions (Signed)
 Use the stretches we discussed daily such as back flexion, extension, rotation, leg raises, leg stretches, pretzel stretch  Begin Naprosyn  1 tablet twice daily for 7 days  Use your tizanidine  muscle relaxer at night the next 3-4 nights, then as needed  Use the Norco /hydrocodone  for breakthrough pain  If not much improved in the next 10-12 days, consider xray, massage, or physical therapy referral  Please go to [name] for your back xray.   Their hours are 8am - 4:30 pm Monday - Friday.  Take your insurance card with you.  [facility] [phone]   [address] [place], [place] [address]

## 2024-01-31 ENCOUNTER — Other Ambulatory Visit: Payer: Self-pay | Admitting: Medical

## 2024-01-31 ENCOUNTER — Ambulatory Visit: Payer: Self-pay | Admitting: Medical

## 2024-01-31 ENCOUNTER — Ambulatory Visit
Admission: RE | Admit: 2024-01-31 | Discharge: 2024-01-31 | Disposition: A | Discharge status: Home / Self Care | Source: Ambulatory Visit | Attending: Medical | Admitting: Medical

## 2024-01-31 DIAGNOSIS — M549 Dorsalgia, unspecified: Secondary | ICD-10-CM

## 2024-01-31 DIAGNOSIS — R262 Difficulty in walking, not elsewhere classified: Secondary | ICD-10-CM

## 2024-01-31 DIAGNOSIS — M5410 Radiculopathy, site unspecified: Secondary | ICD-10-CM

## 2024-01-31 NOTE — Progress Notes (Signed)
 Lumbar spine x-ray shows mild degenerative changes in the lumbar spine and facet degenerative age-related changes as well  I would use the anti-inflammatory we discussed, stretching we discussed, and if agreeable referral to physical therapy

## 2024-02-09 ENCOUNTER — Other Ambulatory Visit: Payer: Self-pay | Admitting: Medical

## 2024-02-09 MED ORDER — HYDROCODONE-ACETAMINOPHEN 5-325 MG PO TABS: 1.0000 | ORAL_TABLET | Freq: Two times a day (BID) | ORAL | 0 refills | Status: AC | PRN

## 2024-02-09 NOTE — Telephone Encounter (Signed)
 Copied from CRM [id number]. Topic: Clinical - Medication Refill >> Feb 09, 2024  2:54 PM Rachelle R wrote: Medication: HYDROcodone -acetaminophen  (NORCO/VICODIN) 5-325 MG tablet  Has the patient contacted their pharmacy? Yes, call dr  This is the patient's preferred pharmacy:  [facility] [id number] - [place], [place] - [address] [place] [address] Phone: [phone] Fax: [phone]  Is this the correct pharmacy for this prescription? Yes If no, delete pharmacy and type the correct one.   Has the prescription been filled recently? No  Is the patient out of the medication? Yes  Has the patient been seen for an appointment in the last year OR does the patient have an upcoming appointment? Yes  Can we respond through MyChart? No  Agent: Please be advised that Rx refills may take up to 3 business days. We ask that you follow-up with your pharmacy.

## 2024-02-12 ENCOUNTER — Ambulatory Visit
Admission: RE | Admit: 2024-02-12 | Discharge: 2024-02-12 | Disposition: A | Discharge status: Home / Self Care | Source: Ambulatory Visit | Attending: Medical | Admitting: Medical

## 2024-02-12 DIAGNOSIS — M549 Dorsalgia, unspecified: Secondary | ICD-10-CM

## 2024-02-12 DIAGNOSIS — R262 Difficulty in walking, not elsewhere classified: Secondary | ICD-10-CM

## 2024-02-12 DIAGNOSIS — M5410 Radiculopathy, site unspecified: Secondary | ICD-10-CM

## 2024-02-15 ENCOUNTER — Other Ambulatory Visit: Payer: Self-pay | Admitting: Medical

## 2024-02-15 ENCOUNTER — Other Ambulatory Visit: Payer: Self-pay | Admitting: Internal Medicine

## 2024-02-15 ENCOUNTER — Ambulatory Visit: Payer: Self-pay | Admitting: Medical

## 2024-02-15 DIAGNOSIS — M48061 Spinal stenosis, lumbar region without neurogenic claudication: Secondary | ICD-10-CM

## 2024-02-15 DIAGNOSIS — M5410 Radiculopathy, site unspecified: Secondary | ICD-10-CM

## 2024-02-15 DIAGNOSIS — M549 Dorsalgia, unspecified: Secondary | ICD-10-CM

## 2024-02-15 DIAGNOSIS — R262 Difficulty in walking, not elsewhere classified: Secondary | ICD-10-CM

## 2024-02-15 NOTE — Progress Notes (Signed)
 Results thru my chart

## 2024-03-21 ENCOUNTER — Other Ambulatory Visit

## 2024-03-21 ENCOUNTER — Other Ambulatory Visit: Payer: Self-pay

## 2024-03-21 DIAGNOSIS — C61 Malignant neoplasm of prostate: Secondary | ICD-10-CM

## 2024-03-24 LAB — TESTOSTERONE: Testosterone: 22 ng/dL — ABNORMAL LOW (ref 264–916)

## 2024-03-24 LAB — PSA

## 2024-03-26 NOTE — Progress Notes (Signed)
   Assessment: Grade group 4 prostate cancer.  He has is doing well from his cancer standpoint.  PSA pending.  Plan: -Last 106-month leuprolide  administered today  -Visit here in 3 months following laboratories   HPI: [name]. is a 68 y.o. male who presents for continued evaluation of prostate cancer.  Patient states that he is doing very well tolerating ADT without much difficulty.  He does have some mild hot flashes but tolerates them well.   Prostate cancer summary: Initial diagnosis 07/2022-TRUS/BX PreTx PSA:  45 Pathology: 5/8 cores positive (bilateral involvement) with large volume Gleason 3+4= 7 (8 core biopsy performed due to patient poorly tolerating procedure and very small gland (15ml)   DRE:  ? Difficult to feel due to body habitus IPSS= 2 Fam hx:  possible in father/uncertain   PSMA PET 08/06/2022 IMPRESSION: 1. Increased radiotracer uptake within the right side of the prostate gland compatible with primary prostate neoplasm. 2. 8 mm, right pelvic sidewall lymph node has an SUV max of 3.32. Cannot exclude early nodal metastasis. No additional tracer avid lymph nodes identified. 3. No signs of solid organ metastasis or distant metastatic disease. 4. Aortic Atherosclerosis (ICD10-I70.0).   PSA DATA: 06/2012             0.94 06/2013             2.79 01/2015             5.6 (5% free) 06/2016             6.6 03/2021           28.8 06/2022             45.3 (4.5% free) 12/24/2022  completed RT (40 IMRT treatments)  5.28.2025: Here today for routine check/leuprolide  administration.  Having nonbothersome hot flashes.  Received  Lupron  31-month injection  11.26.2025: Here today for recheck.  He has had no lower urinary tract symptomatology.  No blood in the stool or urine.  Administered to his last 59-month leuprolide  today.  Allergies: No Known Allergies  PMH: Past Medical History:  Diagnosis Date   Aortic atherosclerosis    Family history of colon cancer     father -questionable   Hyperlipidemia    Hypertension    previous HTN with 400lb weight   Obesity    has been up to 400lb   Pneumonia    Prostate cancer (HCC) 2024   Wears glasses     PSH: Past Surgical History:  Procedure Laterality Date   PROSTATE BIOPSY     unremarkable      SH: Social History   Tobacco Use   Smoking status: Never   Smokeless tobacco: Never  Vaping Use   Vaping status: Never Used  Substance Use Topics   Alcohol use: Yes    Comment: occasional only   Drug use: No   PSA/testosterone  results reviewed  Pathology reviewed  Urinalysis clear

## 2024-03-28 ENCOUNTER — Ambulatory Visit (INDEPENDENT_AMBULATORY_CARE_PROVIDER_SITE_OTHER): Admitting: Urology

## 2024-03-28 ENCOUNTER — Other Ambulatory Visit: Payer: Self-pay

## 2024-03-28 VITALS — BP 161/65 | HR 92 | Ht 75.0 in | Wt 390.0 lb

## 2024-03-28 DIAGNOSIS — C61 Malignant neoplasm of prostate: Secondary | ICD-10-CM

## 2024-03-28 DIAGNOSIS — Z79899 Other long term (current) drug therapy: Secondary | ICD-10-CM

## 2024-03-28 DIAGNOSIS — E895 Postprocedural testicular hypofunction: Secondary | ICD-10-CM

## 2024-03-28 DIAGNOSIS — Z79818 Long term (current) use of other agents affecting estrogen receptors and estrogen levels: Secondary | ICD-10-CM

## 2024-03-28 DIAGNOSIS — R7989 Other specified abnormal findings of blood chemistry: Secondary | ICD-10-CM

## 2024-03-28 LAB — URINALYSIS, ROUTINE W REFLEX MICROSCOPIC
Bilirubin, UA: NEGATIVE
Glucose, UA: NEGATIVE
Ketones, UA: NEGATIVE
Leukocytes,UA: NEGATIVE
Nitrite, UA: NEGATIVE
Protein,UA: NEGATIVE
Specific Gravity, UA: 1.02 (ref 1.005–1.030)
Urobilinogen, Ur: 1 mg/dL (ref 0.2–1.0)
pH, UA: 6.5 (ref 5.0–7.5)

## 2024-03-28 LAB — MICROSCOPIC EXAMINATION

## 2024-03-28 MED ORDER — LEUPROLIDE ACETATE (6 MONTH) 45 MG ~~LOC~~ KIT
45.0000 mg | PACK | Freq: Once | SUBCUTANEOUS | Status: AC
  Administered 2024-03-28: 45 mg via SUBCUTANEOUS

## 2024-03-28 NOTE — Addendum Note (Signed)
 Addended by: KOLEEN CREE C on: 03/28/2024 11:15 AM   Modules accepted: Orders

## 2024-03-28 NOTE — Addendum Note (Signed)
 Addended by: KOLEEN CREE C on: 03/28/2024 11:34 AM   Modules accepted: Orders

## 2024-03-28 NOTE — Progress Notes (Signed)
 Eligard  SubQ Injection   Due to Prostate Cancer patient is present today for a Eligard  Injection.  Medication: Eligard  6 month Dose: 45 mg  Location: left  NDC: 37064-538-49 Lot: [id number] Exp: 04/2025  Patient tolerated well, no complications were noted  Performed by: C. Koleen, LPN

## 2024-04-18 LAB — PSA: Prostate Specific Ag, Serum: 0.3 ng/mL (ref 0.0–4.0)

## 2024-04-20 ENCOUNTER — Ambulatory Visit: Payer: Self-pay | Admitting: Urology

## 2024-05-01 ENCOUNTER — Other Ambulatory Visit: Payer: Self-pay | Admitting: Medical

## 2024-05-15 ENCOUNTER — Ambulatory Visit

## 2024-06-27 ENCOUNTER — Ambulatory Visit: Admitting: Urology

## 2025-01-14 ENCOUNTER — Encounter: Payer: Self-pay | Admitting: Medical
